# Patient Record
Sex: Female | Born: 1980
Health system: Southern US, Community
[De-identification: ages and names within clinical notes are randomized; demographics above are authoritative.]

## PROBLEM LIST (undated history)

## (undated) DIAGNOSIS — E039 Hypothyroidism, unspecified: Secondary | ICD-10-CM

## (undated) DIAGNOSIS — E119 Type 2 diabetes mellitus without complications: Secondary | ICD-10-CM

## (undated) DIAGNOSIS — F99 Mental disorder, not otherwise specified: Secondary | ICD-10-CM

## (undated) DIAGNOSIS — F329 Major depressive disorder, single episode, unspecified: Secondary | ICD-10-CM

## (undated) DIAGNOSIS — Z7901 Long term (current) use of anticoagulants: Secondary | ICD-10-CM

## (undated) DIAGNOSIS — Z862 Personal history of diseases of the blood and blood-forming organs and certain disorders involving the immune mechanism: Secondary | ICD-10-CM

## (undated) DIAGNOSIS — I10 Essential (primary) hypertension: Secondary | ICD-10-CM

## (undated) DIAGNOSIS — M1A9XX0 Chronic gout, unspecified, without tophus (tophi): Secondary | ICD-10-CM

## (undated) DIAGNOSIS — Z973 Presence of spectacles and contact lenses: Secondary | ICD-10-CM

## (undated) DIAGNOSIS — F4310 Post-traumatic stress disorder, unspecified: Secondary | ICD-10-CM

## (undated) DIAGNOSIS — F32A Depression, unspecified: Secondary | ICD-10-CM

## (undated) DIAGNOSIS — E785 Hyperlipidemia, unspecified: Secondary | ICD-10-CM

## (undated) HISTORY — DX: Essential (primary) hypertension: I10

## (undated) HISTORY — DX: Hypothyroidism, unspecified: E03.9

## (undated) HISTORY — DX: Type 2 diabetes mellitus without complications: E11.9

## (undated) HISTORY — DX: Mental disorder, not otherwise specified: F99

## (undated) HISTORY — DX: Hyperlipidemia, unspecified: E78.5

## (undated) HISTORY — DX: Depression, unspecified: F32.A

---
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
## 2006-06-07 ENCOUNTER — Emergency Department (HOSPITAL_COMMUNITY): Admission: EM | Admit: 2006-06-07 | Discharge: 2006-06-07 | Payer: Self-pay | Admitting: Emergency Medicine

## 2006-12-22 ENCOUNTER — Ambulatory Visit (HOSPITAL_COMMUNITY): Admission: RE | Admit: 2006-12-22 | Discharge: 2006-12-22 | Payer: Self-pay | Admitting: Obstetrics and Gynecology

## 2007-04-20 ENCOUNTER — Inpatient Hospital Stay (HOSPITAL_COMMUNITY): Admission: AD | Admit: 2007-04-20 | Discharge: 2007-04-23 | Payer: Self-pay | Admitting: Obstetrics & Gynecology

## 2007-04-20 ENCOUNTER — Ambulatory Visit: Payer: Self-pay | Admitting: Advanced Practice Midwife

## 2010-06-11 ENCOUNTER — Other Ambulatory Visit: Admission: RE | Admit: 2010-06-11 | Discharge: 2010-06-11 | Payer: Self-pay | Admitting: Obstetrics and Gynecology

## 2010-12-10 NOTE — Discharge Summary (Signed)
NAMEALLANNAH, KEMPEN             ACCOUNT NO.:  [id number]   MEDICAL RECORD NO.:  [id number]          PATIENT TYPE:  INP   LOCATION:  9167                          FACILITY:  [facility]   PHYSICIAN:  Allie Bossier, MD        DATE OF BIRTH:  1981-03-27   DATE OF ADMISSION:  04/20/2007  DATE OF DISCHARGE:                               DISCHARGE SUMMARY   CHIEF COMPLAINT:  Induction of labor secondary to gestational  hypertension.   HISTORY OF PRESENT ILLNESS:  April Carney is a 30 year old gravida 1, para 0  with an EDC of April 27, 2007, based on second trimester  ultrasounds, placing her at [redacted] weeks gestation.  She began prenatal care  at 17 weeks and has regular visits since then.   Prenatal labs include blood type A+, antibody negative, Rubella immune,  HBSAG, HIV, RPR, gonorrhea, Chlamydia, and HSV are all negative.  She is  Group B strep positive.  A one-hour GGT was normal at 126.  MFAFP was  also normal.   Blood pressures prenatally have been 110s-120s/70s-80s.  Total weight  gain has been from 251 to 290 with an elevated fundal height of 42 cm  today.   PAST MEDICAL HISTORY:  Noncontributory.   ALLERGIES:  None.   No medications except for iron daily.   SOCIAL HISTORY:  Single.  Works at [employer].  Denies smoking, drug use,  or alcohol use.  Father of the baby is present and involved.   FAMILY HISTORY:  Positive for hypertension, diabetes, coronary artery  disease, and cancer.   PHYSICAL EXAMINATION:  HEENT:  Within normal limits.  Denies visual  changes or disturbances.  HEART:  Regular rate and rhythm.  LUNGS:  Clear.  ABDOMEN:  Soft and nontender.  Fundal height is 42 cm.  NST was  reactive.  PELVIC:  Cervical, 2 cm soft, -1 station.  LEGS:  Edema 2+.  DTRs are 2+.  Blood pressures 170/108, 172/104.   Urinalysis reveals trace protein.   IMPRESSION:  Intrauterine pregnancy at 39 weeks, gestational  hypertension.   PLAN:  Will draw preeclampsia labs and induce  labor.  A Foley bulb was  placed in the cervix and inflated with 60 cc of sterile water.  A  reactive non-stress test was obtained.  The patient was instructed to go  to labor and delivery in four hours, where we will start GBS prophylaxis  and Pitocin.  If her blood pressures are still elevated, we will also  begin magnesium sulfate prophylaxis.      Jacklyn Shell, C.N.M.      Allie Bossier, MD  Electronically Signed    FC/MEDQ  D:  04/20/2007  T:  04/20/2007  Job:  [id number]   cc:   Francoise Schaumann. Milford Cage DO, FAAP  Fax: [phone]

## 2011-05-08 LAB — CBC
HCT: 32.6 — ABNORMAL LOW
Hemoglobin: 11.2 — ABNORMAL LOW
Hemoglobin: 9.4 — ABNORMAL LOW
MCHC: 34.2
MCV: 82
Platelets: 344
RBC: 3.98
RDW: 20.4 — ABNORMAL HIGH
WBC: 12.1 — ABNORMAL HIGH

## 2011-05-08 LAB — RPR: RPR Ser Ql: NONREACTIVE

## 2011-05-08 LAB — COMPREHENSIVE METABOLIC PANEL
Albumin: 2.8 — ABNORMAL LOW
Alkaline Phosphatase: 139 — ABNORMAL HIGH
BUN: 4 — ABNORMAL LOW
CO2: 21
Chloride: 109
GFR calc non Af Amer: >60
Glucose, Bld: 92
Potassium: 4.6
Total Bilirubin: 0.9

## 2011-08-08 ENCOUNTER — Other Ambulatory Visit (HOSPITAL_COMMUNITY)
Admission: RE | Admit: 2011-08-08 | Discharge: 2011-08-08 | Disposition: A | Discharge status: Home / Self Care | Payer: Medicaid Other | Source: Ambulatory Visit | Attending: Obstetrics and Gynecology | Admitting: Obstetrics and Gynecology

## 2011-08-08 DIAGNOSIS — Z113 Encounter for screening for infections with a predominantly sexual mode of transmission: Secondary | ICD-10-CM | POA: Insufficient documentation

## 2011-08-08 DIAGNOSIS — Z01419 Encounter for gynecological examination (general) (routine) without abnormal findings: Secondary | ICD-10-CM | POA: Insufficient documentation

## 2012-05-06 ENCOUNTER — Inpatient Hospital Stay (HOSPITAL_COMMUNITY)
Admission: AD | Admit: 2012-05-06 | Payer: Medicaid Other | Source: Home / Self Care | Admitting: Obstetrics & Gynecology

## 2013-12-23 ENCOUNTER — Emergency Department (HOSPITAL_COMMUNITY): Payer: Medicaid Other

## 2013-12-23 ENCOUNTER — Encounter (HOSPITAL_COMMUNITY): Payer: Self-pay | Admitting: Emergency Medicine

## 2013-12-23 ENCOUNTER — Emergency Department (HOSPITAL_COMMUNITY)
Admission: EM | Admit: 2013-12-23 | Discharge: 2013-12-23 | Disposition: A | Discharge status: Home / Self Care | Payer: Medicaid Other | Attending: Emergency Medicine | Admitting: Emergency Medicine

## 2013-12-23 DIAGNOSIS — Z3202 Encounter for pregnancy test, result negative: Secondary | ICD-10-CM | POA: Insufficient documentation

## 2013-12-23 DIAGNOSIS — Z862 Personal history of diseases of the blood and blood-forming organs and certain disorders involving the immune mechanism: Secondary | ICD-10-CM | POA: Insufficient documentation

## 2013-12-23 DIAGNOSIS — E119 Type 2 diabetes mellitus without complications: Secondary | ICD-10-CM | POA: Insufficient documentation

## 2013-12-23 DIAGNOSIS — Z8639 Personal history of other endocrine, nutritional and metabolic disease: Secondary | ICD-10-CM | POA: Insufficient documentation

## 2013-12-23 DIAGNOSIS — I252 Old myocardial infarction: Secondary | ICD-10-CM | POA: Insufficient documentation

## 2013-12-23 DIAGNOSIS — R002 Palpitations: Secondary | ICD-10-CM

## 2013-12-23 DIAGNOSIS — R42 Dizziness and giddiness: Secondary | ICD-10-CM | POA: Insufficient documentation

## 2013-12-23 HISTORY — DX: Hypothyroidism, unspecified: E03.9

## 2013-12-23 LAB — URINALYSIS, ROUTINE W REFLEX MICROSCOPIC
Bilirubin Urine: NEGATIVE
GLUCOSE, UA: NEGATIVE mg/dL
Hgb urine dipstick: NEGATIVE
KETONES UR: NEGATIVE mg/dL
LEUKOCYTES UA: NEGATIVE
NITRITE: NEGATIVE
PH: 7 (ref 5.0–8.0)
Protein, ur: NEGATIVE mg/dL
SPECIFIC GRAVITY, URINE: 1.015 (ref 1.005–1.030)
Urobilinogen, UA: 0.2 mg/dL (ref 0.0–1.0)

## 2013-12-23 LAB — BASIC METABOLIC PANEL
BUN: 12 mg/dL (ref 6–23)
CHLORIDE: 103 meq/L (ref 96–112)
CO2: 26 meq/L (ref 19–32)
Calcium: 9.7 mg/dL (ref 8.4–10.5)
Creatinine, Ser: 0.68 mg/dL (ref 0.50–1.10)
GFR calc Af Amer: >90 mL/min (ref >90)
GFR calc non Af Amer: >90 mL/min (ref >90)
GLUCOSE: 121 mg/dL — AB (ref 70–99)
POTASSIUM: 4.7 meq/L (ref 3.7–5.3)
SODIUM: 141 meq/L (ref 137–147)

## 2013-12-23 LAB — PREGNANCY, URINE: Preg Test, Ur: NEGATIVE

## 2013-12-23 LAB — CBC WITH DIFFERENTIAL/PLATELET
Basophils Absolute: 0 10*3/uL (ref 0.0–0.1)
Basophils Relative: 0 % (ref 0–1)
EOS ABS: 0.1 10*3/uL (ref 0.0–0.7)
Eosinophils Relative: 1 % (ref 0–5)
HCT: 35.9 % — ABNORMAL LOW (ref 36.0–46.0)
HEMOGLOBIN: 10.9 g/dL — AB (ref 12.0–15.0)
LYMPHS ABS: 1.9 10*3/uL (ref 0.7–4.0)
LYMPHS PCT: 29 % (ref 12–46)
MCH: 24.8 pg — AB (ref 26.0–34.0)
MCHC: 30.4 g/dL (ref 30.0–36.0)
MCV: 81.8 fL (ref 78.0–100.0)
MONOS PCT: 7 % (ref 3–12)
Monocytes Absolute: 0.4 10*3/uL (ref 0.1–1.0)
NEUTROS ABS: 3.9 10*3/uL (ref 1.7–7.7)
NEUTROS PCT: 63 % (ref 43–77)
PLATELETS: 407 10*3/uL — AB (ref 150–400)
RBC: 4.39 MIL/uL (ref 3.87–5.11)
RDW: 13.9 % (ref 11.5–15.5)
WBC: 6.3 10*3/uL (ref 4.0–10.5)

## 2013-12-23 LAB — TROPONIN I: Troponin I: <0.3 ng/mL (ref <0.30)

## 2013-12-23 NOTE — ED Provider Notes (Signed)
CSN: [id number]     Arrival date & time 12/23/13  1016 History  This chart was scribed for Benny Lennert, MD by Shari Heritage, ED Scribe. The patient was seen in room APA14/APA14. Patient's care was started at 2:07 PM.    Chief Complaint  Patient presents with  . Tachycardia    Patient is a 33 y.o. female presenting with palpitations. The history is provided by the patient. No language interpreter was used.  Palpitations Palpitations quality:  Fast Onset quality:  Unable to specify Duration:  30 days Timing:  Intermittent Chronicity:  New Associated symptoms: no back pain, no chest pain and no cough   Risk factors: no heart disease    HPI Comments: Kristeen L Ozga is a 33 y.o. female who presents to the Emergency Department complaining of intermittent palpitations over the past 30 days. Pt has not been successful in checking her pulse. Pt states she does not have a PCP and has not seen anyone for this problem. She has also been experiencing intermittent lightheadedness and diziness. She denies any chest pain, SOB or any other symptoms at this time. Her father died of MI at the age of 13. He had multiple prior MI's and a history of diabetes and also smoked cigarettes for several decades.   Past Medical History  Diagnosis Date  . Hypoactive thyroid    Past Surgical History  Procedure Laterality Date  . Cesarean section     No family history on file. History  Substance Use Topics  . Smoking status: Never Smoker   . Smokeless tobacco: Not on file  . Alcohol Use: Yes     Comment: occasionally   OB History   Grav Para Term Preterm Abortions TAB SAB Ect Mult Living            1     Review of Systems  Constitutional: Negative for appetite change and fatigue.  HENT: Negative for congestion, ear discharge and sinus pressure.   Eyes: Negative for discharge.  Respiratory: Negative for cough.   Cardiovascular: Positive for palpitations. Negative for chest pain.  Gastrointestinal:  Negative for abdominal pain and diarrhea.  Genitourinary: Negative for frequency and hematuria.  Musculoskeletal: Negative for back pain.  Skin: Negative for rash.  Neurological: Negative for seizures and headaches.  Psychiatric/Behavioral: Negative for hallucinations.    Allergies  Review of patient's allergies indicates no known allergies.  Home Medications   Prior to Admission medications   Medication Sig Start Date End Date Taking? Authorizing Provider  ibuprofen (ADVIL,MOTRIN) 200 MG tablet Take 600 mg by mouth every 8 (eight) hours as needed for moderate pain.   Yes Historical Provider, MD   Triage Vitals: BP 136/95  Pulse 78  Temp(Src) 99 F (37.2 C)  Resp 20  Ht 5\' 5"  (1.651 m)  Wt 289 lb 5 oz (131.231 kg)  BMI 48.14 kg/m2  SpO2 100% Physical Exam  Constitutional: She is oriented to person, place, and time. She appears well-developed.  HENT:  Head: Normocephalic.  Eyes: Conjunctivae and EOM are normal. No scleral icterus.  Neck: Neck supple. No thyromegaly present.  Cardiovascular: Normal rate and regular rhythm.  Exam reveals no gallop and no friction rub.   No murmur heard. Pulmonary/Chest: No stridor. She has no wheezes. She has no rales. She exhibits no tenderness.  Abdominal: She exhibits no distension. There is no tenderness. There is no rebound.  Musculoskeletal: Normal range of motion. She exhibits no edema.  Lymphadenopathy:    She  has no cervical adenopathy.  Neurological: She is oriented to person, place, and time. She exhibits normal muscle tone. Coordination normal.  Skin: No rash noted. No erythema.  Psychiatric: She has a normal mood and affect. Her behavior is normal.    ED Course  Procedures (including critical care time) DIAGNOSTIC STUDIES: Oxygen Saturation is 100% on room air, normal by my interpretation.    COORDINATION OF CARE: 2:15 PM- Will order CBC with diff, CMP, UA, and troponin. Patient informed of current plan for treatment and  evaluation and agrees with plan at this time.      Labs Review Labs Reviewed  CBC WITH DIFFERENTIAL - Abnormal; Notable for the following:    Hemoglobin 10.9 (*)    HCT 35.9 (*)    MCH 24.8 (*)    Platelets 407 (*)    All other components within normal limits  BASIC METABOLIC PANEL - Abnormal; Notable for the following:    Glucose, Bld 121 (*)    All other components within normal limits  URINALYSIS, ROUTINE W REFLEX MICROSCOPIC  PREGNANCY, URINE    Imaging Review No results found.   EKG Interpretation   Date/Time:  Friday Dec 23 2013 10:46:30 EDT Ventricular Rate:  68 PR Interval:  168 QRS Duration: 98 QT Interval:  410 QTC Calculation: 435 R Axis:   6 Text Interpretation:  Normal sinus rhythm with sinus arrhythmia Possible  Anterolateral infarct , age undetermined Otherwise within normal limits No  old tracing to compare Confirmed by KNAPP  MD-I, IVA ([id number]) on 12/23/2013  1:22:19 PM      MDM   Final diagnoses:  None   Palpitaions.  The chart was scribed for me under my direct supervision.  I personally performed the history, physical, and medical decision making and all procedures in the evaluation of this patient.Benny Lennert.   Taseen Marasigan L Tanna Loeffler, MD 12/23/13 [phone]

## 2013-12-23 NOTE — Care Management Note (Signed)
ED/CM noted patient did not have health insurance and/or PCP listed in the computer.  Patient was given the [place] resource handout with information on the clinics, food pantries, and the handout for new health insurance sign-up.  Patient expressed appreciation for information received. 

## 2013-12-23 NOTE — Discharge Instructions (Signed)
Follow up for recheck with a family md in 1-2 weeks.  Return as needed

## 2013-12-23 NOTE — ED Notes (Signed)
Intermittent rapid heart beat, dizziness for last 30 days.

## 2015-04-10 ENCOUNTER — Encounter: Payer: Self-pay | Admitting: Women's Health

## 2015-04-10 ENCOUNTER — Ambulatory Visit (INDEPENDENT_AMBULATORY_CARE_PROVIDER_SITE_OTHER): Payer: Medicaid Other | Admitting: Women's Health

## 2015-04-10 VITALS — BP 120/92 | HR 64 | Wt 289.0 lb

## 2015-04-10 DIAGNOSIS — Z3049 Encounter for surveillance of other contraceptives: Secondary | ICD-10-CM | POA: Diagnosis not present

## 2015-04-10 DIAGNOSIS — Z3046 Encounter for surveillance of implantable subdermal contraceptive: Secondary | ICD-10-CM

## 2015-04-10 NOTE — Progress Notes (Signed)
[name], female   DOB: 10/03/1980, 34 y.o.   MRN: [id number] April Carney is a 34 y.o. year old Caucasian female here for Nexplanon removal.  Patient given informed consent for removal of her Nexplanon. Was placed 2013 after birth of child. Doesn't want any contraception at this time, 'if pregnancy happens, it happens'. Needs pap.   BP 120/92 mmHg  Pulse 64  Wt 289 lb (131.09 kg)  Appropriate time out taken. Nexplanon site identified.  Area prepped in usual sterile fashon. One cc of 2% lidocaine was used to anesthetize the area at the distal end of the implant. A small stab incision was made right beside the implant on the distal portion.  The Nexplanon rod was grasped using hemostats and removed without difficulty.  There was less than 3 cc blood loss. There were no complications.  Steri-strips were applied over the small incision and a pressure bandage was applied.  The patient tolerated the procedure well.  She was instructed to keep the area clean and dry, remove pressure bandage in 24 hours, and keep insertion site covered with the steri-strip for 3-5 days.    Follow-up 2wks for pap & physical Begin taking pnv daily  Marge Duncans CNM, [facility] 04/10/2015 12:27 PM

## 2015-04-10 NOTE — Patient Instructions (Signed)
Keep the area clean and dry.  You can remove the big bandage in 24 hours, and the small steri-strip bandage in 3-5 days.   Begin taking prenatal vitamins daily

## 2015-04-24 ENCOUNTER — Other Ambulatory Visit (HOSPITAL_COMMUNITY)
Admission: RE | Admit: 2015-04-24 | Discharge: 2015-04-24 | Disposition: A | Discharge status: Home / Self Care | Payer: Medicaid Other | Source: Ambulatory Visit | Attending: Obstetrics & Gynecology | Admitting: Obstetrics & Gynecology

## 2015-04-24 ENCOUNTER — Ambulatory Visit (INDEPENDENT_AMBULATORY_CARE_PROVIDER_SITE_OTHER): Payer: Medicaid Other | Admitting: Women's Health

## 2015-04-24 ENCOUNTER — Encounter: Payer: Self-pay | Admitting: Women's Health

## 2015-04-24 VITALS — BP 122/74 | HR 64 | Ht 64.5 in | Wt 280.0 lb

## 2015-04-24 DIAGNOSIS — E039 Hypothyroidism, unspecified: Secondary | ICD-10-CM | POA: Insufficient documentation

## 2015-04-24 DIAGNOSIS — Z113 Encounter for screening for infections with a predominantly sexual mode of transmission: Secondary | ICD-10-CM | POA: Insufficient documentation

## 2015-04-24 DIAGNOSIS — Z98891 History of uterine scar from previous surgery: Secondary | ICD-10-CM

## 2015-04-24 DIAGNOSIS — Q442 Atresia of bile ducts: Secondary | ICD-10-CM

## 2015-04-24 DIAGNOSIS — Z1151 Encounter for screening for human papillomavirus (HPV): Secondary | ICD-10-CM | POA: Insufficient documentation

## 2015-04-24 DIAGNOSIS — E669 Obesity, unspecified: Secondary | ICD-10-CM

## 2015-04-24 DIAGNOSIS — Z0000 Encounter for general adult medical examination without abnormal findings: Secondary | ICD-10-CM | POA: Diagnosis not present

## 2015-04-24 DIAGNOSIS — Z8639 Personal history of other endocrine, nutritional and metabolic disease: Secondary | ICD-10-CM

## 2015-04-24 DIAGNOSIS — Z01419 Encounter for gynecological examination (general) (routine) without abnormal findings: Secondary | ICD-10-CM | POA: Insufficient documentation

## 2015-04-24 DIAGNOSIS — Z8751 Personal history of pre-term labor: Secondary | ICD-10-CM

## 2015-04-24 MED ORDER — NYSTATIN 100000 UNIT/GM EX POWD: Freq: Two times a day (BID) | CUTANEOUS | Status: DC | PRN

## 2015-04-24 NOTE — Progress Notes (Addendum)
[name], female   DOB: Jun 02, 1981, 34 y.o.   MRN: [id number] Subjective:   April Carney is a 34 y.o. G35P1102 Caucasian female here for a routine well-woman exam.  No LMP recorded. Patient has had an implant.   Nexplanon recently removed 04/10/15, hasn't started period yet. Has begun taking pnv in case pregnancy occurs.  Current complaints: none. Taking pcn for upcoming root canal in oct PCP: none       H/O hypothyroidism years ago, quit taking meds on her own- can't remember why Does desire labs, including STD  Social History: Sexual: heterosexual Marital Status: single, dating Living situation: with her children Occupation: unemployed Tobacco/alcohol: no tobacco, etoh: occ Illicit drugs: no history of illicit drug use  The following portions of the patient's history were reviewed and updated as appropriate: allergies, current medications, past family history, past medical history, past social history, past surgical history and problem list.  Past Medical History Past Medical History  Diagnosis Date  . Hypoactive thyroid     Past Surgical History Past Surgical History  Procedure Laterality Date  . Cesarean section      Gynecologic History G0P0  No LMP recorded. Patient has had an implant. Contraception: none, nexplanon recently removed, 'if pregnancy happens, it happens' Last Pap: 2013. Results were: normal Last mammogram: never. Results were: n/a Last TCS: never  Obstetric History OB History  Gravida Para Term Preterm AB SAB TAB Ectopic Multiple Living           1        Current Medications Current Outpatient Prescriptions on File Prior to Visit  Medication Sig Dispense Refill  . ibuprofen (ADVIL,MOTRIN) 200 MG tablet Take 600 mg by mouth every 8 (eight) hours as needed for moderate pain.     No current facility-administered medications on file prior to visit.    Review of Systems Patient denies any headaches, blurred vision, shortness  of breath, chest pain, abdominal pain, problems with bowel movements, urination, or intercourse.  Objective:  BP 122/74 mmHg  Pulse 64  Ht 5' 4.5" (1.638 m)  Wt 280 lb (127.007 kg)  BMI 47.34 kg/m2 Physical Exam  General:  Well developed, well nourished, no acute distress. She is alert and oriented x3. Skin:  Warm and dry Neck:  Midline trachea, no thyromegaly or nodules Cardiovascular: Regular rate and rhythm, no murmur heard Lungs:  Effort normal, all lung fields clear to auscultation bilaterally Breasts:  No dominant palpable mass, retraction, or nipple discharge, large erythematous yeast patch under Rt breast- painted w/ gentian violet Abdomen:  Soft, non tender, no hepatosplenomegaly or masses, large discoloration under entire length of panus from chronic yeast- no active yeast at present Pelvic:  External genitalia is normal in appearance.  The vagina is normal in appearance. The cervix is bulbous w/ nabothian cysts, no CMT.  Thin prep pap is done w/ HR HPV cotesting. Difficult to assess uterus/adnexa d/t body habitus- no tenderness w/ palpation Extremities:  No swelling or varicosities noted Psych:  She has a normal mood and affect  Assessment:   Healthy well-woman exam Obesity Skin candida- currently Rt breast, but chronic under both breasts, panus H/O hypothyroidism  Plan:  GC/CT from pap, HIV, RPR, HepB, CBC, CMP, TSH, A1C today Rx nystatin powder bid prn for skin candida Recommended losing weight prior to pregnancy: decrease carbs/sodas, increase exercise F/U 34yr for physical, or sooner if needed or becomes pregnant Mammogram  or sooner if problems Colonoscopy  or sooner  if problems  Marge Duncans CNM, [facility] 04/24/2015 9:09 AM

## 2015-04-25 ENCOUNTER — Telehealth: Payer: Self-pay | Admitting: Women's Health

## 2015-04-25 LAB — COMPREHENSIVE METABOLIC PANEL
A/G RATIO: 1.8 (ref 1.1–2.5)
ALBUMIN: 4.9 g/dL (ref 3.5–5.5)
ALT: 26 IU/L (ref 0–32)
AST: 21 IU/L (ref 0–40)
Alkaline Phosphatase: 64 IU/L (ref 39–117)
BUN/Creatinine Ratio: 9 (ref 8–20)
BUN: 7 mg/dL (ref 6–20)
Bilirubin Total: 0.7 mg/dL (ref 0.0–1.2)
CALCIUM: 10.2 mg/dL (ref 8.7–10.2)
CO2: 21 mmol/L (ref 18–29)
CREATININE: 0.77 mg/dL (ref 0.57–1.00)
Chloride: 97 mmol/L (ref 97–108)
GFR, EST AFRICAN AMERICAN: 119 mL/min/{1.73_m2} (ref >59)
GFR, EST NON AFRICAN AMERICAN: 103 mL/min/{1.73_m2} (ref >59)
GLOBULIN, TOTAL: 2.8 g/dL (ref 1.5–4.5)
Glucose: 107 mg/dL — ABNORMAL HIGH (ref 65–99)
POTASSIUM: 5.1 mmol/L (ref 3.5–5.2)
SODIUM: 138 mmol/L (ref 134–144)
TOTAL PROTEIN: 7.7 g/dL (ref 6.0–8.5)

## 2015-04-25 LAB — CBC
HEMATOCRIT: 40.4 % (ref 34.0–46.6)
HEMOGLOBIN: 13.6 g/dL (ref 11.1–15.9)
MCH: 28.9 pg (ref 26.6–33.0)
MCHC: 33.7 g/dL (ref 31.5–35.7)
MCV: 86 fL (ref 79–97)
Platelets: 396 10*3/uL — ABNORMAL HIGH (ref 150–379)
RBC: 4.71 x10E6/uL (ref 3.77–5.28)
RDW: 13.6 % (ref 12.3–15.4)
WBC: 10.1 10*3/uL (ref 3.4–10.8)

## 2015-04-25 LAB — RPR: RPR Ser Ql: NONREACTIVE

## 2015-04-25 LAB — TSH: TSH: 11.2 u[IU]/mL — ABNORMAL HIGH (ref 0.450–4.500)

## 2015-04-25 LAB — HIV ANTIBODY (ROUTINE TESTING W REFLEX): HIV SCREEN 4TH GENERATION: NONREACTIVE

## 2015-04-25 LAB — HEPATITIS B SURFACE ANTIGEN: Hepatitis B Surface Ag: NEGATIVE

## 2015-04-25 LAB — CYTOLOGY - PAP

## 2015-04-25 NOTE — Telephone Encounter (Signed)
error 

## 2015-04-30 LAB — SPECIMEN STATUS REPORT

## 2015-05-01 ENCOUNTER — Other Ambulatory Visit: Payer: Self-pay | Admitting: Women's Health

## 2015-05-01 ENCOUNTER — Telehealth: Payer: Self-pay | Admitting: Women's Health

## 2015-05-01 MED ORDER — LEVOTHYROXINE SODIUM 50 MCG PO TABS: 50.0000 ug | ORAL_TABLET | Freq: Every day | ORAL | Status: DC

## 2015-05-01 NOTE — Telephone Encounter (Signed)
LM for pt to return call, need to notify of labs, rx.  [name], CNM, [facility] 05/01/2015 4:39 PM

## 2015-05-02 ENCOUNTER — Telehealth: Payer: Self-pay | Admitting: Women's Health

## 2015-05-02 DIAGNOSIS — E039 Hypothyroidism, unspecified: Secondary | ICD-10-CM

## 2015-05-02 NOTE — Telephone Encounter (Signed)
Pt states that she is returning Kim's call from yesterday.

## 2015-05-02 NOTE — Telephone Encounter (Signed)
Pt returned call, notified her of all labs, rx synthroid daily, will recheck TSH in 6wks. A1C 5.9, recommended weight loss/exercise.  [name], CNM, [facility] 05/02/2015 10:38 AM

## 2015-05-03 LAB — HGB A1C W/O EAG: HEMOGLOBIN A1C: 5.9 % — AB (ref 4.8–5.6)

## 2015-05-03 LAB — SPECIMEN STATUS REPORT

## 2015-06-14 LAB — TSH: TSH: 8.76 u[IU]/mL — ABNORMAL HIGH (ref 0.450–4.500)

## 2015-06-18 ENCOUNTER — Telehealth: Payer: Self-pay | Admitting: Women's Health

## 2015-06-18 DIAGNOSIS — E039 Hypothyroidism, unspecified: Secondary | ICD-10-CM

## 2015-06-18 MED ORDER — LEVOTHYROXINE SODIUM 75 MCG PO TABS: 75.0000 ug | ORAL_TABLET | Freq: Every day | ORAL | Status: DC

## 2015-06-18 NOTE — Telephone Encounter (Signed)
Notified pt TSH still elevated, increase synthroid to 6575mcg/daily, new rx sent, recheck TSH in 6wks.  Cheral MarkerKimberly R. Rosealynn Mateus, CNM, [facility] 06/18/2015 1:27 PM

## 2015-12-11 ENCOUNTER — Encounter: Payer: Self-pay | Admitting: Women's Health

## 2015-12-11 ENCOUNTER — Ambulatory Visit (INDEPENDENT_AMBULATORY_CARE_PROVIDER_SITE_OTHER): Payer: Medicaid Other | Admitting: Women's Health

## 2015-12-11 VITALS — BP 138/88 | HR 68 | Wt 295.0 lb

## 2015-12-11 DIAGNOSIS — Z3202 Encounter for pregnancy test, result negative: Secondary | ICD-10-CM | POA: Diagnosis not present

## 2015-12-11 DIAGNOSIS — F4321 Adjustment disorder with depressed mood: Secondary | ICD-10-CM

## 2015-12-11 DIAGNOSIS — Z6841 Body Mass Index (BMI) 40.0 and over, adult: Secondary | ICD-10-CM

## 2015-12-11 DIAGNOSIS — E039 Hypothyroidism, unspecified: Secondary | ICD-10-CM

## 2015-12-11 DIAGNOSIS — E669 Obesity, unspecified: Secondary | ICD-10-CM

## 2015-12-11 DIAGNOSIS — Z30011 Encounter for initial prescription of contraceptive pills: Secondary | ICD-10-CM

## 2015-12-11 MED ORDER — NORETHIN-ETH ESTRAD-FE BIPHAS 1 MG-10 MCG / 10 MCG PO TABS: 1.0000 | ORAL_TABLET | Freq: Every day | ORAL | Status: DC

## 2015-12-11 NOTE — Patient Instructions (Signed)
Oral Contraception Use Oral contraceptive pills (OCPs) are medicines taken to prevent pregnancy. OCPs work by preventing the ovaries from releasing eggs. The hormones in OCPs also cause the cervical mucus to thicken, preventing the sperm from entering the uterus. The hormones also cause the uterine lining to become thin, not allowing a fertilized egg to attach to the inside of the uterus. OCPs are highly effective when taken exactly as prescribed. However, OCPs do not prevent sexually transmitted diseases (STDs). Safe sex practices, such as using condoms along with an OCP, can help prevent STDs. Before taking OCPs, you may have a physical exam and Pap test. Your health care provider may also order blood tests if necessary. Your health care provider will make sure you are a good candidate for oral contraception. Discuss with your health care provider the possible side effects of the OCP you may be prescribed. When starting an OCP, it can take 2 to 3 months for the body to adjust to the changes in hormone levels in your body.  HOW TO TAKE ORAL CONTRACEPTIVE PILLS Your health care provider may advise you on how to start taking the first cycle of OCPs. Otherwise, you can:   Start on day 1 of your menstrual period. You will not need any backup contraceptive protection with this start time.   Start on the first Sunday after your menstrual period or the day you get your prescription. In these cases, you will need to use backup contraceptive protection for the first week.   Start the pill at any time of your cycle. If you take the pill within 5 days of the start of your period, you are protected against pregnancy right away. In this case, you will not need a backup form of birth control. If you start at any other time of your menstrual cycle, you will need to use another form of birth control for 7 days. If your OCP is the type called a minipill, it will protect you from pregnancy after taking it for 2 days (48  hours). After you have started taking OCPs:   If you forget to take 1 pill, take it as soon as you remember. Take the next pill at the regular time.   If you miss 2 or more pills, call your health care provider because different pills have different instructions for missed doses. Use backup birth control until your next menstrual period starts.   If you use a 28-day pack that contains inactive pills and you miss 1 of the last 7 pills (pills with no hormones), it will not matter. Throw away the rest of the non-hormone pills and start a new pill pack.  No matter which day you start the OCP, you will always start a new pack on that same day of the week. Have an extra pack of OCPs and a backup contraceptive method available in case you miss some pills or lose your OCP pack.  HOME CARE INSTRUCTIONS   Do not smoke.   Always use a condom to protect against STDs. OCPs do not protect against STDs.   Use a calendar to mark your menstrual period days.   Read the information and directions that came with your OCP. Talk to your health care provider if you have questions.  SEEK MEDICAL CARE IF:   You develop nausea and vomiting.   You have abnormal vaginal discharge or bleeding.   You develop a rash.   You miss your menstrual period.   You are losing   your hair.   You need treatment for mood swings or depression.   You get dizzy when taking the OCP.   You develop acne from taking the OCP.   You become pregnant.  SEEK IMMEDIATE MEDICAL CARE IF:   You develop chest pain.   You develop shortness of breath.   You have an uncontrolled or severe headache.   You develop numbness or slurred speech.   You develop visual problems.   You develop pain, redness, and swelling in the legs.    This information is not intended to replace advice given to you by your health care provider. Make sure you discuss any questions you have with your health care provider.   Document  Released: 07/03/2011 Document Revised: 08/04/2014 Document Reviewed: 01/02/2013 Elsevier Interactive Patient Education 2016 Elsevier Inc.  Ethinyl Estradiol; Norethindrone Acetate tablets (contraception) What is this medicine? ETHINYL ESTRADIOL; NORETHINDRONE ACETATE (ETH in il es tra DYE ole; nor eth IN drone AS e tate) is an oral contraceptive. The products combine two types of female hormones, an estrogen and a progestin. They are used to prevent ovulation and pregnancy. This medicine may be used for other purposes; ask your health care provider or pharmacist if you have questions. What should I tell my health care provider before I take this medicine? They need to know if you have or ever had any of these conditions: -abnormal vaginal bleeding -blood vessel disease or blood clots -breast, cervical, endometrial, ovarian, liver, or uterine cancer -diabetes -gallbladder disease -heart disease or recent heart attack -high blood pressure -high cholesterol -kidney disease -liver disease -migraine headaches -stroke -systemic lupus erythematosus (SLE) -tobacco smoker -an unusual or allergic reaction to estrogens, progestins, other medicines, foods, dyes, or preservatives -pregnant or trying to get pregnant -breast-feeding How should I use this medicine? Take this medicine by mouth. To reduce nausea, this medicine may be taken with food. Follow the directions on the prescription label. Take this medicine at the same time each day and in the order directed on the package. Do not take your medicine more often than directed. Contact your pediatrician regarding the use of this medicine in children. Special care may be needed. This medicine has been used in female children who have started having menstrual periods. A patient package insert for the product will be given with each prescription and refill. Read this sheet carefully each time. The sheet may change frequently. Overdosage: If you  think you have taken too much of this medicine contact a poison control center or emergency room at once. NOTE: This medicine is only for you. Do not share this medicine with others. What if I miss a dose? If you miss a dose, refer to the patient information sheet you received with your medicine for direction. If you miss more than one pill, this medicine may not be as effective and you may need to use another form of birth control. What may interact with this medicine? -acetaminophen -antibiotics or medicines for infections, especially rifampin, rifabutin, rifapentine, and griseofulvin, and possibly penicillins or tetracyclines -aprepitant -ascorbic acid (vitamin C) -atorvastatin -barbiturate medicines, such as phenobarbital -bosentan -carbamazepine -caffeine -clofibrate -cyclosporine -dantrolene -doxercalciferol -felbamate -grapefruit juice -hydrocortisone -medicines for anxiety or sleeping problems, such as diazepam or temazepam -medicines for diabetes, including pioglitazone -mineral oil -modafinil -mycophenolate -nefazodone -oxcarbazepine -phenytoin -prednisolone -ritonavir or other medicines for HIV infection or AIDS -rosuvastatin -selegiline -soy isoflavones supplements -St. John's wort -tamoxifen or raloxifene -theophylline -thyroid hormones -topiramate -warfarin This list may not describe all   possible interactions. Give your health care provider a list of all the medicines, herbs, non-prescription drugs, or dietary supplements you use. Also tell them if you smoke, drink alcohol, or use illegal drugs. Some items may interact with your medicine. What should I watch for while using this medicine? Visit your doctor or health care professional for regular checks on your progress. You will need a regular breast and pelvic exam and Pap smear while on this medicine. Use an additional method of contraception during the first cycle that you take these tablets. If you have  any reason to think you are pregnant, stop taking this medicine right away and contact your doctor or health care professional. If you are taking this medicine for hormone related problems, it may take several cycles of use to see improvement in your condition. Smoking increases the risk of getting a blood clot or having a stroke while you are taking birth control pills, especially if you are more than 33 years old. You are strongly advised not to smoke. This medicine can make your body retain fluid, making your fingers, hands, or ankles swell. Your blood pressure can go up. Contact your doctor or health care professional if you feel you are retaining fluid. This medicine can make you more sensitive to the sun. Keep out of the sun. If you cannot avoid being in the sun, wear protective clothing and use sunscreen. Do not use sun lamps or tanning beds/booths. If you wear contact lenses and notice visual changes, or if the lenses begin to feel uncomfortable, consult your eye care specialist. In some women, tenderness, swelling, or minor bleeding of the gums may occur. Notify your dentist if this happens. Brushing and flossing your teeth regularly may help limit this. See your dentist regularly and inform your dentist of the medicines you are taking. If you are going to have elective surgery, you may need to stop taking this medicine before the surgery. Consult your health care professional for advice. This medicine does not protect you against HIV infection (AIDS) or any other sexually transmitted diseases. What side effects may I notice from receiving this medicine? Side effects that you should report to your doctor or health care professional as soon as possible: -breast tissue changes or discharge -changes in vaginal bleeding during your period or between your periods -chest pain -coughing up blood -dizziness or fainting spells -headaches or migraines -leg, arm or groin pain -severe or sudden  headaches -stomach pain (severe) -sudden shortness of breath -sudden loss of coordination, especially on one side of the body -speech problems -symptoms of vaginal infection like itching, irritation or unusual discharge -tenderness in the upper abdomen -vomiting -weakness or numbness in the arms or legs, especially on one side of the body -yellowing of the eyes or skin Side effects that usually do not require medical attention (report to your doctor or health care professional if they continue or are bothersome): -breakthrough bleeding and spotting that continues beyond the 3 initial cycles of pills -breast tenderness -mood changes, anxiety, depression, frustration, anger, or emotional outbursts -increased sensitivity to sun or ultraviolet light -nausea -skin rash, acne, or brown spots on the skin -weight gain (slight) This list may not describe all possible side effects. Call your doctor for medical advice about side effects. You may report side effects to FDA at 1-800-FDA-1088. Where should I keep my medicine? Keep out of the reach of children. Store at room temperature between 15 and 30 degrees C (59 and 86 degrees F).   Throw away any unused medicine after the expiration date. NOTE: This sheet is a summary. It may not cover all possible information. If you have questions about this medicine, talk to your doctor, pharmacist, or health care provider.    2016, Elsevier/Gold Standard. (2012-11-19 15:35:20)  

## 2015-12-11 NOTE — Progress Notes (Signed)
[name] Westenberger, female   DOB: 1980-09-29, 35 y.o.   MRN: [id number]   [facility]  Patient name: April Carney Brim MRN [id number]  Date of birth: 1980-09-29  CC & HPI:  April Carney Gebert is a 35 y.o. [id number] Caucasian female presenting today wanting to get on birth control pills. Does not smoke, no h/o HTN, DVT/PE, CVA, MI, or migraines w/ aura. Had nexplanon removed 04/10/15. This last period that just started 5/13 has been heavy- changing pad q 2hrs for 1st 2 days so she wants something to control bleeding. She is no longer w/ her partner. She is having behavior problems w/ her sons and she feels she is depressed and angry all the time. Denies SI/HI. She does request referral to counseling. H/O hypothyroidism, put her back on synthroid 75mcg in Nov, she was supposed to return 6wk later for recheck of TSH, but didn't. She stopped synthroid again and is unsure of why, but has been back on it for 4wks and wants TSH and all other screening labs rechecked today.  Patient's last menstrual period was 12/08/2015. Last pap 04/24/15 neg w/ -HRHPV  Pertinent History Reviewed:  Medical & Surgical Hx:   Past medical, surgical, family, and social history reviewed in electronic medical record Medications: Reviewed & Updated - see associated section Allergies: Reviewed in electronic medical record  Objective Findings:  Vitals: BP 138/88 mmHg  Pulse 68  Wt 295 lb (133.811 kg)  LMP 12/08/2015 Body mass index is 49.87 kg/(m^2).  Physical Examination: General appearance - alert, cooperative, slightly teary when talking about stress/moods  No results found for this or any previous visit (from the past 24 hour(s)).   Assessment & Plan:  A:   Contraception management/period management  Hypothyroidism  Depression/stress/anger  Obesity  P:  Rx LoLoestrin 3pk w/ 3RF  Continue synthroid 75mcg daily  Check CBC, CMP, TSH, A1C, Lipids today  [facility]  [facility] faxed, call if haven't heard from them w/in 1wk  Return in about 3 months (around 03/12/2016) for F/U. or sooner if needed  Marge DuncansBooker, Ciel Chervenak Randall CNM, [facility] 12/11/2015 9:03 AM

## 2015-12-12 ENCOUNTER — Telehealth: Payer: Self-pay | Admitting: Women's Health

## 2015-12-12 DIAGNOSIS — R7303 Prediabetes: Secondary | ICD-10-CM

## 2015-12-12 DIAGNOSIS — R740 Nonspecific elevation of levels of transaminase and lactic acid dehydrogenase [LDH]: Secondary | ICD-10-CM

## 2015-12-12 DIAGNOSIS — R7401 Elevation of levels of liver transaminase levels: Secondary | ICD-10-CM

## 2015-12-12 DIAGNOSIS — E785 Hyperlipidemia, unspecified: Secondary | ICD-10-CM

## 2015-12-12 DIAGNOSIS — E119 Type 2 diabetes mellitus without complications: Secondary | ICD-10-CM | POA: Insufficient documentation

## 2015-12-12 DIAGNOSIS — E039 Hypothyroidism, unspecified: Secondary | ICD-10-CM

## 2015-12-12 LAB — COMPREHENSIVE METABOLIC PANEL
ALBUMIN: 4.3 g/dL (ref 3.5–5.5)
ALK PHOS: 60 IU/L (ref 39–117)
ALT: 35 IU/L — ABNORMAL HIGH (ref 0–32)
AST: 21 IU/L (ref 0–40)
Albumin/Globulin Ratio: 1.9 (ref 1.2–2.2)
BUN / CREAT RATIO: 10 (ref 9–23)
BUN: 7 mg/dL (ref 6–20)
Bilirubin Total: 0.5 mg/dL (ref 0.0–1.2)
CALCIUM: 9.3 mg/dL (ref 8.7–10.2)
CO2: 21 mmol/L (ref 18–29)
CREATININE: 0.67 mg/dL (ref 0.57–1.00)
Chloride: 100 mmol/L (ref 96–106)
GFR calc Af Amer: 135 mL/min/{1.73_m2} (ref >59)
GFR, EST NON AFRICAN AMERICAN: 117 mL/min/{1.73_m2} (ref >59)
GLOBULIN, TOTAL: 2.3 g/dL (ref 1.5–4.5)
GLUCOSE: 112 mg/dL — AB (ref 65–99)
Potassium: 4.4 mmol/L (ref 3.5–5.2)
Sodium: 139 mmol/L (ref 134–144)
TOTAL PROTEIN: 6.6 g/dL (ref 6.0–8.5)

## 2015-12-12 LAB — CBC
HEMATOCRIT: 37.2 % (ref 34.0–46.6)
HEMOGLOBIN: 12.2 g/dL (ref 11.1–15.9)
MCH: 28.5 pg (ref 26.6–33.0)
MCHC: 32.8 g/dL (ref 31.5–35.7)
MCV: 87 fL (ref 79–97)
Platelets: 417 10*3/uL — ABNORMAL HIGH (ref 150–379)
RBC: 4.28 x10E6/uL (ref 3.77–5.28)
RDW: 13.4 % (ref 12.3–15.4)
WBC: 6.8 10*3/uL (ref 3.4–10.8)

## 2015-12-12 LAB — LIPID PANEL
Chol/HDL Ratio: 6.2 ratio units — ABNORMAL HIGH (ref 0.0–4.4)
Cholesterol, Total: 197 mg/dL (ref 100–199)
HDL: 32 mg/dL — AB (ref >39)
LDL CALC: 129 mg/dL — AB (ref 0–99)
Triglycerides: 180 mg/dL — ABNORMAL HIGH (ref 0–149)
VLDL CHOLESTEROL CAL: 36 mg/dL (ref 5–40)

## 2015-12-12 LAB — HEMOGLOBIN A1C
Est. average glucose Bld gHb Est-mCnc: 128 mg/dL
Hgb A1c MFr Bld: 6.1 % — ABNORMAL HIGH (ref 4.8–5.6)

## 2015-12-12 LAB — TSH: TSH: 6.81 u[IU]/mL — AB (ref 0.450–4.500)

## 2015-12-12 MED ORDER — LEVOTHYROXINE SODIUM 100 MCG PO TABS: 100.0000 ug | ORAL_TABLET | Freq: Every day | ORAL | Status: DC

## 2015-12-12 NOTE — Telephone Encounter (Signed)
Called pt, notified of labs. TSH still elevated, increase synthroid to 100mcg daily from 75mcg daily- new rx sent today, recheck in 4wks. ALT slightly elevated, will recheck w/ TSH in 4wks, states drinks and uses apap occ. Lipid panel abnormal and A1C 6.1- discussed on path to be dx w/ DM. Need to lose weight by increasing exercise/activity, decreasing calories/carbs/no-little red meat- will recheck both in 3mths. Verbalized understanding of all. Switched to front to make lab appts.  Cheral MarkerKimberly R. Booker, CNM, [facility]NP[facility] 12/12/2015 12:38 PM

## 2015-12-19 ENCOUNTER — Telehealth: Payer: Self-pay | Admitting: *Deleted

## 2015-12-25 ENCOUNTER — Telehealth: Payer: Self-pay | Admitting: *Deleted

## 2015-12-25 NOTE — Telephone Encounter (Signed)
Pt informed can [facility] at [phone] and schedule appt, referral completed and faxed at last visit.  Pt verbalized understanding.

## 2016-01-09 ENCOUNTER — Other Ambulatory Visit: Payer: Medicaid Other

## 2016-01-09 DIAGNOSIS — E039 Hypothyroidism, unspecified: Secondary | ICD-10-CM

## 2016-01-09 DIAGNOSIS — R748 Abnormal levels of other serum enzymes: Secondary | ICD-10-CM

## 2016-01-10 LAB — COMPREHENSIVE METABOLIC PANEL
A/G RATIO: 1.7 (ref 1.2–2.2)
ALT: 17 IU/L (ref 0–32)
AST: 12 IU/L (ref 0–40)
Albumin: 4.2 g/dL (ref 3.5–5.5)
Alkaline Phosphatase: 50 IU/L (ref 39–117)
BUN / CREAT RATIO: 13 (ref 9–23)
BUN: 8 mg/dL (ref 6–20)
Bilirubin Total: 0.4 mg/dL (ref 0.0–1.2)
CALCIUM: 9.2 mg/dL (ref 8.7–10.2)
CO2: 19 mmol/L (ref 18–29)
CREATININE: 0.62 mg/dL (ref 0.57–1.00)
Chloride: 102 mmol/L (ref 96–106)
GFR calc Af Amer: 138 mL/min/{1.73_m2} (ref >59)
GFR, EST NON AFRICAN AMERICAN: 120 mL/min/{1.73_m2} (ref >59)
GLOBULIN, TOTAL: 2.5 g/dL (ref 1.5–4.5)
Glucose: 108 mg/dL — ABNORMAL HIGH (ref 65–99)
Potassium: 4.7 mmol/L (ref 3.5–5.2)
SODIUM: 139 mmol/L (ref 134–144)
TOTAL PROTEIN: 6.7 g/dL (ref 6.0–8.5)

## 2016-01-10 LAB — TSH: TSH: 4.29 u[IU]/mL (ref 0.450–4.500)

## 2016-01-14 ENCOUNTER — Telehealth: Payer: Self-pay | Admitting: Women's Health

## 2016-01-14 NOTE — Telephone Encounter (Signed)
Notified pt of normal labs. Will repeat at scheduled visit in Aug.  Cheral MarkerKimberly R. Booker, CNM, [facility] 01/14/2016 4:31 PM

## 2016-03-12 ENCOUNTER — Encounter: Payer: Self-pay | Admitting: Women's Health

## 2016-03-12 ENCOUNTER — Ambulatory Visit (INDEPENDENT_AMBULATORY_CARE_PROVIDER_SITE_OTHER): Payer: Medicaid Other | Admitting: Women's Health

## 2016-03-12 VITALS — BP 128/72 | HR 68 | Ht 65.0 in | Wt 291.0 lb

## 2016-03-12 DIAGNOSIS — E669 Obesity, unspecified: Secondary | ICD-10-CM | POA: Diagnosis not present

## 2016-03-12 DIAGNOSIS — Z6841 Body Mass Index (BMI) 40.0 and over, adult: Secondary | ICD-10-CM | POA: Diagnosis not present

## 2016-03-12 DIAGNOSIS — H02849 Edema of unspecified eye, unspecified eyelid: Secondary | ICD-10-CM

## 2016-03-12 DIAGNOSIS — R7309 Other abnormal glucose: Secondary | ICD-10-CM | POA: Diagnosis not present

## 2016-03-12 DIAGNOSIS — E7800 Pure hypercholesterolemia, unspecified: Secondary | ICD-10-CM

## 2016-03-12 DIAGNOSIS — E0590 Thyrotoxicosis, unspecified without thyrotoxic crisis or storm: Secondary | ICD-10-CM

## 2016-03-12 DIAGNOSIS — Z1389 Encounter for screening for other disorder: Secondary | ICD-10-CM

## 2016-03-12 DIAGNOSIS — Z3041 Encounter for surveillance of contraceptive pills: Secondary | ICD-10-CM | POA: Diagnosis not present

## 2016-03-12 LAB — POCT URINALYSIS DIPSTICK
GLUCOSE UA: NEGATIVE
Ketones, UA: NEGATIVE
Leukocytes, UA: NEGATIVE
NITRITE UA: NEGATIVE
Protein, UA: NEGATIVE

## 2016-03-12 NOTE — Progress Notes (Signed)
   [facility]  Patient name: April Carney Marzo MRN [id number]  Date of birth: [birth year]  CC & HPI:  April Carney Coggin is a 35 y.o. [id number] Caucasian female presenting today for f/u on COCs. Rx'd LoLoestrin 12/11/15, doing well, periods good, occ misses pills. Taking synthroid 100mcg daily for hypothyroidism as directed, rechecking TSH today. Had abnormal lipid panel and elevated A1C of 6.1 in May, was advised to work on [employer]weight loss/increase activity/decrease carbs/calories/red meats- has lost 4lb since last visit, will recheck lipid panel and A1C today.  Was feeling depressed/angry all the time at her last visit, we discussed options and she opted for [facility]/ [facility], she has been seeing them q 2wks for counseling and feels like things are heading in the right direction- she sees the medication doctor on 8/31 to discuss if meds are needed. Denies SI/HI/II.  She also reports her eyelids have been swelling, it happened the other day after she cleaned her house, then again when she was standing outside. States they get really puffy then they start to peel.  Patient's last menstrual period was 03/08/2016. The current method of family planning is OCP (estrogen/progesterone). Last pap Sept 2016, neg w/ -HRHPV  Pertinent History Reviewed:  Medical & Surgical Hx:   Past medical, surgical, family, and social history reviewed in electronic medical record Medications: Reviewed & Updated - see associated section Allergies: Reviewed in electronic medical record  Objective Findings:  Vitals: BP 128/72 (BP Location: Right Arm, Patient Position: Sitting, Cuff Size: Large)   Pulse 68   Ht 5\' 5"  (1.651 m)   Wt 291 lb (132 kg)   LMP 03/08/2016   BMI 48.42 kg/m  Body mass index is 48.42 kg/m.  Physical Examination: General appearance - alert, well appearing, and in no distress Eyelids: no edema apparent today, Lt eyelid does appear to be peeling some  Results for orders placed  or performed in visit on 03/12/16 (from the past 24 hour(s))  POCT urinalysis dipstick   Collection Time: 03/12/16  9:30 AM  Result Value Ref Range   Color, UA     Clarity, UA     Glucose, UA neg    Bilirubin, UA     Ketones, UA neg    Spec Grav, UA     Blood, UA 3+    pH, UA     Protein, UA neg    Urobilinogen, UA     Nitrite, UA neg    Leukocytes, UA Negative Negative   Hematuria noted, pt is on period  Assessment & Plan:  A:   Contraception management  Hypothyroidism  Pre-diabetes  Abnormal lipid panel  Obese  Depression/anger  Edematous/peeling eyelids  P:  Continue LoLoestrin daily, set alarm to help remember to take  Continue synthroid 100mcg daily  Recheck TSH, Lipid panel, A1C today  Praised on 4lb weight loss, continue to work on diet/exercise/wt loss  [facility], keep appt w/ med md on 8/31 as scheduled  No protein in urine to suggest nephrotic syndrome w/ edematous eyelids, likely allergies from cleaning/being outside, can try zyrtec or claritin  Return in about 3 months (around 06/12/2016) for physical.  Marge DuncansBooker, Myrtice Lowdermilk Randall CNM, WHNP-BC 03/12/2016 9:31 AM

## 2016-03-13 LAB — LIPID PANEL
CHOLESTEROL TOTAL: 210 mg/dL — AB (ref 100–199)
Chol/HDL Ratio: 6.2 ratio units — ABNORMAL HIGH (ref 0.0–4.4)
HDL: 34 mg/dL — ABNORMAL LOW (ref >39)
LDL CALC: 142 mg/dL — AB (ref 0–99)
TRIGLYCERIDES: 170 mg/dL — AB (ref 0–149)
VLDL Cholesterol Cal: 34 mg/dL (ref 5–40)

## 2016-03-13 LAB — HEMOGLOBIN A1C
ESTIMATED AVERAGE GLUCOSE: 117 mg/dL
Hgb A1c MFr Bld: 5.7 % — ABNORMAL HIGH (ref 4.8–5.6)

## 2016-03-13 LAB — TSH: TSH: 6.23 u[IU]/mL — AB (ref 0.450–4.500)

## 2016-03-19 ENCOUNTER — Telehealth: Payer: Self-pay | Admitting: Women's Health

## 2016-03-19 DIAGNOSIS — R7303 Prediabetes: Secondary | ICD-10-CM

## 2016-03-19 DIAGNOSIS — E785 Hyperlipidemia, unspecified: Secondary | ICD-10-CM

## 2016-03-19 DIAGNOSIS — E039 Hypothyroidism, unspecified: Secondary | ICD-10-CM

## 2016-03-19 DIAGNOSIS — E7889 Other lipoprotein metabolism disorders: Secondary | ICD-10-CM

## 2016-03-19 MED ORDER — LEVOTHYROXINE SODIUM 125 MCG PO TABS: 125.0000 ug | ORAL_TABLET | Freq: Every day | ORAL | 6 refills | Status: DC

## 2016-03-19 NOTE — Telephone Encounter (Signed)
LM for pt to return call, tsh elevated again-sent rx to increase synthroid 125mcg daily. Cholesterol levels worse, add Red Krill Oil 1,500mg  daily, decrease red meats/carbs/increase exercise/wt loss. Need to recheck TSH in 6wks and fasting lipid panel in 3mths.  Cheral MarkerKimberly R. Mercy Leppla, CNM, WHNP-BC 03/19/2016 2:26 PM

## 2016-03-19 NOTE — Telephone Encounter (Signed)
Returned pt's call, notified her of elevated tsh- new rx sent for synthroid 125mcg daily (increased from 100mcg daily)-will recheck tsh in 6wks. Elevated cholesterol levels- really needs to work hard on decreasing red meats/carbs/increasing exercise wt loss and add Red Krill Oil 1,500mg  daily- will recheck fasting lipid panel in 3mths.  Cheral MarkerKimberly R. Lorianna Carney, CNM, [facility]NP[facility]BC 03/19/2016 3:09 PM

## 2016-06-16 ENCOUNTER — Other Ambulatory Visit: Payer: Medicaid Other | Admitting: Women's Health

## 2016-06-17 ENCOUNTER — Other Ambulatory Visit: Payer: Medicaid Other | Admitting: Adult Health

## 2016-06-23 ENCOUNTER — Other Ambulatory Visit: Payer: Self-pay | Admitting: Women's Health

## 2016-06-23 ENCOUNTER — Telehealth: Payer: Self-pay | Admitting: *Deleted

## 2016-06-23 DIAGNOSIS — E785 Hyperlipidemia, unspecified: Secondary | ICD-10-CM

## 2016-06-23 NOTE — Telephone Encounter (Signed)
[facility] needs order for lipid panel

## 2016-06-24 LAB — LIPID PANEL
CHOL/HDL RATIO: 5.9 ratio — AB (ref 0.0–4.4)
Cholesterol, Total: 224 mg/dL — ABNORMAL HIGH (ref 100–199)
HDL: 38 mg/dL — AB (ref >39)
LDL CALC: 148 mg/dL — AB (ref 0–99)
Triglycerides: 190 mg/dL — ABNORMAL HIGH (ref 0–149)
VLDL Cholesterol Cal: 38 mg/dL (ref 5–40)

## 2016-06-24 LAB — TSH: TSH: 13.26 u[IU]/mL — ABNORMAL HIGH (ref 0.450–4.500)

## 2016-06-24 LAB — SPECIMEN STATUS REPORT

## 2016-06-26 ENCOUNTER — Telehealth: Payer: Self-pay | Admitting: Women's Health

## 2016-06-26 NOTE — Telephone Encounter (Signed)
Left message for pt to return call. Need to discuss labs.  Cheral MarkerKimberly R. Barry Faircloth, CNM, [facility]NP-BC 06/26/2016 3:47 PM

## 2016-07-01 ENCOUNTER — Encounter: Payer: Self-pay | Admitting: Adult Health

## 2016-07-01 ENCOUNTER — Ambulatory Visit (INDEPENDENT_AMBULATORY_CARE_PROVIDER_SITE_OTHER): Payer: Medicaid Other | Admitting: Adult Health

## 2016-07-01 VITALS — BP 150/100 | HR 81 | Ht 64.75 in | Wt 290.6 lb

## 2016-07-01 DIAGNOSIS — Z01419 Encounter for gynecological examination (general) (routine) without abnormal findings: Secondary | ICD-10-CM | POA: Insufficient documentation

## 2016-07-01 DIAGNOSIS — I10 Essential (primary) hypertension: Secondary | ICD-10-CM

## 2016-07-01 DIAGNOSIS — B369 Superficial mycosis, unspecified: Secondary | ICD-10-CM | POA: Insufficient documentation

## 2016-07-01 DIAGNOSIS — Z0000 Encounter for general adult medical examination without abnormal findings: Secondary | ICD-10-CM | POA: Diagnosis not present

## 2016-07-01 DIAGNOSIS — Z3041 Encounter for surveillance of contraceptive pills: Secondary | ICD-10-CM

## 2016-07-01 DIAGNOSIS — F329 Major depressive disorder, single episode, unspecified: Secondary | ICD-10-CM

## 2016-07-01 DIAGNOSIS — F32A Depression, unspecified: Secondary | ICD-10-CM

## 2016-07-01 MED ORDER — HYDROCHLOROTHIAZIDE 12.5 MG PO CAPS: 12.5000 mg | ORAL_CAPSULE | Freq: Every day | ORAL | 12 refills | Status: DC

## 2016-07-01 MED ORDER — NORETHIN-ETH ESTRAD-FE BIPHAS 1 MG-10 MCG / 10 MCG PO TABS
1.0000 | ORAL_TABLET | Freq: Every day | ORAL | 3 refills | Status: DC
Start: 2016-07-01 — End: 2017-03-26

## 2016-07-01 MED ORDER — NYSTATIN 100000 UNIT/GM EX POWD: Freq: Two times a day (BID) | CUTANEOUS | 3 refills | Status: DC | PRN

## 2016-07-01 NOTE — Progress Notes (Signed)
[name], female   DOB: 03/01/1981, 35 y.o.   MRN: [id number] History of Present Illness: April Carney is a 35 year old white female in for well woman gyn exam, she had normal pap with negative HPV 04/24/15.She has been off thyroid meds and had labs, and is going to restart meds per Joellyn HaffKim Booker, CNM.   Current Medications, Allergies, Past Medical History, Past Surgical History, Family History and Social History were [facility].     Review of Systems: Patient denies any headaches, hearing loss, fatigue, blurred vision, shortness of breath, chest pain, abdominal pain, problems with bowel movements, urination, or intercourse. No joint pain or mood swings.    Physical Exam:BP (!) 150/100 (BP Location: Left Arm, Patient Position: Sitting, Cuff Size: Large)   Pulse 81   Ht 5' 4.75" (1.645 m)   Wt 290 lb 9.6 oz (131.8 kg)   LMP 06/28/2016   BMI 48.73 kg/m General:  Well developed, well nourished, no acute distress Skin:  Warm and dry Neck:  Midline trachea, normal thyroid, good ROM, no lymphadenopathy Lungs; Clear to auscultation bilaterally Breast:  No dominant palpable mass, retraction, or nipple discharge, has skin fungus under both breasts. Cardiovascular: Regular rate and rhythm Abdomen:  Soft, non tender, no hepatosplenomegaly Pelvic:  External genitalia is normal in appearance, no lesions.  The vagina is normal in appearance,period like blood. Urethra has no lesions or masses. The cervix is bulbous.  Uterus is felt to be normal size, shape, and contour.  No adnexal masses or tenderness noted.Bladder is non tender, no masses felt. Extremities/musculoskeletal:  No swelling or varicosities noted, no clubbing or cyanosis Psych:  No mood changes, alert and cooperative,seems happy PHQ 9 score 8, is on Prozac and [facility], discussed will give BP meds  And decrease salt and sugars, and recheck BP  in 4 weeks.   Impression: 1. Well woman exam with routine gynecological exam   2. Superficial fungus infection of skin   3. Encounter for surveillance of contraceptive pills   4. Essential hypertension   5. Depression, unspecified depression type       Plan:  Refilled nystatin powder use bid prn, 15 gm with 3 refills Refilled lo loestrin disp 3 packs take 1 daily with 3 refills Rx microzide 12.5 mg #30 take 1 daily with 12 refills Review handout on hypertension, decrease salt and sugar  Get back on synthroid Follow up in 4 weeks for BP check Physical in 1 year, pap in 2019

## 2016-07-01 NOTE — Patient Instructions (Addendum)
Hypertension Hypertension, commonly called high blood pressure, is when the force of blood pumping through your arteries is too strong. Your arteries are the blood vessels that carry blood from your heart throughout your body. A blood pressure reading consists of a higher number over a lower number, such as 110/72. The higher number (systolic) is the pressure inside your arteries when your heart pumps. The lower number (diastolic) is the pressure inside your arteries when your heart relaxes. Ideally you want your blood pressure below 120/80. Hypertension forces your heart to work harder to pump blood. Your arteries may become narrow or stiff. Having untreated or uncontrolled hypertension can cause heart attack, stroke, kidney disease, and other problems. What increases the risk? Some risk factors for high blood pressure are controllable. Others are not. Risk factors you cannot control include:  Race. You may be at higher risk if you are African American.  Age. Risk increases with age.  Gender. Men are at higher risk than women before age 45 years. After age 65, women are at higher risk than men. Risk factors you can control include:  Not getting enough exercise or physical activity.  Being overweight.  Getting too much fat, sugar, calories, or salt in your diet.  Drinking too much alcohol. What are the signs or symptoms? Hypertension does not usually cause signs or symptoms. Extremely high blood pressure (hypertensive crisis) may cause headache, anxiety, shortness of breath, and nosebleed. How is this diagnosed? To check if you have hypertension, your health care provider will measure your blood pressure while you are seated, with your arm held at the level of your heart. It should be measured at least twice using the same arm. Certain conditions can cause a difference in blood pressure between your right and left arms. A blood pressure reading that is higher than normal on one occasion does  not mean that you need treatment. If it is not clear whether you have high blood pressure, you may be asked to return on a different day to have your blood pressure checked again. Or, you may be asked to monitor your blood pressure at home for 1 or more weeks. How is this treated? Treating high blood pressure includes making lifestyle changes and possibly taking medicine. Living a healthy lifestyle can help lower high blood pressure. You may need to change some of your habits. Lifestyle changes may include:  Following the DASH diet. This diet is high in fruits, vegetables, and whole grains. It is low in salt, red meat, and added sugars.  Keep your sodium intake below 2,300 mg per day.  Getting at least 30-45 minutes of aerobic exercise at least 4 times per week.  Losing weight if necessary.  Not smoking.  Limiting alcoholic beverages.  Learning ways to reduce stress. Your health care provider may prescribe medicine if lifestyle changes are not enough to get your blood pressure under control, and if one of the following is true:  You are 18-59 years of age and your systolic blood pressure is above 140.  You are 60 years of age or older, and your systolic blood pressure is above 150.  Your diastolic blood pressure is above 90.  You have diabetes, and your systolic blood pressure is over 140 or your diastolic blood pressure is over 90.  You have kidney disease and your blood pressure is above 140/90.  You have heart disease and your blood pressure is above 140/90. Your personal target blood pressure may vary depending on your medical   conditions, your age, and other factors. Follow these instructions at home:  Have your blood pressure rechecked as directed by your health care provider.  Take medicines only as directed by your health care provider. Follow the directions carefully. Blood pressure medicines must be taken as prescribed. The medicine does not work as well when you skip  doses. Skipping doses also puts you at risk for problems.  Do not smoke.  Monitor your blood pressure at home as directed by your health care provider. Contact a health care provider if:  You think you are having a reaction to medicines taken.  You have recurrent headaches or feel dizzy.  You have swelling in your ankles.  You have trouble with your vision. Get help right away if:  You develop a severe headache or confusion.  You have unusual weakness, numbness, or feel faint.  You have severe chest or abdominal pain.  You vomit repeatedly.  You have trouble breathing. This information is not intended to replace advice given to you by your health care provider. Make sure you discuss any questions you have with your health care provider. Document Released: 07/14/2005 Document Revised: 12/20/2015 Document Reviewed: 05/06/2013 Elsevier Interactive Patient Education  2017 Elsevier Inc. Decrease salt and sugars  Follow up in 4 weeks for BP check Physical in 1 year

## 2016-07-23 ENCOUNTER — Telehealth: Payer: Self-pay | Admitting: Women's Health

## 2016-07-23 NOTE — Telephone Encounter (Signed)
Finally able to contact pt after multiple attempts. Discussed labs drawn 06/23/16. TSH up to 13.260 from 6.230 4mths earlier. She is supposed to be on synthroid 125mcg daily- states she had stopped taking few days prior to labs d/t running out. Is taking daily as directed now for past 3wks. Lipid panel still abnormal, levels slightly worse from check 4mths prior. Was supposed to be taking Red [place] Oil 1,500mg  daily- states she never started b/c she had a lot going on w/ her children. Advised to start asap, decrease red meats/carbs, lose weight-plan to recheck in 3mths as long as being compliant w/ meds. Has appt for bp check w/ Victorino DikeJennifer on 07/31/15, discussed all w/ her as well.  Cheral MarkerKimberly R. Isaiah Torok, CNM, [facility] 07/23/2016 3:21 PM

## 2016-07-30 ENCOUNTER — Ambulatory Visit (INDEPENDENT_AMBULATORY_CARE_PROVIDER_SITE_OTHER): Payer: Medicaid Other | Admitting: Adult Health

## 2016-07-30 ENCOUNTER — Encounter: Payer: Self-pay | Admitting: Adult Health

## 2016-07-30 VITALS — BP 141/70 | HR 67 | Ht 64.75 in | Wt 294.0 lb

## 2016-07-30 DIAGNOSIS — I10 Essential (primary) hypertension: Secondary | ICD-10-CM

## 2016-07-30 NOTE — Patient Instructions (Signed)
Take meds  Follow up in 3 months for labs

## 2016-07-30 NOTE — Progress Notes (Signed)
Subjective:     [name], female   DOB: [birth year], 36 y.o.   MRN: [id number]  HPI April Carney is a 36 year old white female in for BP check after starting Microzide in December.She has no complaints today.   Review of Systems Patient denies any headaches, hearing loss, fatigue, blurred vision, shortness of breath, chest pain, abdominal pain, problems with bowel movements, urination, or intercourse. No joint pain or mood swings. Reviewed past medical,surgical, social and family history. Reviewed medications and allergies.     Objective:   Physical Exam BP (!) 141/70 (BP Location: Left Arm, Patient Position: Sitting, Cuff Size: Large)   Pulse 67   Ht 5' 4.75" (1.645 m)   Wt 294 lb (133.4 kg)   LMP 06/28/2016   BMI 49.30 kg/m   PHQ 2 score 0.Skin warm and dry. Lungs: clear to ausculation bilaterally. Cardiovascular: regular rate and rhythm.   Instructed to take meds every day and cut carbs to try to lose some weight and she agrees.  Assessment:     1. Essential hypertension       Plan:     Continue Microzide Take red krell oil and synthroid daily and cut carbs and return in 3 months for labs(TSH,lipids and CMP)

## 2016-10-28 ENCOUNTER — Other Ambulatory Visit: Payer: Medicaid Other

## 2016-10-29 LAB — TSH: TSH: 8.79 u[IU]/mL — AB (ref 0.450–4.500)

## 2016-10-29 LAB — LIPID PANEL
CHOLESTEROL TOTAL: 227 mg/dL — AB (ref 100–199)
Chol/HDL Ratio: 6.3 ratio — ABNORMAL HIGH (ref 0.0–4.4)
HDL: 36 mg/dL — ABNORMAL LOW (ref >39)
LDL Calculated: 149 mg/dL — ABNORMAL HIGH (ref 0–99)
Triglycerides: 212 mg/dL — ABNORMAL HIGH (ref 0–149)
VLDL Cholesterol Cal: 42 mg/dL — ABNORMAL HIGH (ref 5–40)

## 2016-10-30 ENCOUNTER — Telehealth: Payer: Self-pay | Admitting: Women's Health

## 2016-10-30 DIAGNOSIS — E039 Hypothyroidism, unspecified: Secondary | ICD-10-CM

## 2016-11-03 ENCOUNTER — Encounter: Payer: Self-pay | Admitting: Women's Health

## 2016-11-06 MED ORDER — ATORVASTATIN CALCIUM 10 MG PO TABS: 10.0000 mg | ORAL_TABLET | Freq: Every day | ORAL | 3 refills | Status: DC

## 2016-11-06 MED ORDER — LEVOTHYROXINE SODIUM 137 MCG PO TABS: 137.0000 ug | ORAL_TABLET | Freq: Every day | ORAL | 3 refills | Status: DC

## 2016-11-06 NOTE — Telephone Encounter (Signed)
Called pt, notified of labs. Lipid panel worsening despite recommendations of red krill oil/diet changes/wt loss/exercise- rx atorvastatin  daily RF x 3. TSH better but still high, reports she has been taking synthroid daily as rx'd- will increase to daily RF x 3 and plan to recheck in 8wks (if not established w/ PCP yet). Advised she needs to get PCP asap (list given for local practices accepting new pts) to manage all of her growing medical conditions (recent dx htn requiring meds, recent dx hyperlipidemia requiring meds, hypothyroidism, pre-diabetes) as I no longer feel comfortable managing. Pt verbalized understanding.  [name], CNM, [facility] 11/06/2016 10:03 AM

## 2016-11-11 DIAGNOSIS — E7800 Pure hypercholesterolemia, unspecified: Secondary | ICD-10-CM | POA: Diagnosis not present

## 2016-11-11 DIAGNOSIS — F419 Anxiety disorder, unspecified: Secondary | ICD-10-CM | POA: Diagnosis not present

## 2016-11-11 DIAGNOSIS — I10 Essential (primary) hypertension: Secondary | ICD-10-CM | POA: Diagnosis not present

## 2016-11-11 DIAGNOSIS — F4310 Post-traumatic stress disorder, unspecified: Secondary | ICD-10-CM | POA: Diagnosis not present

## 2016-11-11 DIAGNOSIS — E039 Hypothyroidism, unspecified: Secondary | ICD-10-CM | POA: Diagnosis not present

## 2017-03-26 ENCOUNTER — Encounter: Payer: Self-pay | Admitting: Family Medicine

## 2017-03-26 ENCOUNTER — Ambulatory Visit (INDEPENDENT_AMBULATORY_CARE_PROVIDER_SITE_OTHER): Payer: Medicaid Other | Admitting: Family Medicine

## 2017-03-26 VITALS — BP 118/68 | HR 83 | Temp 97.4°F | Resp 18 | Ht 66.0 in | Wt 300.8 lb

## 2017-03-26 DIAGNOSIS — I10 Essential (primary) hypertension: Secondary | ICD-10-CM

## 2017-03-26 DIAGNOSIS — E039 Hypothyroidism, unspecified: Secondary | ICD-10-CM | POA: Diagnosis not present

## 2017-03-26 DIAGNOSIS — Z23 Encounter for immunization: Secondary | ICD-10-CM

## 2017-03-26 DIAGNOSIS — E782 Mixed hyperlipidemia: Secondary | ICD-10-CM

## 2017-03-26 LAB — BASIC METABOLIC PANEL
BUN: 9 mg/dL (ref 7–25)
CHLORIDE: 103 mmol/L (ref 98–110)
CO2: 26 mmol/L (ref 20–32)
CREATININE: 0.59 mg/dL (ref 0.50–1.10)
Calcium: 9.1 mg/dL (ref 8.6–10.2)
Glucose, Bld: 95 mg/dL (ref 65–99)
POTASSIUM: 4.2 mmol/L (ref 3.5–5.3)
Sodium: 136 mmol/L (ref 135–146)

## 2017-03-26 LAB — HEPATIC FUNCTION PANEL
ALT: 38 U/L — AB (ref 6–29)
AST: 22 U/L (ref 10–30)
Albumin: 4.3 g/dL (ref 3.6–5.1)
Alkaline Phosphatase: 48 U/L (ref 33–115)
BILIRUBIN DIRECT: 0.1 mg/dL (ref <=0.2)
BILIRUBIN INDIRECT: 0.5 mg/dL (ref 0.2–1.2)
TOTAL PROTEIN: 6.7 g/dL (ref 6.1–8.1)
Total Bilirubin: 0.6 mg/dL (ref 0.2–1.2)

## 2017-03-26 MED ORDER — HYDROCHLOROTHIAZIDE 12.5 MG PO CAPS: 12.5000 mg | ORAL_CAPSULE | Freq: Every day | ORAL | 12 refills | Status: DC

## 2017-03-26 MED ORDER — LEVOTHYROXINE SODIUM 137 MCG PO TABS: 137.0000 ug | ORAL_TABLET | Freq: Every day | ORAL | 3 refills | Status: DC

## 2017-03-26 NOTE — Patient Instructions (Signed)
Levothyroxine tablets What is this medicine? LEVOTHYROXINE (lee voe thye ROX een) is a thyroid hormone. This medicine can improve symptoms of thyroid deficiency such as slow speech, lack of energy, weight gain, hair loss, dry skin, and feeling cold. It also helps to treat goiter (an enlarged thyroid gland). It is also used to treat some kinds of thyroid cancer along with surgery and other medicines. This medicine may be used for other purposes; ask your health care provider or pharmacist if you have questions. COMMON BRAND NAME(S): Estre, Levo-T, Levothroid, Levoxyl, Synthroid, Thyro-Tabs, Unithroid What should I tell my health care provider before I take this medicine? They need to know if you have any of these conditions: -angina -blood clotting problems -diabetes -dieting or on a weight loss program -fertility problems -heart disease -high levels of thyroid hormone -pituitary gland problem -previous heart attack -an unusual or allergic reaction to levothyroxine, thyroid hormones, other medicines, foods, dyes, or preservatives -pregnant or trying to get pregnant -breast-feeding How should I use this medicine? Take this medicine by mouth with plenty of water. It is best to take on an empty stomach, at least 30 minutes before or 2 hours after food. Follow the directions on the prescription label. Take at the same time each day. Do not take your medicine more often than directed. Contact your pediatrician regarding the use of this medicine in children. While this drug may be prescribed for children and infants as young as a few days of age for selected conditions, precautions do apply. For infants, you may crush the tablet and place in a small amount of (5-10 ml or 1 to 2 teaspoonfuls) of water, breast milk, or non-soy based infant formula. Do not mix with soy-based infant formula. Give as directed. Overdosage: If you think you have taken too much of this medicine contact a poison control center  or emergency room at once. NOTE: This medicine is only for you. Do not share this medicine with others. What if I miss a dose? If you miss a dose, take it as soon as you can. If it is almost time for your next dose, take only that dose. Do not take double or extra doses. What may interact with this medicine? -amiodarone -antacids -anti-thyroid medicines -calcium supplements -carbamazepine -cholestyramine -colestipol -digoxin -female hormones, including contraceptive or birth control pills -iron supplements -ketamine -liquid nutrition products like Ensure -medicines for colds and breathing difficulties -medicines for diabetes -medicines for mental depression -medicines or herbals used to decrease weight or appetite -phenobarbital or other barbiturate medications -phenytoin -prednisone or other corticosteroids -rifabutin -rifampin -soy isoflavones -sucralfate -theophylline -warfarin This list may not describe all possible interactions. Give your health care provider a list of all the medicines, herbs, non-prescription drugs, or dietary supplements you use. Also tell them if you smoke, drink alcohol, or use illegal drugs. Some items may interact with your medicine. What should I watch for while using this medicine? Be sure to take this medicine with plenty of fluids. Some tablets may cause choking, gagging, or difficulty swallowing from the tablet getting stuck in your throat. Most of these problems disappear if the medicine is taken with the right amount of water or other fluids. Do not switch brands of this medicine unless your health care professional agrees with the change. Ask questions if you are uncertain. You will need regular exams and occasional blood tests to check the response to treatment. If you are receiving this medicine for an underactive thyroid, it may be several weeks   before you notice an improvement. Check with your doctor or health care professional if your  symptoms do not improve. It may be necessary for you to take this medicine for the rest of your life. Do not stop using this medicine unless your doctor or health care professional advises you to. This medicine can affect blood sugar levels. If you have diabetes, check your blood sugar as directed. You may lose some of your hair when you first start treatment. With time, this usually corrects itself. If you are going to have surgery, tell your doctor or health care professional that you are taking this medicine. What side effects may I notice from receiving this medicine? Side effects that you should report to your doctor or health care professional as soon as possible: -allergic reactions like skin rash, itching or hives, swelling of the face, lips, or tongue -chest pain -excessive sweating or intolerance to heat -fast or irregular heartbeat -nervousness -skin rash or hives -swelling of ankles, feet, or legs -tremors Side effects that usually do not require medical attention (report to your doctor or health care professional if they continue or are bothersome): -changes in appetite -changes in menstrual periods -diarrhea -hair loss -headache -trouble sleeping -weight loss This list may not describe all possible side effects. Call your doctor for medical advice about side effects. You may report side effects to FDA at 1-800-FDA-1088. Where should I keep my medicine? Keep out of the reach of children. Store at room temperature between 15 and 30 degrees C (59 and 86 degrees F). Protect from light and moisture. Keep container tightly closed. Throw away any unused medicine after the expiration date. NOTE: This sheet is a summary. It may not cover all possible information. If you have questions about this medicine, talk to your doctor, pharmacist, or health care provider.  2018 Elsevier/Gold Standard (2008-10-20 14:28:07)  

## 2017-03-26 NOTE — Progress Notes (Signed)
Subjective:    [name], female    DOB: [birth year], 36 y.o.   MRN: [id number]  Here to establish care. Here to follow up. Takes of son who has biliary atresia. Reports that does not always take her medications. Reports that was told cholesterol is elevated. Has been working on diet and trying to feel better. Reports that has gotten son's health better, so now is ready to focus on her health. Has cut down on soda. Has not diagnosed with diabetes. Sees psychiatry for mood. Reports that mood is good. Is worried about diabetes risk b/c of parents history.    Hyperlipidemia  This is a chronic problem. The current episode started more than 1 year ago. The problem is uncontrolled. Recent lipid tests were reviewed and are variable. Exacerbating diseases include hypothyroidism and obesity. She has no history of diabetes. Factors aggravating her hyperlipidemia include fatty foods. Pertinent negatives include no chest pain, focal sensory loss, focal weakness, leg pain, myalgias or shortness of breath. She is currently on no antihyperlipidemic treatment (has been on lipitor but is not currently on any medicaitons. ). Improvement on treatment: unsure. Compliance problems include adherence to diet, adherence to exercise and psychosocial issues.  Risk factors for coronary artery disease include dyslipidemia, family history, obesity, hypertension, a sedentary lifestyle and stress.  Thyroid Problem  Presents for initial visit. Patient reports no anxiety, constipation, depressed mood, diaphoresis, diarrhea, dry skin, fatigue, hoarse voice, leg swelling, menstrual problem, nail problem, palpitations, tremors, visual change, weight gain or weight loss. The symptoms have been stable (However, does not always take medication). Past treatments include levothyroxine. The treatment provided mild relief. The following procedures have not been performed: radioiodine uptake scan, thyroid FNA, thyroid  ultrasound and thyroidectomy. Her past medical history is significant for hyperlipidemia and obesity. There is no history of diabetes. Risk factors include family history of hypothyroidism.  Hypertension  This is a chronic problem. The current episode started more than 1 year ago. The problem has been resolved since onset. The problem is controlled. Pertinent negatives include no anxiety, blurred vision, chest pain, headaches, palpitations, peripheral edema or shortness of breath. There are no associated agents to hypertension. Risk factors for coronary artery disease include dyslipidemia, family history, obesity, sedentary lifestyle and stress. Past treatments include lifestyle changes and diuretics. The current treatment provides mild improvement. There are no compliance problems.  There is no history of angina, kidney disease, CAD/MI, CVA or left ventricular hypertrophy. Identifiable causes of hypertension include a thyroid problem.    Past Medical History:  Diagnosis Date  . Hyperlipidemia   . Hypertension   . Hypoactive thyroid   . Mental disorder    depression and PTSD   Past Surgical History:  Procedure Laterality Date  . CESAREAN SECTION     Immunization History  Administered Date(s) Administered  . Influenza,inj,Quad PF,6+ Mos 03/26/2017   Social History  Substance Use Topics  . Smoking status: Never Smoker  . Smokeless tobacco: Never Used  . Alcohol use Yes     Comment: occasionally     Review of Systems  Constitutional: Negative for diaphoresis, fatigue, weight gain and weight loss.  HENT: Negative for hoarse voice and trouble swallowing.   Eyes: Negative for blurred vision.  Respiratory: Negative for cough and shortness of breath.   Cardiovascular: Negative for chest pain and palpitations.  Gastrointestinal: Negative for constipation and diarrhea.  Genitourinary: Negative for menstrual problem.  Musculoskeletal: Negative for myalgias.  Skin: Negative for rash.  Neurological: Negative for dizziness, tremors, focal weakness and headaches.  Psychiatric/Behavioral: Negative for agitation, behavioral problems, dysphoric mood, sleep disturbance and suicidal ideas. The patient is not nervous/anxious.        Objective:   Physical Exam  Vitals:   03/26/17 0855  BP: 118/68  Pulse: 83  Resp: 18  Temp: (!) 97.4 F (36.3 C)  SpO2: 97%    Constitutional: Patient is oriented to person, place, and time. Appears well-developed and well-nourished. No distress.  HENT:  Head: Normocephalic and atraumatic.  Eyes: Pupils are equal, round, and reactive to light. Conjunctivae are normal.  Neck: Normal range of motion. Neck supple. No thyromegaly present.  Cardiovascular: Normal rate, regular rhythm and normal heart sounds.   No murmur heard. Pulmonary/Chest: Effort normal and breath sounds normal. No respiratory distress. No wheezes.  Neurological: Alert and oriented to person, place, and time. No gross cranial nerve deficit.  Skin: Skin is warm and dry.  Psychiatric: Normal mood and affect. Behavior is normal.      Assessment & Plan:  1. Need for immunization against influenza  - Flu Vaccine QUAD 36+ mos IM  2. Hypothyroidism, unspecified type Patient has not been compliant with her medications. Reports takes only about 4 days a week. Patient agreed to be compliant and will recheck in 2 months and adjust levothyroxine as directed.  - levothyroxine (SYNTHROID, LEVOTHROID) 137 MCG tablet; Take 1 tablet (137 mcg total) by mouth daily before breakfast.  Dispense: 30 tablet; Refill: 3  3. Essential hypertension  - hydrochlorothiazide (MICROZIDE) 12.5 MG capsule; Take 1 capsule (12.5 mg total) by mouth daily.  Dispense: 30 capsule; Refill: 12 - Basic Metabolic Panel (BMET) - Hemoglobin A1C-check due to high risk due to HTN, HLD, obesity.   4. Mixed hyperlipidemia Since patient is not taking her lipitor at this time, we will check lipids and I will let  her know if she needs to continue the medication. If so, we will call in the medication to the pharmacy.  Total lifestyle chagnes for diet and exercise recommended. Regarding lipitor: Patient counseled in detail regarding the risks of medication. Told to call or return to clinic if develop any worrisome signs or symptoms. Patient voiced understanding.   - Hepatic function panel - Hemoglobin A1C  5.  Depression: follow up with psychiatry as directed. Voiced understanding.  Patient will follow up for CPE. Keep appointment for pap/pelvic/CBE with GYN.

## 2017-03-27 ENCOUNTER — Encounter: Payer: Self-pay | Admitting: Family Medicine

## 2017-03-27 LAB — HEMOGLOBIN A1C
Hgb A1c MFr Bld: 5.2 % (ref <5.7)
MEAN PLASMA GLUCOSE: 103 mg/dL

## 2017-04-27 ENCOUNTER — Encounter: Payer: Self-pay | Admitting: Family Medicine

## 2017-04-28 ENCOUNTER — Telehealth: Payer: Self-pay

## 2017-04-28 NOTE — Telephone Encounter (Signed)
msg forwarded to pcp

## 2017-04-28 NOTE — Telephone Encounter (Signed)
Please call patient and find out what the cholesterol medication, dose, and information that she is on. She did not bring it to the OV when she was seen. I do not see it in the computer. Janine Limbo. Tracie Harrier, MD

## 2017-05-01 MED ORDER — ATORVASTATIN CALCIUM 20 MG PO TABS: 20.0000 mg | ORAL_TABLET | Freq: Every day | ORAL | 3 refills | Status: DC

## 2017-05-04 ENCOUNTER — Telehealth: Payer: Self-pay | Admitting: Family Medicine

## 2017-05-04 NOTE — Telephone Encounter (Signed)
To you require an office visit or a Tetanus/Tdap injection, or can I just schedule a nurse visit?

## 2017-05-04 NOTE — Telephone Encounter (Signed)
Dusty sent meassage seeing if could schedule patient for nurse visit for tetanus shot. We just need to confirm when her last one was administered. Please call and find out when last shot was.  I am fine with her having a nurse visit for shot if she is due for shot. Janine Limbo. Tracie Harrier, MD

## 2017-05-05 NOTE — Telephone Encounter (Signed)
Called patient regarding message below. No answer, left generic message for patient to return call.  Patient needs to let us know when he last tetanus shot was.

## 2017-05-26 ENCOUNTER — Ambulatory Visit: Payer: Self-pay | Admitting: Family Medicine

## 2017-06-02 ENCOUNTER — Encounter: Payer: Self-pay | Admitting: Family Medicine

## 2017-06-02 ENCOUNTER — Ambulatory Visit (INDEPENDENT_AMBULATORY_CARE_PROVIDER_SITE_OTHER): Payer: Medicaid Other | Admitting: Family Medicine

## 2017-06-02 VITALS — BP 130/96 | HR 84 | Temp 98.4°F | Resp 16 | Ht 66.0 in | Wt 295.8 lb

## 2017-06-02 DIAGNOSIS — E782 Mixed hyperlipidemia: Secondary | ICD-10-CM

## 2017-06-02 DIAGNOSIS — I10 Essential (primary) hypertension: Secondary | ICD-10-CM

## 2017-06-02 DIAGNOSIS — R945 Abnormal results of liver function studies: Secondary | ICD-10-CM

## 2017-06-02 DIAGNOSIS — E039 Hypothyroidism, unspecified: Secondary | ICD-10-CM

## 2017-06-02 DIAGNOSIS — Z6841 Body Mass Index (BMI) 40.0 and over, adult: Secondary | ICD-10-CM | POA: Diagnosis not present

## 2017-06-02 DIAGNOSIS — Z23 Encounter for immunization: Secondary | ICD-10-CM | POA: Diagnosis not present

## 2017-06-02 DIAGNOSIS — R7989 Other specified abnormal findings of blood chemistry: Secondary | ICD-10-CM

## 2017-06-02 DIAGNOSIS — D649 Anemia, unspecified: Secondary | ICD-10-CM

## 2017-06-02 NOTE — Patient Instructions (Signed)
Exercising to Lose Weight Exercising can help you to lose weight. In order to lose weight through exercise, you need to do vigorous-intensity exercise. You can tell that you are exercising with vigorous intensity if you are breathing very hard and fast and cannot hold a conversation while exercising. Moderate-intensity exercise helps to maintain your current weight. You can tell that you are exercising at a moderate level if you have a higher heart rate and faster breathing, but you are still able to hold a conversation. How often should I exercise? Choose an activity that you enjoy and set realistic goals. Your health care provider can help you to make an activity plan that works for you. Exercise regularly as directed by your health care provider. This may include:  Doing resistance training twice each week, such as: ? Push-ups. ? Sit-ups. ? Lifting weights. ? Using resistance bands.  Doing a given intensity of exercise for a given amount of time. Choose from these options: ? 150 minutes of moderate-intensity exercise every week. ? 75 minutes of vigorous-intensity exercise every week. ? A mix of moderate-intensity and vigorous-intensity exercise every week.  Children, pregnant women, people who are out of shape, people who are overweight, and older adults may need to consult a health care provider for individual recommendations. If you have any sort of medical condition, be sure to consult your health care provider before starting a new exercise program. What are some activities that can help me to lose weight?  Walking at a rate of at least 4.5 miles an hour.  Jogging or running at a rate of 5 miles per hour.  Biking at a rate of at least 10 miles per hour.  Lap swimming.  Roller-skating or in-line skating.  Cross-country skiing.  Vigorous competitive sports, such as football, basketball, and soccer.  Jumping rope.  Aerobic dancing. How can I be more active in my day-to-day  activities?  Use the stairs instead of the elevator.  Take a walk during your lunch break.  If you drive, park your car farther away from work or school.  If you take public transportation, get off one stop early and walk the rest of the way.  Make all of your phone calls while standing up and walking around.  Get up, stretch, and walk around every 30 minutes throughout the day. What guidelines should I follow while exercising?  Do not exercise so much that you hurt yourself, feel dizzy, or get very short of breath.  Consult your health care provider prior to starting a new exercise program.  Wear comfortable clothes and shoes with good support.  Drink plenty of water while you exercise to prevent dehydration or heat stroke. Body water is lost during exercise and must be replaced.  Work out until you breathe faster and your heart beats faster. This information is not intended to replace advice given to you by your health care provider. Make sure you discuss any questions you have with your health care provider. Document Released: 08/16/2010 Document Revised: 12/20/2015 Document Reviewed: 12/15/2013 Elsevier Interactive Patient Education  2018 Elsevier Inc.  

## 2017-06-02 NOTE — Progress Notes (Signed)
[name], female    DOB: [birth year], 36 y.o.   MRN: [id number]  Chief Complaint  Patient presents with  . Follow-up  . Thyroid Problem    Allergies Patient has no known allergies.  Subjective:   April L Clovis RileyMitchell is a 36 y.o. female who [facility].  HPI Patient is here for follow-up.  She reports she has been taking all of her medications as directed.  She is fasting today so she can get her blood work done.  Did not take her blood pressure medication today because she has not eaten.  Reports she has been feeling better.  Has been trying to make a few changes in her diet and is trying to be more active.  Reports that her mood is stable.  Still being seen by mental health for her mood.  Reports she is a bit more motivated to make some good changes for her health.  Denies any chest pain, shortness of breath, or swelling in her extremities.  Was very happy to see on her blood work that she was not diabetic.  Sleeping okay at night.  Reports that her son has recently been in the hospital for a kidney stone but he is doing better.  Received her flu shot at the last visit.  Has not had her tetanus shot in over 10 years.  Reports she has never had high liver tests in the past.  Reports she has never been told she has hepatitis.  He is not sure if she received her hepatitis vaccinations.  Ports she has never had a time in her life where her eyes and her skin turned yellow color.  Denies alcohol or drug use.   Thyroid Problem  Presents for follow-up visit. Patient reports no anxiety, cold intolerance, constipation, diaphoresis, diarrhea, dry skin, fatigue, hair loss, heat intolerance, hoarse voice, leg swelling, menstrual problem, palpitations, tremors, visual change or weight gain. The symptoms have been improving. Her past medical history is significant for hyperlipidemia. There is no history of diabetes or heart failure.  Hyperlipidemia  This  is a chronic problem. Condition status: Has been taking her medication now for over 2 months. Recent lipid tests were reviewed and are high (Have been elevated in the past.). Exacerbating diseases include hypothyroidism and obesity. She has no history of chronic renal disease or diabetes. Factors aggravating her hyperlipidemia include fatty foods. Pertinent negatives include no chest pain, focal weakness, leg pain, myalgias or shortness of breath. Current antihyperlipidemic treatment includes statins and diet change. Compliance problems include adherence to diet and adherence to exercise.  Risk factors for coronary artery disease include family history, dyslipidemia, obesity, hypertension, a sedentary lifestyle and stress.  Hypertension  This is a chronic problem. The current episode started more than 1 year ago. The problem has been gradually improving since onset. The problem is controlled. Pertinent negatives include no blurred vision, chest pain, headaches, neck pain, palpitations, peripheral edema, PND or shortness of breath. There are no associated agents to hypertension. Past treatments include diuretics. The current treatment provides significant improvement. Compliance problems include diet and exercise.  There is no history of angina, kidney disease, CAD/MI, CVA, heart failure, left ventricular hypertrophy, PVD or retinopathy. There is no history of chronic renal disease.    Past Medical History:  Diagnosis Date  . Hyperlipidemia   . Hypertension   . Hypoactive thyroid   . Mental disorder    depression and PTSD  Past Surgical History:  Procedure Laterality Date  . CESAREAN SECTION      Family History  Problem Relation Age of Onset  . Diabetes Mother   . Hypertension Mother   . Hyperlipidemia Mother   . Kidney disease Mother   . Stroke Mother   . Diabetes Father   . Heart disease Father   . Liver disease Son   . Asthma Son   . Asthma Son   . Cancer Paternal Aunt       Social History   Socioeconomic History  . Marital status: Single    Spouse name: None  . Number of children: None  . Years of education: None  . Highest education level: None  Social Needs  . Financial resource strain: None  . Food insecurity - worry: None  . Food insecurity - inability: None  . Transportation needs - medical: None  . Transportation needs - non-medical: None  Occupational History  . None  Tobacco Use  . Smoking status: Never Smoker  . Smokeless tobacco: Never Used  Substance and Sexual Activity  . Alcohol use: Yes    Comment: occasionally  . Drug use: No  . Sexual activity: Yes    Partners: Male    Birth control/protection: None  Other Topics Concern  . None  Social History Narrative   Lives in [place], [place]. Dating. Enjoys reading and crafting. Has two children. Does not work outside the home. Children are in school.     Review of Systems  Constitutional: Negative for diaphoresis, fatigue and weight gain.  HENT: Negative for hoarse voice.   Eyes: Negative for blurred vision.  Respiratory: Negative for shortness of breath.   Cardiovascular: Negative for chest pain, palpitations and PND.  Gastrointestinal: Negative for constipation and diarrhea.  Endocrine: Negative for cold intolerance and heat intolerance.  Genitourinary: Negative for menstrual problem.  Musculoskeletal: Negative for myalgias and neck pain.  Neurological: Negative for tremors, focal weakness and headaches.  Psychiatric/Behavioral: The patient is not nervous/anxious.      Objective:   BP (!) 130/96 (BP Location: Right Arm, Patient Position: Sitting, Cuff Size: Normal)   Pulse 84   Temp 98.4 F (36.9 C) (Other (Comment))   Resp 16   Ht 5\' 6"  (1.676 m)   Wt 295 lb 12 oz (134.2 kg)   LMP 05/26/2017   SpO2 98%   BMI 47.74 kg/m   Physical Exam  Constitutional: She is oriented to person, place, and time. She appears well-developed and well-nourished. No distress.  HENT:   Head: Normocephalic and atraumatic.  Eyes: Conjunctivae and EOM are normal. Pupils are equal, round, and reactive to light.  Neck: Normal range of motion. Neck supple. No thyromegaly present.  Cardiovascular: Normal rate, regular rhythm and normal heart sounds.  Pulmonary/Chest: Effort normal and breath sounds normal. No respiratory distress. She has no wheezes.  Neurological: She is alert and oriented to person, place, and time. No cranial nerve deficit.  Skin: Skin is warm and dry.  Psychiatric: She has a normal mood and affect. Her behavior is normal.  Nursing note and vitals reviewed.    Assessment and Plan   1. Essential hypertension Controlled.  Patient did not take her medications today because she is fasting for labs.  She reports cannot take her blood pressure medication if she is fasting or makes her feel bad.  She is asked to continue to make dietary changes and decrease her salt intake.   2. Hypothyroidism, unspecified type Check thyroid  today and see if dose needs to be adjusted.  She has been on her thyroid medication for over 2 months now.  She reports compliance with therapy. - TSH  3. Mixed hyperlipidemia Patient has been taking her Lipitor every day as directed.  Dietary modifications discussed. - Lipid Profile  4. Elevated liver function tests Slightly elevated liver function test.  This was discussed with patient today.  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  Will check testing and if elevated will plan on obtaining an ultrasound of her liver.  Dietary changes and weight loss were recommended. - Hepatic function panel - Hepatitis panel, acute - Ferritin  5. Anemia, unspecified type - CBC with Differential   6. Class 3 severe obesity due to excess calories with serious comorbidity and body mass index (BMI) of 45.0 to 49.9 in adult [facility](HCC) The patient is asked to make an attempt to improve diet and exercise patterns to aid in medical  management of this problem.  Return in about 3 months (around 09/02/2017) for follow up.Aliene Beams. Ambermarie Honeyman, MD 06/02/2017

## 2017-06-03 LAB — LIPID PANEL
CHOL/HDL RATIO: 5.9 (calc) — AB (ref <5.0)
CHOLESTEROL: 208 mg/dL — AB (ref <200)
HDL: 35 mg/dL — ABNORMAL LOW (ref >50)
LDL CHOLESTEROL (CALC): 139 mg/dL — AB
NON-HDL CHOLESTEROL (CALC): 173 mg/dL — AB (ref <130)
Triglycerides: 203 mg/dL — ABNORMAL HIGH (ref <150)

## 2017-06-03 LAB — HEPATIC FUNCTION PANEL
AG Ratio: 1.7 (calc) (ref 1.0–2.5)
ALT: 39 U/L — ABNORMAL HIGH (ref 6–29)
AST: 23 U/L (ref 10–30)
Albumin: 4.6 g/dL (ref 3.6–5.1)
Alkaline phosphatase (APISO): 55 U/L (ref 33–115)
BILIRUBIN DIRECT: 0.2 mg/dL (ref 0.0–0.2)
BILIRUBIN INDIRECT: 0.6 mg/dL (ref 0.2–1.2)
BILIRUBIN TOTAL: 0.8 mg/dL (ref 0.2–1.2)
Globulin: 2.7 g/dL (calc) (ref 1.9–3.7)
Total Protein: 7.3 g/dL (ref 6.1–8.1)

## 2017-06-03 LAB — CBC WITH DIFFERENTIAL/PLATELET
BASOS PCT: 0.7 %
Basophils Absolute: 48 cells/uL (ref 0–200)
Eosinophils Absolute: 442 cells/uL (ref 15–500)
Eosinophils Relative: 6.4 %
HEMATOCRIT: 38.2 % (ref 35.0–45.0)
Hemoglobin: 13.1 g/dL (ref 11.7–15.5)
LYMPHS ABS: 2450 {cells}/uL (ref 850–3900)
MCH: 27.8 pg (ref 27.0–33.0)
MCHC: 34.3 g/dL (ref 32.0–36.0)
MCV: 80.9 fL (ref 80.0–100.0)
MPV: 9.7 fL (ref 7.5–12.5)
Monocytes Relative: 6.7 %
NEUTROS ABS: 3498 {cells}/uL (ref 1500–7800)
Neutrophils Relative %: 50.7 %
Platelets: 403 10*3/uL — ABNORMAL HIGH (ref 140–400)
RBC: 4.72 10*6/uL (ref 3.80–5.10)
RDW: 12.5 % (ref 11.0–15.0)
Total Lymphocyte: 35.5 %
WBC: 6.9 10*3/uL (ref 3.8–10.8)
WBCMIX: 462 {cells}/uL (ref 200–950)

## 2017-06-03 LAB — HEPATITIS PANEL, ACUTE
Hep A IgM: NONREACTIVE
Hep B C IgM: NONREACTIVE
Hepatitis B Surface Ag: NONREACTIVE
Hepatitis C Ab: NONREACTIVE
SIGNAL TO CUT-OFF: 0.01 (ref <1.00)

## 2017-06-03 LAB — TSH: TSH: 4.7 m[IU]/L — AB

## 2017-06-03 LAB — FERRITIN: Ferritin: 26 ng/mL (ref 10–154)

## 2017-06-11 ENCOUNTER — Encounter: Payer: Self-pay | Admitting: Family Medicine

## 2017-06-11 ENCOUNTER — Telehealth: Payer: Self-pay | Admitting: Family Medicine

## 2017-06-11 MED ORDER — LEVOTHYROXINE SODIUM 150 MCG PO TABS: 150.0000 ug | ORAL_TABLET | Freq: Every day | ORAL | 0 refills | Status: DC

## 2017-06-11 NOTE — Telephone Encounter (Signed)
Patient informed of message below, verbalized understanding.  

## 2017-06-11 NOTE — Telephone Encounter (Signed)
Please advise patient that her thyroid is still under active and we need to increase her thyroid medication at this time.  Please advise her to discontinue the levothyroxine 137 mcg, 1 p.o. daily.  Please advise her to start levothyroxine 150 mcg, 1 p.o. daily.  Advised her that I have sent this medication and to [facility].  Please advise her that she will need a follow-up appointment in 2-3 months after making this dose change.  Her cholesterol is still not to goal.  However until her thyroid is improved I do not want to increase her medication at this time.  We will discuss her labs at her follow-up.  I have sent her a letter in the mail with her lab testing.

## 2017-09-02 ENCOUNTER — Ambulatory Visit: Payer: Medicaid Other | Admitting: Family Medicine

## 2017-09-25 ENCOUNTER — Other Ambulatory Visit: Payer: Self-pay

## 2017-09-25 ENCOUNTER — Encounter: Payer: Self-pay | Admitting: Family Medicine

## 2017-09-25 ENCOUNTER — Ambulatory Visit (INDEPENDENT_AMBULATORY_CARE_PROVIDER_SITE_OTHER): Payer: Medicaid Other | Admitting: Family Medicine

## 2017-09-25 VITALS — BP 130/78 | HR 77 | Temp 98.0°F | Resp 16 | Ht 65.0 in | Wt 302.0 lb

## 2017-09-25 DIAGNOSIS — I10 Essential (primary) hypertension: Secondary | ICD-10-CM

## 2017-09-25 DIAGNOSIS — E782 Mixed hyperlipidemia: Secondary | ICD-10-CM

## 2017-09-25 DIAGNOSIS — R945 Abnormal results of liver function studies: Secondary | ICD-10-CM

## 2017-09-25 DIAGNOSIS — Z6841 Body Mass Index (BMI) 40.0 and over, adult: Secondary | ICD-10-CM

## 2017-09-25 DIAGNOSIS — R7989 Other specified abnormal findings of blood chemistry: Secondary | ICD-10-CM

## 2017-09-25 DIAGNOSIS — E039 Hypothyroidism, unspecified: Secondary | ICD-10-CM

## 2017-09-25 LAB — LIPID PANEL
CHOL/HDL RATIO: 4.7 (calc) (ref <5.0)
Cholesterol: 150 mg/dL (ref <200)
HDL: 32 mg/dL — ABNORMAL LOW (ref >50)
LDL CHOLESTEROL (CALC): 91 mg/dL
NON-HDL CHOLESTEROL (CALC): 118 mg/dL (ref <130)
TRIGLYCERIDES: 173 mg/dL — AB (ref <150)

## 2017-09-25 LAB — BASIC METABOLIC PANEL
BUN: 11 mg/dL (ref 7–25)
CALCIUM: 9.4 mg/dL (ref 8.6–10.2)
CHLORIDE: 103 mmol/L (ref 98–110)
CO2: 27 mmol/L (ref 20–32)
Creat: 0.56 mg/dL (ref 0.50–1.10)
GLUCOSE: 109 mg/dL — AB (ref 65–99)
Potassium: 4.3 mmol/L (ref 3.5–5.3)
Sodium: 138 mmol/L (ref 135–146)

## 2017-09-25 LAB — HEPATIC FUNCTION PANEL
AG Ratio: 2 (calc) (ref 1.0–2.5)
ALKALINE PHOSPHATASE (APISO): 63 U/L (ref 33–115)
ALT: 24 U/L (ref 6–29)
AST: 12 U/L (ref 10–30)
Albumin: 4.3 g/dL (ref 3.6–5.1)
BILIRUBIN DIRECT: 0.1 mg/dL (ref 0.0–0.2)
BILIRUBIN INDIRECT: 0.4 mg/dL (ref 0.2–1.2)
GLOBULIN: 2.2 g/dL (ref 1.9–3.7)
Total Bilirubin: 0.5 mg/dL (ref 0.2–1.2)
Total Protein: 6.5 g/dL (ref 6.1–8.1)

## 2017-09-25 LAB — TSH: TSH: 0.77 m[IU]/L

## 2017-09-25 NOTE — Patient Instructions (Signed)
Obesity, Adult Obesity is having too much body fat. If you have a BMI of 30 or more, you are obese. BMI is a number that explains how much body fat you have. Obesity is often caused by taking in (consuming) more calories than your body uses. Obesity can cause serious health problems. Changing your lifestyle can help to treat obesity. Follow these instructions at home: Eating and drinking   Follow advice from your doctor about what to eat and drink. Your doctor may tell you to: ? Cut down on (limit) fast foods, sweets, and processed snack foods. ? Choose low-fat options. For example, choose low-fat milk instead of whole milk. ? Eat 5 or more servings of fruits or vegetables every day. ? Eat at home more often. This gives you more control over what you eat. ? Choose healthy foods when you eat out. ? Learn what a healthy portion size is. A portion size is the amount of a certain food that is healthy for you to eat at one time. This is different for each person. ? Keep low-fat snacks available. ? Avoid sugary drinks. These include soda, fruit juice, iced tea that is sweetened with sugar, and flavored milk. ? Eat a healthy breakfast.  Drink enough water to keep your pee (urine) clear or pale yellow.  Do not go without eating for long periods of time (do not fast).  Do not go on popular or trendy diets (fad diets). Physical Activity  Exercise often, as told by your doctor. Ask your doctor: ? What types of exercise are safe for you. ? How often you should exercise.  Warm up and stretch before being active.  Do slow stretching after being active (cool down).  Rest between times of being active. Lifestyle  Limit how much time you spend in front of your TV, computer, or video game system (be less sedentary).  Find ways to reward yourself that do not involve food.  Limit alcohol intake to no more than 1 drink a day for nonpregnant women and 2 drinks a day for men. One drink equals 12 oz  of beer, 5 oz of wine, or 1 oz of hard liquor. General instructions  Keep a weight loss journal. This can help you keep track of: ? The food that you eat. ? The exercise that you do.  Take over-the-counter and prescription medicines only as told by your doctor.  Take vitamins and supplements only as told by your doctor.  Think about joining a support group. Your doctor may be able to help with this.  Keep all follow-up visits as told by your doctor. This is important. Contact a doctor if:  You cannot meet your weight loss goal after you have changed your diet and lifestyle for 6 weeks. This information is not intended to replace advice given to you by your health care provider. Make sure you discuss any questions you have with your health care provider. Document Released: 10/06/2011 Document Revised: 12/20/2015 Document Reviewed: 05/02/2015 Elsevier Interactive Patient Education  2018 Elsevier Inc.  

## 2017-09-25 NOTE — Progress Notes (Signed)
[name], female    DOB: 19-Sep-1980, 37 y.o.   MRN: [id number]  Chief Complaint  Patient presents with  . Follow-up    Allergies Patient has no known allergies.  Subjective:   April Carney is a 37 y.o. female who presents to [facility] today.  HPI Here to follow up.  She reports that she is doing well.  Her mood is good.  [facility].  Taking her blood pressure medication each day.  Denies any chest pain, shortness of breath, headaches, or swelling in her extremities.  Reports that she has been trying to work on her diet but she has not been doing any exercise.  She reports she is trying to cut down on the number of sodas that she drinks.  She still does eat out at fast food and restaurants a good bit.  She reports that she knows she needs to lose weight.  She thought she would have lost some weight today but she is not.  She is taking her cholesterol medication each night.  Denies any myalgias.  Reports that her energy level is still low.  Feels tired on and off throughout the day.  Reports that she does not always sleep well at night.  Reports that she has been sleeping some during the day when her kids are at school and believe she is got her sleep schedule out of sync.  She denies any depression.  No side effects with her medication.  Does not feel down, depressed, or hopeless.   Hypertension  This is a chronic problem. The current episode started more than 1 year ago. The problem has been waxing and waning since onset. The problem is controlled. Pertinent negatives include no anxiety, blurred vision, chest pain, headaches, malaise/fatigue, neck pain, orthopnea, palpitations, peripheral edema or shortness of breath. There are no associated agents to hypertension. Risk factors for coronary artery disease include dyslipidemia, obesity and stress. Past treatments include diuretics. The current treatment provides moderate improvement.  There are no compliance problems.  There is no history of angina, kidney disease, CAD/MI, CVA, heart failure or left ventricular hypertrophy.    Past Medical History:  Diagnosis Date  . Hyperlipidemia   . Hypertension   . Hypoactive thyroid   . Mental disorder    depression and PTSD    Past Surgical History:  Procedure Laterality Date  . CESAREAN SECTION      Family History  Problem Relation Age of Onset  . Diabetes Mother   . Hypertension Mother   . Hyperlipidemia Mother   . Kidney disease Mother   . Stroke Mother   . Diabetes Father   . Heart disease Father   . Liver disease Son   . Asthma Son   . Asthma Son   . Cancer Paternal Aunt      Social History   Socioeconomic History  . Marital status: Single    Spouse name: None  . Number of children: None  . Years of education: None  . Highest education level: None  Social Needs  . Financial resource strain: None  . Food insecurity - worry: None  . Food insecurity - inability: None  . Transportation needs - medical: None  . Transportation needs - non-medical: None  Occupational History  . None  Tobacco Use  . Smoking status: Never Smoker  . Smokeless tobacco: Never Used  Substance and Sexual Activity  . Alcohol use: Yes  Comment: occasionally  . Drug use: No  . Sexual activity: Yes    Partners: Male    Birth control/protection: None  Other Topics Concern  . None  Social History Narrative   Lives in [place], [place]. Dating. Enjoys reading and crafting. Has two children. Does not work outside the home. Children are in school.    Current Outpatient Medications on File Prior to Visit  Medication Sig Dispense Refill  . acetaminophen (TYLENOL) 325 MG tablet Take 650 mg by mouth every 6 (six) hours as needed.    Marland Kitchen atorvastatin (LIPITOR) 20 MG tablet Take 1 tablet (20 mg total) by mouth daily. 90 tablet 3  . diphenhydrAMINE (BENADRYL) 25 MG tablet Take 25 mg by mouth every 6 (six) hours as needed.    Marland Kitchen  FLUoxetine (PROZAC) 20 MG tablet Take 20 mg by mouth daily.    . hydrochlorothiazide (MICROZIDE) 12.5 MG capsule Take 1 capsule (12.5 mg total) by mouth daily. 30 capsule 12  . ibuprofen (ADVIL,MOTRIN) 200 MG tablet Take 600 mg by mouth every 8 (eight) hours as needed for moderate pain. Reported on 12/11/2015    . levothyroxine (SYNTHROID, LEVOTHROID) 150 MCG tablet Take 1 tablet (150 mcg total) daily by mouth. 90 tablet 0  . traZODone (DESYREL) 50 MG tablet Take 50 mg by mouth at bedtime.     No current facility-administered medications on file prior to visit.     Review of Systems  Constitutional: Positive for fatigue. Negative for activity change, chills, diaphoresis, malaise/fatigue and unexpected weight change.  HENT: Negative for trouble swallowing and voice change.   Eyes: Negative for blurred vision and visual disturbance.  Respiratory: Negative for cough, choking, chest tightness and shortness of breath.   Cardiovascular: Negative for chest pain, palpitations, orthopnea and leg swelling.  Musculoskeletal: Negative for myalgias and neck pain.  Skin: Negative for rash.  Neurological: Negative for syncope, weakness, numbness and headaches.  Hematological: Negative for adenopathy. Does not bruise/bleed easily.  Psychiatric/Behavioral: Negative for dysphoric mood. The patient is not nervous/anxious.      Objective:   BP 130/78 (BP Location: Left Arm, Patient Position: Sitting, Cuff Size: Normal)   Pulse 77   Temp 98 F (36.7 C) (Temporal)   Resp 16   Ht 5\' 5"  (1.651 m)   Wt (!) 302 lb (137 kg)   LMP 09/21/2017   SpO2 96%   BMI 50.26 kg/m  Gained 7 pounds  Physical Exam  Constitutional: She is oriented to person, place, and time. She appears well-developed and well-nourished.  HENT:  Head: Normocephalic and atraumatic.  Eyes: EOM are normal. Pupils are equal, round, and reactive to light.  Neck: Normal range of motion. Neck supple. No JVD present. No tracheal deviation  present. No thyromegaly present.  Cardiovascular: Normal rate, regular rhythm and normal heart sounds.  Pulmonary/Chest: Effort normal and breath sounds normal. She has no wheezes.  Lymphadenopathy:    She has no cervical adenopathy.  Neurological: She is alert and oriented to person, place, and time.  Skin: Skin is warm and dry.  Psychiatric: She has a normal mood and affect. Her behavior is normal. Judgment and thought content normal.  Is dressed but she has not well-groomed.  She does have significant body odor.  She appears that she has not showered in several days.  Mood euthymic.  Affect congruent with mood.  Vitals reviewed.    Assessment and Plan  1. Hypothyroidism, unspecified type Still elevated 3 months ago.  Reports compliance with  her levothyroxine daily.  Recheck TSH today. - TSH  2. Mixed hyperlipidemia Continue Lipitor nightly. Hyperlipidemia and the associated risk of ASCVD were discussed today.   Risks of stains discussed including myopathy, rhabdomyoloysis, liver problems, increased risk of diabetes discussed. We discussed heart healthy diet, lifestyle modifications, risk factor modifications, and adherence to the recommended treatment plan. We discussed the need to periodically monitor lipid panel and liver function tests while on statin therapy.   - Lipid panel  3. Elevated liver function tests With patient that I suspect her slightly elevated liver functions are secondary to hepatic steatosis secondary to her morbid obesity.  She was had again encouraged to make dietary and lifestyle modifications that would help her to lose weight and decrease her cardiovascular risk. - Hepatic function panel  4. Essential hypertension Controlled.  Continue medication.  Check BMP today. Lifestyle modifications discussed with patient including a diet emphasizing vegetables, fruits, and whole grains. Limiting intake of sodium to less than 2,400 mg per day.  Recommendations  discussed include consuming low-fat dairy products, poultry, fish, legumes, non-tropical vegetable oils, and nuts; and limiting intake of sweets, sugar-sweetened beverages, and red meat. Discussed following a plan such as the Dietary Approaches to Stop Hypertension (DASH) diet. Patient to read up on this diet.   - Basic metabolic panel  5. Class 3 severe obesity due to excess calories with serious comorbidity and body mass index (BMI) of 50.0 to 59.9 in adult [facility]) She reports that she has been making changes in her diet but she does no exercise.  She has gained 7 pounds since her last visit.  She was encouraged today to participate in at least 10-15 minutes of exercise a day as a start.  She was encouraged to make dietary changes and decrease her soda intake and fast food intake.  She defers nutritionist referral at this time.  OV greater than 25 minutes, greater than 50% time spend counseling regarding above.  Return in about 3 months (around 12/26/2017) for follow up. Aliene Beams, MD 09/25/2017

## 2017-09-27 ENCOUNTER — Other Ambulatory Visit: Payer: Self-pay | Admitting: Family Medicine

## 2017-09-30 ENCOUNTER — Encounter: Payer: Self-pay | Admitting: Family Medicine

## 2017-11-09 ENCOUNTER — Other Ambulatory Visit: Payer: Self-pay | Admitting: Family Medicine

## 2017-11-26 ENCOUNTER — Encounter: Payer: Self-pay | Admitting: Family Medicine

## 2017-11-27 ENCOUNTER — Encounter (HOSPITAL_COMMUNITY): Payer: Self-pay | Admitting: Emergency Medicine

## 2017-11-27 ENCOUNTER — Other Ambulatory Visit: Payer: Self-pay

## 2017-11-27 ENCOUNTER — Emergency Department (HOSPITAL_COMMUNITY)
Admission: EM | Admit: 2017-11-27 | Discharge: 2017-11-27 | Disposition: A | Discharge status: Home / Self Care | Payer: Medicaid Other | Attending: Emergency Medicine | Admitting: Emergency Medicine

## 2017-11-27 DIAGNOSIS — R0981 Nasal congestion: Secondary | ICD-10-CM | POA: Diagnosis present

## 2017-11-27 DIAGNOSIS — B9789 Other viral agents as the cause of diseases classified elsewhere: Secondary | ICD-10-CM

## 2017-11-27 DIAGNOSIS — J069 Acute upper respiratory infection, unspecified: Secondary | ICD-10-CM | POA: Insufficient documentation

## 2017-11-27 DIAGNOSIS — R05 Cough: Secondary | ICD-10-CM | POA: Insufficient documentation

## 2017-11-27 DIAGNOSIS — I10 Essential (primary) hypertension: Secondary | ICD-10-CM | POA: Diagnosis not present

## 2017-11-27 DIAGNOSIS — E785 Hyperlipidemia, unspecified: Secondary | ICD-10-CM | POA: Insufficient documentation

## 2017-11-27 DIAGNOSIS — Z79899 Other long term (current) drug therapy: Secondary | ICD-10-CM | POA: Insufficient documentation

## 2017-11-27 LAB — GROUP A STREP BY PCR: Group A Strep by PCR: NOT DETECTED

## 2017-11-27 MED ORDER — BENZONATATE 200 MG PO CAPS: 200.0000 mg | ORAL_CAPSULE | Freq: Three times a day (TID) | ORAL | 0 refills | Status: DC

## 2017-11-27 MED ORDER — MAGIC MOUTHWASH W/LIDOCAINE: 5.0000 mL | Freq: Three times a day (TID) | ORAL | 0 refills | Status: DC | PRN

## 2017-11-27 MED ORDER — IBUPROFEN 600 MG PO TABS: 600.0000 mg | ORAL_TABLET | Freq: Four times a day (QID) | ORAL | 0 refills | Status: DC | PRN

## 2017-11-27 NOTE — Discharge Instructions (Addendum)
Drink plenty of fluids.  Follow-up with your primary doctor for recheck if needed return here for any worsening symptoms

## 2017-11-27 NOTE — ED Triage Notes (Signed)
Patient c/o cough and sore throat x2 days. Denies any fevers. Per patient cough occasionally productive. Patient states that sore throat was severe last night. Per patient taking zyrtec x2 days with no relief.

## 2017-11-27 NOTE — ED Provider Notes (Signed)
[facility] EMERGENCY DEPARTMENT Provider Note   CSN: [id number] Arrival date & time: 11/27/17  1026     History   Chief Complaint Chief Complaint  Patient presents with  . Cough    HPI April Carney is a 37 y.o. female.  HPI  April Carney is a 37 y.o. female who presents to the Emergency Department complaining of nasal congestion, sore throat, and cough.  Symptoms present for 2 days.  States sore throat became severe last evening.  Cough is mostly non-productive. She has been taking zyrtec without relief.  She denies fevers, chills, chest pain or shortness of breath. No sick contacts.     Past Medical History:  Diagnosis Date  . Hyperlipidemia   . Hypertension   . Hypoactive thyroid   . Mental disorder    depression and PTSD    Patient Active Problem List   Diagnosis Date Noted  . Elevated liver function tests 06/02/2017  . Well woman exam with routine gynecological exam 07/01/2016  . Superficial fungus infection of skin 07/01/2016  . Essential hypertension 07/01/2016  . Pre-diabetes 12/12/2015  . Hyperlipidemia 12/12/2015  . Situational depression 12/11/2015  . Family h/o Biliary atresia 04/24/2015  . History of preterm delivery 04/24/2015  . History of cesarean section 04/24/2015  . Obesity 04/24/2015  . Hypothyroidism 04/24/2015    Past Surgical History:  Procedure Laterality Date  . CESAREAN SECTION       OB History    Gravida  2   Para  2   Term  1   Preterm  1   AB      Living  2     SAB      TAB      Ectopic      Multiple      Live Births  2            Home Medications    Prior to Admission medications   Medication Sig Start Date End Date Taking? Authorizing Provider  acetaminophen (TYLENOL) 325 MG tablet Take 650 mg by mouth every 6 (six) hours as needed.    [provider]  atorvastatin (LIPITOR) 20 MG tablet Take 1 tablet (20 mg total) by mouth daily. 05/01/17   Aliene Beams, MD  benzonatate  (TESSALON) 200 MG capsule Take 1 capsule (200 mg total) by mouth every 8 (eight) hours. 11/27/17   Miche Loughridge, PA-C  diphenhydrAMINE (BENADRYL) 25 MG tablet Take 25 mg by mouth every 6 (six) hours as needed.    [provider]  FLUoxetine (PROZAC) 20 MG tablet Take 20 mg by mouth daily.    [provider]  hydrochlorothiazide (MICROZIDE) 12.5 MG capsule Take 1 capsule (12.5 mg total) by mouth daily. 03/26/17   Aliene Beams, MD  ibuprofen (ADVIL,MOTRIN) 600 MG tablet Take 1 tablet (600 mg total) by mouth every 6 (six) hours as needed. 11/27/17   Sarrah Fiorenza, PA-C  levothyroxine (SYNTHROID, LEVOTHROID) 150 MCG tablet TAKE 1 TABLET BY MOUTH ONCE DAILY 11/09/17   Aliene Beams, MD  magic mouthwash w/lidocaine SOLN Take 5 mLs by mouth 3 (three) times daily as needed for mouth pain. 11/27/17   Daeron Carreno, PA-C  traZODone (DESYREL) 50 MG tablet Take 50 mg by mouth at bedtime.    [provider]    Family History Family History  Problem Relation Age of Onset  . Diabetes Mother   . Hypertension Mother   . Hyperlipidemia Mother   . Kidney disease Mother   .  Stroke Mother   . Diabetes Father   . Heart disease Father   . Liver disease Son   . Asthma Son   . Asthma Son   . Cancer Paternal Aunt     Social History Social History   Tobacco Use  . Smoking status: Never Smoker  . Smokeless tobacco: Never Used  Substance Use Topics  . Alcohol use: Yes    Comment: occasionally  . Drug use: No     Allergies   Patient has no known allergies.   Review of Systems Review of Systems  Constitutional: Negative for activity change, appetite change, chills and fever.  HENT: Positive for congestion and sore throat. Negative for facial swelling, rhinorrhea and trouble swallowing.   Eyes: Negative for visual disturbance.  Respiratory: Positive for cough. Negative for shortness of breath, wheezing and stridor.   Cardiovascular: Negative for chest pain.    Gastrointestinal: Negative for nausea and vomiting.  Musculoskeletal: Negative for arthralgias, neck pain and neck stiffness.  Skin: Negative for rash.  Neurological: Negative for dizziness, weakness, numbness and headaches.  Hematological: Negative for adenopathy.  Psychiatric/Behavioral: Negative for confusion.  All other systems reviewed and are negative.    Physical Exam Updated Vital Signs BP 124/80 (BP Location: Right Arm)   Pulse 87   Temp 98.9 F (37.2 C) (Oral)   Resp 16   Ht 5' (1.524 m)   Wt 131.5 kg (290 lb)   LMP 11/27/2017   SpO2 98%   BMI 56.64 kg/m   Physical Exam  Constitutional: She is oriented to person, place, and time. She appears well-developed and well-nourished. No distress.  HENT:  Head: Normocephalic and atraumatic.  Right Ear: Tympanic membrane and ear canal normal.  Left Ear: Tympanic membrane and ear canal normal.  Nose: Mucosal edema and rhinorrhea present.  Mouth/Throat: Uvula is midline and mucous membranes are normal. No trismus in the jaw. No uvula swelling. Posterior oropharyngeal erythema present. No oropharyngeal exudate, posterior oropharyngeal edema or tonsillar abscesses.  Eyes: Conjunctivae are normal.  Neck: Normal range of motion and phonation normal. Neck supple. No Brudzinski's sign and no Kernig's sign noted.  Cardiovascular: Normal rate, regular rhythm and intact distal pulses.  No murmur heard. Pulmonary/Chest: Effort normal and breath sounds normal. No respiratory distress. She has no wheezes. She has no rales.  Abdominal: Soft. She exhibits no distension. There is no tenderness. There is no rebound and no guarding.  Musculoskeletal: She exhibits no edema.  Lymphadenopathy:    She has no cervical adenopathy.  Neurological: She is alert and oriented to person, place, and time. She exhibits normal muscle tone. Coordination normal.  Skin: Skin is warm and dry.  Psychiatric: She has a normal mood and affect.  Nursing note and  vitals reviewed.    ED Treatments / Results  Labs (all labs ordered are listed, but only abnormal results are displayed) Labs Reviewed  GROUP A STREP BY PCR    EKG None  Radiology No results found.  Procedures Procedures (including critical care time)  Medications Ordered in ED Medications - No data to display   Initial Impression / Assessment and Plan / ED Course  I have reviewed the triage vital signs and the nursing notes.  Pertinent labs & imaging results that were available during my care of the patient were reviewed by me and considered in my medical decision making (see chart for details).     Pt well apearing.  PERC neg.  Strep neg.  Lungs clear.  Sx's likely viral.  She agrees to tx plan, PCP f/u and return precautions discussed.   Final Clinical Impressions(s) / ED Diagnoses   Final diagnoses:  Viral URI with cough    ED Discharge Orders        Ordered    magic mouthwash w/lidocaine SOLN  3 times daily PRN     11/27/17 1207    ibuprofen (ADVIL,MOTRIN) 600 MG tablet  Every 6 hours PRN     11/27/17 1207    benzonatate (TESSALON) 200 MG capsule  Every 8 hours     11/27/17 1207       Pauline Aus, PA-C 11/27/17 2321    Donnetta Hutching, MD 11/28/17 [phone]

## 2017-12-29 ENCOUNTER — Encounter: Payer: Self-pay | Admitting: Family Medicine

## 2017-12-29 ENCOUNTER — Ambulatory Visit: Payer: Medicaid Other | Admitting: Family Medicine

## 2017-12-29 ENCOUNTER — Other Ambulatory Visit: Payer: Self-pay

## 2017-12-29 ENCOUNTER — Telehealth: Payer: Self-pay

## 2017-12-29 VITALS — BP 118/80 | HR 82 | Temp 98.7°F | Resp 14 | Ht 65.0 in | Wt 305.0 lb

## 2017-12-29 DIAGNOSIS — F339 Major depressive disorder, recurrent, unspecified: Secondary | ICD-10-CM | POA: Diagnosis not present

## 2017-12-29 DIAGNOSIS — E66813 Obesity, class 3: Secondary | ICD-10-CM

## 2017-12-29 DIAGNOSIS — Z6841 Body Mass Index (BMI) 40.0 and over, adult: Secondary | ICD-10-CM | POA: Diagnosis not present

## 2017-12-29 DIAGNOSIS — E7800 Pure hypercholesterolemia, unspecified: Secondary | ICD-10-CM

## 2017-12-29 DIAGNOSIS — R7303 Prediabetes: Secondary | ICD-10-CM

## 2017-12-29 DIAGNOSIS — I10 Essential (primary) hypertension: Secondary | ICD-10-CM

## 2017-12-29 DIAGNOSIS — E039 Hypothyroidism, unspecified: Secondary | ICD-10-CM | POA: Diagnosis not present

## 2017-12-29 NOTE — Telephone Encounter (Signed)
[facility] - left message.  

## 2017-12-29 NOTE — Progress Notes (Signed)
[name], female    DOB: 14-Dec-1980, 37 y.o.   MRN: [id number]  Chief Complaint  Patient presents with  . Hypothyroidism    medication + lab follow up    Allergies Patient has no known allergies.  Subjective:   April Carney is a 37 y.o. female who presents to [facility] today.  HPI Here for follow up.  Reports that she has been taking her medication as directed.  She reports that she would like to get a referral to a therapist.  Reports that she can no longer be seen at  [facility] b/c missed appointment with therapist. Because she missed her appointment with therapist they discharged her. She gets her prozac from them and sees the therapist.  Mood is been well controlled on the Prozac.  She reports she takes it every day.  She denies any suicidal or homicidal ideations.  She has a history of anxiety and depression and PTSD.  She reports that she uses trazodone at bedtime for sleep.  She denies any side effects with the medication.  She is sleeping better than she was at her last visit.  She reports that she would like to see the therapist due to her PTSD.  She reports that she does have flashbacks related to her son's illness and the illness of her husband.  Reports compliance with her thyroid medication.  Is getting TSH checked today due to the fact that her level dropped dramatically at her last check 3 months ago.  Has lost 3 pounds since her last visit.  Reports she has cut down on her sodas.  Does not do any form of exercise.  Denies any swelling in her extremities, constipation, palpitations, chest pain, shortness of breath.  Reports that her blood pressure has been running well.  Takes HCTZ daily.  Denies any muscle cramps. Has also been taking her cholesterol medication each day.  No myalgias.  Does report she still tends to eat out a lot but is only drinking sodas when she goes out to eat.   Past Medical History:  Diagnosis Date  .  Hyperlipidemia   . Hypertension   . Hypoactive thyroid   . Mental disorder    depression and PTSD    Past Surgical History:  Procedure Laterality Date  . CESAREAN SECTION      Family History  Problem Relation Age of Onset  . Diabetes Mother   . Hypertension Mother   . Hyperlipidemia Mother   . Kidney disease Mother   . Stroke Mother   . Diabetes Father   . Heart disease Father   . Liver disease Son   . Asthma Son   . Asthma Son   . Cancer Paternal Aunt      Social History   Socioeconomic History  . Marital status: Single    Spouse name: Not on file  . Number of children: Not on file  . Years of education: Not on file  . Highest education level: Not on file  Occupational History  . Not on file  Social Needs  . Financial resource strain: Not on file  . Food insecurity:    Worry: Not on file    Inability: Not on file  . Transportation needs:    Medical: Not on file    Non-medical: Not on file  Tobacco Use  . Smoking status: Never Smoker  . Smokeless tobacco: Never Used  Substance and Sexual Activity  .  Alcohol use: Yes    Comment: occasionally  . Drug use: No  . Sexual activity: Yes    Partners: Male    Birth control/protection: None  Lifestyle  . Physical activity:    Days per week: Not on file    Minutes per session: Not on file  . Stress: Not on file  Relationships  . Social connections:    Talks on phone: Not on file    Gets together: Not on file    Attends religious service: Not on file    Active member of club or organization: Not on file    Attends meetings of clubs or organizations: Not on file    Relationship status: Not on file  Other Topics Concern  . Not on file  Social History Narrative   Lives in [place], [place]. Dating. Enjoys reading and crafting. Has two children. Does not work outside the home. Children are in school.    Current Outpatient Medications on File Prior to Visit  Medication Sig Dispense Refill  . acetaminophen  (TYLENOL) 325 MG tablet Take 650 mg by mouth every 6 (six) hours as needed.    Marland Kitchen atorvastatin (LIPITOR) 20 MG tablet Take 1 tablet (20 mg total) by mouth daily. 90 tablet 3  . cetirizine (ZYRTEC) 10 MG tablet Take 10 mg by mouth daily.    . diphenhydrAMINE (BENADRYL) 25 MG tablet Take 25 mg by mouth every 6 (six) hours as needed.    Marland Kitchen FLUoxetine (PROZAC) 40 MG capsule Take 40 mg by mouth daily.  3  . hydrochlorothiazide (MICROZIDE) 12.5 MG capsule Take 1 capsule (12.5 mg total) by mouth daily. 30 capsule 12  . ibuprofen (ADVIL,MOTRIN) 600 MG tablet Take 1 tablet (600 mg total) by mouth every 6 (six) hours as needed. 30 tablet 0  . levothyroxine (SYNTHROID, LEVOTHROID) 150 MCG tablet TAKE 1 TABLET BY MOUTH ONCE DAILY 90 tablet 0  . traZODone (DESYREL) 50 MG tablet Take 50 mg by mouth at bedtime.     No current facility-administered medications on file prior to visit.     Review of Systems  Constitutional: Negative for activity change, appetite change, fatigue, fever and unexpected weight change.  HENT: Negative for tinnitus and voice change.   Eyes: Negative for visual disturbance.  Respiratory: Negative for cough, chest tightness and shortness of breath.   Cardiovascular: Negative for chest pain, palpitations and leg swelling.  Gastrointestinal: Negative for abdominal pain, nausea and vomiting.  Genitourinary: Negative for dysuria, frequency and urgency.  Musculoskeletal: Negative for myalgias.  Skin: Negative for rash.  Neurological: Negative for dizziness, syncope and light-headedness.  Hematological: Negative for adenopathy.  Psychiatric/Behavioral: Negative for behavioral problems, confusion, decreased concentration, dysphoric mood, self-injury, sleep disturbance and suicidal ideas. The patient is not nervous/anxious.      Objective:   BP 118/80 (BP Location: Left Arm, Patient Position: Sitting, Cuff Size: Large)   Pulse 82   Temp 98.7 F (37.1 C) (Temporal)   Resp 14   Ht 5'  5" (1.651 m)   Wt (!) 305 lb 0.6 oz (138.4 kg)   SpO2 99%   BMI 50.76 kg/m   Physical Exam  Constitutional: She is oriented to person, place, and time. She appears well-developed and well-nourished. No distress.  HENT:  Head: Normocephalic and atraumatic.  Eyes: Pupils are equal, round, and reactive to light.  Neck: Normal range of motion. Neck supple. No thyromegaly present.  Cardiovascular: Normal rate, regular rhythm and normal heart sounds.  Pulmonary/Chest: Effort normal  and breath sounds normal. No respiratory distress.  Neurological: She is alert and oriented to person, place, and time. No cranial nerve deficit.  Skin: Skin is warm and dry.  Psychiatric: She has a normal mood and affect. Her behavior is normal. Judgment and thought content normal.  Nursing note and vitals reviewed.   Depression screen [facility] 2/9 12/29/2017 09/25/2017 03/26/2017 07/30/2016 07/01/2016  Decreased Interest 1 0 0 0 1  Down, Depressed, Hopeless 0 0 0 0 1  PHQ - 2 Score 1 0 0 0 2  Altered sleeping - - - - 1  Tired, decreased energy - - - - 2  Change in appetite - - - - 2  Feeling bad or failure about yourself  - - - - 1  Trouble concentrating - - - - 0  Moving slowly or fidgety/restless - - - - 0  Suicidal thoughts - - - - 0  PHQ-9 Score - - - - 8    Assessment and Plan  1. Essential hypertension Check BMP. Lifestyle modifications discussed with patient including a diet emphasizing vegetables, fruits, and whole grains. Limiting intake of sodium to less than 2,400 mg per day.  Recommendations discussed include consuming low-fat dairy products, poultry, fish, legumes, non-tropical vegetable oils, and nuts; and limiting intake of sweets, sugar-sweetened beverages, and red meat. Discussed following a plan such as the Dietary Approaches to Stop Hypertension (DASH) diet. Patient to read up on this diet.  Continue HCTZ qd.  - Basic metabolic panel  2. Acquired hypothyroidism - TSH Compliance with medication  recommended.  Check labs today.  If TSH is within normal range we will not need to be checked until 6 months. 3. Pure hypercholesterolemia Continue statin medication as directed.  We did discuss recommended lifestyle and dietary modifications.  4. Class 3 severe obesity due to excess calories with serious comorbidity and body mass index (BMI) of 45.0 to 49.9 in adult [facility](H[facility]) Patient has lost 3 pounds since her last visit.  She is recommended to continue with her weight loss. The patient is asked to make an attempt to improve diet and exercise patterns to aid in medical management of this problem.   5. Pre-diabetes A1c was checked over 6 months ago.  Patient is definitely at high risk of diabetes secondary to her diet, weight, and family history.  Patient would be willing to start metformin if indicated. - Hemoglobin A1c Defers nutritional therapy at this time.  6.  Depression/PTSD -Continue Prozac and trazodone as directed.  Referral to behavioral health placed for therapy. Suicide risks evaluated and documented in note if present or in the area below.  Patient has protective factors of family and community support.  Patient reports that family believes is behaving rationally. Patient displays problem solving skills.   Patient specifically denies suicide ideation. Patient has access/information to healthcare contacts if situation or mood changes where patient is a risk to self or others or mood becomes unstable.   During the encounter, the patient had good eye contact and firm handshake regarding safety contract and agreement to seek help if mood worsens and not to harm self.   Patient understands the treatment plan and is in agreement. Agrees to keep follow up and call prior or return to clinic if needed.   Return in about 3 months (around 03/31/2018) for follow up. Aliene Beamsachel Amairani Shuey, MD 12/29/2017

## 2017-12-30 ENCOUNTER — Encounter: Payer: Self-pay | Admitting: Family Medicine

## 2017-12-30 ENCOUNTER — Other Ambulatory Visit: Payer: Self-pay

## 2017-12-30 LAB — BASIC METABOLIC PANEL
BUN / CREAT RATIO: 8 (calc) (ref 6–22)
BUN: 5 mg/dL — AB (ref 7–25)
CALCIUM: 9.5 mg/dL (ref 8.6–10.2)
CO2: 28 mmol/L (ref 20–32)
Chloride: 104 mmol/L (ref 98–110)
Creat: 0.61 mg/dL (ref 0.50–1.10)
GLUCOSE: 111 mg/dL (ref 65–139)
Potassium: 4.2 mmol/L (ref 3.5–5.3)
SODIUM: 141 mmol/L (ref 135–146)

## 2017-12-30 LAB — HEMOGLOBIN A1C
HEMOGLOBIN A1C: 5.7 %{Hb} — AB (ref <5.7)
Mean Plasma Glucose: 117 (calc)
eAG (mmol/L): 6.5 (calc)

## 2017-12-30 LAB — TSH: TSH: 1.41 m[IU]/L

## 2017-12-30 MED ORDER — FLUOXETINE HCL 40 MG PO CAPS: 40.0000 mg | ORAL_CAPSULE | Freq: Every day | ORAL | 3 refills | Status: DC

## 2018-01-01 ENCOUNTER — Encounter: Payer: Self-pay | Admitting: Family Medicine

## 2018-01-19 ENCOUNTER — Telehealth: Payer: Self-pay

## 2018-01-19 NOTE — Telephone Encounter (Signed)
Writer left message on 01-12-2018 and 01-19-2018

## 2018-02-04 ENCOUNTER — Telehealth: Payer: Self-pay

## 2018-02-04 DIAGNOSIS — F339 Major depressive disorder, recurrent, unspecified: Secondary | ICD-10-CM

## 2018-02-04 NOTE — BH Specialist Note (Signed)
[facility]  MRN: [id number] NAME: April Carney Date: 02/04/18   Total time: 1 hour  Type of Contact: Type of Contact: Phone Call Initial Contact Patient consent obtained: Patient consent obtained for Virtual Visit: (NA) Reason for Visit today: Reason for Your Call/Visit Today: [facility]   Treatment History Patient recently received Inpatient Treatment: Have You Recently Been in Any Inpatient Treatment (Hospital/Detox/Crisis Center/28-Day Program)?: No  Facility/Program: Name/Location of Program/Hospital: NA  Date of discharge: When Were You Discharged?: (NA) Patient currently being seen by therapist/psychiatrist: Do You Currently Have a Therapist/Psychiatrist?: No Patient currently receiving the following services: Patient Currently Receiving the Following Services:: Medication Management(  PCP prescribes her medication )    Psychiatric History  Past Psychiatric History/Hospitalization(s): Anxiety: Yes Bipolar Disorder: No Depression: Yes Mania: No Psychosis: No Schizophrenia: No Personality Disorder: No Hospitalization for psychiatric illness: No History of Electroconvulsive Shock Therapy: No Prior Suicide Attempts: No Decreased need for sleep: No  Euphoria: No Self Injurious behaviors No Family History of mental illness: No Family History of substance abuse: No  Substance Abuse: No  DUI: No  Insomnia: No  History of violence No  Physical, sexual or emotional abuse:No  Prior outpatient mental health therapy: Yes - [facility]    Clinical Assessment:  PHQ-9 Assessments: [facility] 02/04/2018 12/29/2017 09/25/2017  Decreased Interest 2 1 0  Down, Depressed, Hopeless 2 0 0  PHQ - 2 Score 4 1 0  Altered sleeping 1 - -  Tired, decreased energy 1 - -  Change in appetite 1 - -  Feeling bad or failure about yourself  3 - -  Trouble concentrating 1 - -  Moving slowly or fidgety/restless 0 - -   Suicidal thoughts 0 - -  PHQ-9 Score 11 - -  Difficult doing work/chores Somewhat difficult - -    GAD-7 Assessments: GAD 7 : Generalized Anxiety Score 02/04/2018  Nervous, Anxious, on Edge 2  Control/stop worrying 3  Worry too much - different things 3  Trouble relaxing 1  Restless 1  Easily annoyed or irritable 1  Afraid - awful might happen 3  Total GAD 7 Score 14     Social Functioning Social maturity: Social Maturity: Responsible Social judgement: Social Judgement: Normal  Stress Current stressors: Current Stressors: ([employer] wants to cancel her son's disability check and services ) Familial stressors: Familial Stressors: None Sleep: Sleep: Decreased, Difficulty staying asleep Appetite: Appetite: Decreased, Loss of appetite Coping ability: Coping ability: Exhausted, Overwhelmed, Deficient support system  Patient taking medications as prescribed: Patient taking medications as prescribed: Yes  Current medications:  Outpatient Encounter Medications as of 02/04/2018  Medication Sig  . acetaminophen (TYLENOL) 325 MG tablet Take 650 mg by mouth every 6 (six) hours as needed.  Marland Kitchen. atorvastatin (LIPITOR) 20 MG tablet Take 1 tablet (20 mg total) by mouth daily.  . cetirizine (ZYRTEC) 10 MG tablet Take 10 mg by mouth daily.  . diphenhydrAMINE (BENADRYL) 25 MG tablet Take 25 mg by mouth every 6 (six) hours as needed.  Marland Kitchen. FLUoxetine (PROZAC) 40 MG capsule Take 1 capsule (40 mg total) by mouth daily.  . hydrochlorothiazide (MICROZIDE) 12.5 MG capsule Take 1 capsule (12.5 mg total) by mouth daily.  Marland Kitchen. ibuprofen (ADVIL,MOTRIN) 600 MG tablet Take 1 tablet (600 mg total) by mouth every 6 (six) hours as needed.  Marland Kitchen. levothyroxine (SYNTHROID, LEVOTHROID) 150 MCG tablet TAKE 1 TABLET BY MOUTH ONCE DAILY  . traZODone (DESYREL) 50  MG tablet Take 50 mg by mouth at bedtime.   No facility-administered encounter medications on file as of 02/17/2018.     Self-harm Behaviors Risk Assessment Self-harm  risk factors: Self-harm risk factors: (NA) Patient endorses recent thoughts of harming self: Have you recently had any thoughts about harming yourself?: No  [place] Suicide Severity Rating Scale:  C-SRSS 02/17/18  1. Wish to be Dead No  2. Suicidal Thoughts No  6. Suicide Behavior Question No    Danger to Others Risk Assessment Danger to others risk factors: Danger to Others Risk Factors: No risk factors noted Patient endorses recent thoughts of harming others: Notification required: No need or identified person   Substance Use Assessment Patient recently consumed alcohol: Have you recently consumed alcohol?: No  Alcohol Use Disorder Identification Test (AUDIT):  Alcohol Use Disorder Test (AUDIT) 02/17/18  1. How often do you have a drink containing alcohol? 0  2. How many drinks containing alcohol do you have on a typical day when you are drinking? 0  3. How often do you have six or more drinks on one occasion? 0  AUDIT-C Score 0  Intervention/Follow-up AUDIT Score <7 follow-up not indicated   Patient recently used drugs: Have you recently used any drugs?: No  Patient is concerned about dependence or abuse of substances: Does patient seem concerned about dependence or abuse of any substance?: No   Goals, Interventions and Follow-up Plan Goals: Increase healthy adjustment to current life circumstances Interventions: Motivational Interviewing, Solution-Focused Strategies, Mindfulness or Management consultant, Behavioral Activation, Brief CBT and Supportive Counseling Follow-up Plan: [facility] Phone Follow Up Appt with [name] on 8/5/219  Summary of Clinical Assessment Summary:    Patient is a 37 year old female.  Patient reports a referral to [facility] due to needing a therapist because, "she can no longer be seen at [facility] b/c missed appointment with therapist. Patient reports that she get her Prozac from them and sees a therapist".   Patient reports that her mood has been  controlled on the Prozac.  She reports that she uses trazodone at bedtime for sleep.  She denies any side effects with the medication.  She is sleeping better than she was at her last visit  Patient reports that she has a history of PTSD due to the trauma her son has endured with her liver transplant. She reports that she does have flashbacks related to her son's illness.   In addition to another son that is ADHD and ODD.  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  Patient reports increased anxiety because [employer] wants to cancel her son's disability check.    Patient reports that she is single and lives her two sons.  Patient reports that she likes to do crafts and read romance novels for fun.   Writer scheduled an appt with [name] on 03/01/2018 at [facility] in [place] with [employer].    Phillip Heal LaVerne, LCAS-A

## 2018-02-10 NOTE — Progress Notes (Signed)
[facility] ([facility]) Psychiatric Consultant Case Review   Summary April Carney is a 37 y.o. year old female with history of depression, PTSD by report, hypertension, hypercholesterolemia, hypothyroidism. [facility] due to frequently missing appointments. Patient states that her mood is relatively well controlled since being on current regimen.    Psychosocial factors: her son with liver transplant, financial strain  Current Medications Current Outpatient Medications on File Prior to Visit  Medication Sig Dispense Refill  . acetaminophen (TYLENOL) 325 MG tablet Take 650 mg by mouth every 6 (six) hours as needed.    Marland Kitchen. atorvastatin (LIPITOR) 20 MG tablet Take 1 tablet (20 mg total) by mouth daily. 90 tablet 3  . cetirizine (ZYRTEC) 10 MG tablet Take 10 mg by mouth daily.    . diphenhydrAMINE (BENADRYL) 25 MG tablet Take 25 mg by mouth every 6 (six) hours as needed.    Marland Kitchen. FLUoxetine (PROZAC) 40 MG capsule Take 1 capsule (40 mg total) by mouth daily. 30 capsule 3  . hydrochlorothiazide (MICROZIDE) 12.5 MG capsule Take 1 capsule (12.5 mg total) by mouth daily. 30 capsule 12  . ibuprofen (ADVIL,MOTRIN) 600 MG tablet Take 1 tablet (600 mg total) by mouth every 6 (six) hours as needed. 30 tablet 0  . levothyroxine (SYNTHROID, LEVOTHROID) 150 MCG tablet TAKE 1 TABLET BY MOUTH ONCE DAILY 90 tablet 0  . traZODone (DESYREL) 50 MG tablet Take 50 mg by mouth at bedtime.     No current facility-administered medications on file prior to visit.      Past psychiatry history [facility]: denies Previous suicide attempt: denies Past trials of medication: denies History of violence: denies  Current measures [facility] screen [facility] 2/9 02/04/2018 12/29/2017 09/25/2017 03/26/2017 07/30/2016  Decreased Interest 2 1 0 0 0  Down, Depressed, Hopeless 2 0 0 0 0  PHQ - 2 Score 4 1 0 0 0  Altered sleeping 1 - - - -  Tired, decreased  energy 1 - - - -  Change in appetite 1 - - - -  Feeling bad or failure about yourself  3 - - - -  Trouble concentrating 1 - - - -  Moving slowly or fidgety/restless 0 - - - -  Suicidal thoughts 0 - - - -  PHQ-9 Score 11 - - - -  Difficult doing work/chores Somewhat difficult - - - -   GAD 7 : Generalized Anxiety Score 02/04/2018  Nervous, Anxious, on Edge 2  Control/stop worrying 3  Worry too much - different things 3  Trouble relaxing 1  Restless 1  Easily annoyed or irritable 1  Afraid - awful might happen 3  Total GAD 7 Score 14    Goals (patient centered) Increase healthy adjustment to current life circumstances   Assessment/Provisional Diagnosis # MDD  # PTSD by history  Would recommend continue current medication.   Recommendation  - Continue fluoxetine 40 mg daily. Consider uptitration to 60 mg daily in the future if any worsening in her mood symptoms. - Continue Trazodone 50 mg qhs - The patient is referred to see a therapist - BH specialist to provide behavioral activation, supportive therapy  Thank you for your consult. We will continue to follow the patient. Please contact vBHI  for any questions or concerns.   The above treatment considerations and suggestions are based on consultation with the [facility] and/or PCP and a review of information available in the shared registry and  the patient's Electronic Health Record (EHR). I have not personally examined the patient. All recommendations should be implemented with consideration of the patient's relevant prior history and current clinical status. Please feel free to call me with any questions about the care of this patient.

## 2018-03-01 ENCOUNTER — Telehealth: Payer: Self-pay

## 2018-03-01 ENCOUNTER — Ambulatory Visit (INDEPENDENT_AMBULATORY_CARE_PROVIDER_SITE_OTHER): Payer: Medicaid Other | Admitting: Psychiatry

## 2018-03-01 ENCOUNTER — Encounter (HOSPITAL_COMMUNITY): Payer: Self-pay | Admitting: Psychiatry

## 2018-03-01 DIAGNOSIS — F331 Major depressive disorder, recurrent, moderate: Secondary | ICD-10-CM
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
## 2018-03-01 NOTE — Telephone Encounter (Signed)
[facility] - Linked with Florencia ReasonsPeggy Carney - appointment completed on 03/01/2018.  Patient will be placed on the inactive list.

## 2018-03-05 ENCOUNTER — Telehealth: Payer: Self-pay

## 2018-03-05 NOTE — Telephone Encounter (Signed)
[facility] - Inactive .  Patient completed appt with Florencia ReasonsPeggy Bynum on 02-26-2018

## 2018-03-31 ENCOUNTER — Ambulatory Visit (INDEPENDENT_AMBULATORY_CARE_PROVIDER_SITE_OTHER): Payer: Medicaid Other | Admitting: Family Medicine

## 2018-03-31 ENCOUNTER — Encounter: Payer: Self-pay | Admitting: Family Medicine

## 2018-03-31 VITALS — BP 132/84 | HR 74 | Resp 14 | Ht 65.0 in | Wt 306.0 lb

## 2018-03-31 DIAGNOSIS — E039 Hypothyroidism, unspecified: Secondary | ICD-10-CM | POA: Diagnosis not present

## 2018-03-31 DIAGNOSIS — I10 Essential (primary) hypertension: Secondary | ICD-10-CM

## 2018-03-31 DIAGNOSIS — R7303 Prediabetes: Secondary | ICD-10-CM

## 2018-03-31 DIAGNOSIS — F339 Major depressive disorder, recurrent, unspecified: Secondary | ICD-10-CM

## 2018-03-31 DIAGNOSIS — E782 Mixed hyperlipidemia: Secondary | ICD-10-CM

## 2018-03-31 MED ORDER — FLUOXETINE HCL 20 MG PO CAPS: ORAL_CAPSULE | ORAL | 0 refills | Status: DC

## 2018-03-31 NOTE — Progress Notes (Signed)
[name], female    DOB: 1981-04-30, 37 y.o.   MRN: [id number]  Chief Complaint  Patient presents with  . Hypertension    follow up visit     Allergies Patient has no known allergies.  Subjective:   April Carney is a 37 y.o. female who presents to [facility] today.  HPI Here for follow up. Has been doing well. Has felt better over the past couple months. Reports that mood has been better over the past couple months but still feels like needs improvement. Does not have interest in doing activities with family. Wants to be more active and engaged in life. No side effects with prozac.  Appetite is good. Has been napping during the day and then does not sleep well at night. Has been enjoying the time now that kids are in school. Has not been doing exercise except some occasional walking. Weight has been stable. Has been taking BP medications.  Reports that blood pressure is running well.  No swelling in lower extremities.  No shortness of breath, chest pain, or palpitations. Taking cholesterol medication each night.  No myalgias.  Consistently taking thyroid medication.  Has not had any changes in her weight.  Has not really lost any weight.  Is trying to work on her appetite control and portion size.  Reports she needs to be more active and tends to like to take a nap during the day.  Reports her energy can be low.  Denies any suicidal or homicidal ideations.  Would be interested in increasing her dose of Prozac.  Reports initially was started on this medication greater than 1 [facility] ago [facility].  Has been seeing therapist at [facility].   Past Medical History:  Diagnosis Date  . Hyperlipidemia   . Hypertension   . Hypoactive thyroid   . Mental disorder    depression and PTSD    Past Surgical History:  Procedure Laterality Date  . CESAREAN SECTION      Family History  Problem Relation Age of Onset  . Diabetes  Mother   . Hypertension Mother   . Hyperlipidemia Mother   . Kidney disease Mother   . Stroke Mother   . Diabetes Father   . Heart disease Father   . Liver disease Son   . Asthma Son   . ADD / ADHD Son   . ODD Son   . Asthma Son   . ADD / ADHD Son   . ODD Son   . Cancer Paternal Aunt      Social History   Socioeconomic History  . Marital status: Single    Spouse name: Not on file  . Number of children: Not on file  . Years of education: Not on file  . Highest education level: Not on file  Occupational History  . Not on file  Social Needs  . Financial resource strain: Not on file  . Food insecurity:    Worry: Not on file    Inability: Not on file  . Transportation needs:    Medical: Not on file    Non-medical: Not on file  Tobacco Use  . Smoking status: Never Smoker  . Smokeless tobacco: Never Used  Substance and Sexual Activity  . Alcohol use: Yes    Comment: occasionally  . Drug use: No  . Sexual activity: Yes    Partners: Male    Birth control/protection: None  Lifestyle  .  Physical activity:    Days per week: Not on file    Minutes per session: Not on file  . Stress: Not on file  Relationships  . Social connections:    Talks on phone: Not on file    Gets together: Not on file    Attends religious service: Not on file    Active member of club or organization: Not on file    Attends meetings of clubs or organizations: Not on file    Relationship status: Not on file  Other Topics Concern  . Not on file  Social History Narrative   Lives in [place], [place]. Dating. Enjoys reading and crafting. Has two children. Does not work outside the home. Children are in school.    Current Outpatient Medications on File Prior to Visit  Medication Sig Dispense Refill  . acetaminophen (TYLENOL) 325 MG tablet Take 650 mg by mouth every 6 (six) hours as needed.    Marland Kitchen atorvastatin (LIPITOR) 20 MG tablet Take 1 tablet (20 mg total) by mouth daily. 90 tablet 3  . cetirizine  (ZYRTEC) 10 MG tablet Take 10 mg by mouth daily.    . diphenhydrAMINE (BENADRYL) 25 MG tablet Take 25 mg by mouth every 6 (six) hours as needed.    . hydrochlorothiazide (MICROZIDE) 12.5 MG capsule Take 1 capsule (12.5 mg total) by mouth daily. 30 capsule 12  . levothyroxine (SYNTHROID, LEVOTHROID) 150 MCG tablet TAKE 1 TABLET BY MOUTH ONCE DAILY 90 tablet 0   No current facility-administered medications on file prior to visit.      Review of Systems   Objective:   BP 132/84   Pulse 74   Resp 14   Ht 5\' 5"  (1.651 m)   Wt (!) 306 lb (138.8 kg)   BMI 50.92 kg/m   Physical Exam  Constitutional: She is oriented to person, place, and time. She appears well-developed and well-nourished.  Eyes: Pupils are equal, round, and reactive to light. EOM are normal.  Neck: Normal range of motion. Neck supple. No JVD present. No tracheal deviation present.  Cardiovascular: Normal rate, regular rhythm and normal heart sounds.  Pulmonary/Chest: Effort normal and breath sounds normal.  Abdominal: Soft. Bowel sounds are normal.  Lymphadenopathy:    She has no cervical adenopathy.  Neurological: She is alert and oriented to person, place, and time. No cranial nerve deficit.  Skin: Skin is warm. No rash noted.  Psychiatric: Her behavior is normal. Judgment and thought content normal. Her affect is blunt.  Mood somewhat blunted.  Affect congruent with mood.  No suicidal or homicidal ideations.  Thought process is goal-directed without delusions phobias, obsessions, or compulsions.  Patient is alert and oriented.  She is somewhat disheveled and has body odor.  Vitals reviewed.  Depression screen [facility] 2/9 03/31/2018 02/04/2018 12/29/2017 09/25/2017 03/26/2017  Decreased Interest 0 2 1 0 0  Down, Depressed, Hopeless 1 2 0 0 0  PHQ - 2 Score 1 4 1  0 0  Altered sleeping 3 1 - - -  Tired, decreased energy 2 1 - - -  Change in appetite 0 1 - - -  Feeling bad or failure about yourself  0 3 - - -  Trouble  concentrating 0 1 - - -  Moving slowly or fidgety/restless 0 0 - - -  Suicidal thoughts 0 0 - - -  PHQ-9 Score 6 11 - - -  Difficult doing work/chores Somewhat difficult Somewhat difficult - - -    Assessment  and Plan  1. Depression, recurrent (HCC) Increase Prozac to 60 mg p.o. Daily. Exercise, sleep hygiene, and participation in family and group activities discussed. Keep continue visits with therapy.  Call with any questions or concerns. Patient counseled in detail regarding the risks of medication. Told to call or return to clinic if develop any worrisome signs or symptoms. Patient voiced understanding.  Suicide risks evaluated and documented in note if present or in the area below. Patient has protective factors of family and community support.  Patient reports that family believes is behaving rationally. Patient displays problem solving skills.   Patient specifically denies suicide ideation. Patient has access/information to healthcare contacts if situation or mood changes where patient is a risk to self or others or mood becomes unstable.   During the encounter, the patient had good eye contact and firm handshake regarding safety contract and agreement to seek help if mood worsens and not to harm self.   Patient understands the treatment plan and is in agreement. Agrees to keep follow up and call prior or return to clinic if needed.   - FLUoxetine (PROZAC) 20 MG capsule; Take three pills each morning as directed.  Dispense: 90 capsule; Refill: 0  2. Acquired hypothyroidism Stable.  Continue current medications.  Patient will be due for lab check in December/2019.  3. Essential hypertension Stable.  Dietary modifications and salt intake discussed. The patient is asked to make an attempt to improve diet and exercise patterns to aid in medical management of this problem.  4. Pre-diabetes Not indicated for medication at this time.  Weight loss recommended to decrease chances of  becoming diabetic. 5. Mixed hyperlipidemia Continue statin medication.  Check LFTs in December 2019.  6. Morbid obesity (HCC) Patient's BMI is greater than 50.  She understands that losing weight would help with her energy level.  Dietary and lifestyle modifications recommended. Follow-up in 2 weeks if she has not yet establish care with new PCP.  She will call with any questions we will get flu shot at her next visit.  Return in about 2 weeks (around 04/14/2018). Aliene Beams, MD 03/31/2018

## 2018-04-01 ENCOUNTER — Encounter: Payer: Self-pay | Admitting: Family Medicine

## 2018-04-14 ENCOUNTER — Encounter: Payer: Self-pay | Admitting: Family Medicine

## 2018-04-14 ENCOUNTER — Ambulatory Visit: Payer: Medicaid Other | Admitting: Family Medicine

## 2018-04-14 ENCOUNTER — Other Ambulatory Visit: Payer: Self-pay

## 2018-04-14 DIAGNOSIS — F339 Major depressive disorder, recurrent, unspecified: Secondary | ICD-10-CM | POA: Diagnosis not present

## 2018-04-14 MED ORDER — FLUOXETINE HCL 20 MG PO CAPS: ORAL_CAPSULE | ORAL | 3 refills | Status: AC

## 2018-04-14 NOTE — Progress Notes (Signed)
[name], female    DOB: 1981-06-01, 37 y.o.   MRN: [id number]  Chief Complaint  Patient presents with  . Depression    follow up    Allergies Patient has no known allergies.  Subjective:   April Carney is a 37 y.o. female who presents to [facility] today.  HPI Here for follow-up on her depression.  At the beginning of September she was increased to Prozac 60 mg a day.  Has been taking the daily medication dose change since that time.  Reports that she has seen an improvement in her mood.  Reports her energy is improved.  Motivation is improved.  She is having more interested in getting out and doing things.  She has been more interactive with her family and friends.  Is considering getting a job.  Has not had any side effects with the medication.  No suicidal or homicidal ideations.  Reports that her concentration is improved.  Will be seeing a therapist starting in October.  Appetite is good.  Is trying to get some exercise.  Is sleeping well at night.  Has not been taking naps during the day.  Overall is very pleased with the improvement in her mood.  Would like a refill of this dose.  Is going to transfer her care to [facility] in [place], [place].  Will be seen there in November.   Past Medical History:  Diagnosis Date  . Hyperlipidemia   . Hypertension   . Hypoactive thyroid   . Mental disorder    depression and PTSD    Past Surgical History:  Procedure Laterality Date  . CESAREAN SECTION      Family History  Problem Relation Age of Onset  . Diabetes Mother   . Hypertension Mother   . Hyperlipidemia Mother   . Kidney disease Mother   . Stroke Mother   . Diabetes Father   . Heart disease Father   . Liver disease Son   . Asthma Son   . ADD / ADHD Son   . ODD Son   . Asthma Son   . ADD / ADHD Son   . ODD Son   . Cancer Paternal Aunt      Social History   Socioeconomic History  . Marital status:  Single    Spouse name: Not on file  . Number of children: Not on file  . Years of education: Not on file  . Highest education level: Not on file  Occupational History  . Not on file  Social Needs  . Financial resource strain: Not on file  . Food insecurity:    Worry: Not on file    Inability: Not on file  . Transportation needs:    Medical: Not on file    Non-medical: Not on file  Tobacco Use  . Smoking status: Never Smoker  . Smokeless tobacco: Never Used  Substance and Sexual Activity  . Alcohol use: Yes    Comment: occasionally  . Drug use: No  . Sexual activity: Yes    Partners: Male    Birth control/protection: None  Lifestyle  . Physical activity:    Days per week: Not on file    Minutes per session: Not on file  . Stress: Not on file  Relationships  . Social connections:    Talks on phone: Not on file    Gets together: Not on file    Attends religious service:  Not on file    Active member of club or organization: Not on file    Attends meetings of clubs or organizations: Not on file    Relationship status: Not on file  Other Topics Concern  . Not on file  Social History Narrative   Lives in [place], [place]. Dating. Enjoys reading and crafting. Has two children. Does not work outside the home. Children are in school.    Current Outpatient Medications on File Prior to Visit  Medication Sig Dispense Refill  . acetaminophen (TYLENOL) 325 MG tablet Take 650 mg by mouth every 6 (six) hours as needed.    Marland Kitchen atorvastatin (LIPITOR) 20 MG tablet Take 1 tablet (20 mg total) by mouth daily. 90 tablet 3  . cetirizine (ZYRTEC) 10 MG tablet Take 10 mg by mouth daily.    . diphenhydrAMINE (BENADRYL) 25 MG tablet Take 25 mg by mouth every 6 (six) hours as needed.    . hydrochlorothiazide (MICROZIDE) 12.5 MG capsule Take 1 capsule (12.5 mg total) by mouth daily. 30 capsule 12  . levothyroxine (SYNTHROID, LEVOTHROID) 150 MCG tablet TAKE 1 TABLET BY MOUTH ONCE DAILY 90 tablet 0    No current facility-administered medications on file prior to visit.     Review of Systems  Constitutional: Negative for activity change, appetite change, chills, fatigue and fever.  HENT: Negative for trouble swallowing.   Eyes: Negative for visual disturbance.  Gastrointestinal: Negative for abdominal pain.  Musculoskeletal: Negative for arthralgias.  Skin: Negative for rash.  Neurological: Negative for dizziness, syncope and light-headedness.  Psychiatric/Behavioral: Negative for agitation, behavioral problems, decreased concentration, dysphoric mood, hallucinations and sleep disturbance. The patient is not nervous/anxious and is not hyperactive.      Objective:   BP 122/84 (BP Location: Left Arm, Patient Position: Sitting, Cuff Size: Large)   Pulse 79   Temp 97.9 F (36.6 C) (Temporal)   Resp 12   Ht 5\' 5"  (1.651 m)   Wt (!) 307 lb (139.3 kg)   SpO2 96% Comment: room air  BMI 51.09 kg/m   Physical Exam  Constitutional: She appears well-developed and well-nourished.  Cardiovascular: Normal rate, regular rhythm and normal heart sounds.  Pulmonary/Chest: Effort normal and breath sounds normal.  Psychiatric: She has a normal mood and affect. Her speech is normal and behavior is normal. Judgment and thought content normal. Her affect is not blunt and not labile. She is not actively hallucinating. Cognition and memory are normal. She does not express impulsivity. She expresses no homicidal and no suicidal ideation. She expresses no homicidal plans. She is attentive.    Depression screen [facility] 2/9 04/14/2018 03/31/2018 02/04/2018 12/29/2017 09/25/2017  Decreased Interest 1 0 2 1 0  Down, Depressed, Hopeless 0 1 2 0 0  PHQ - 2 Score 1 1 4 1  0  Altered sleeping 0 3 1 - -  Tired, decreased energy 1 2 1  - -  Change in appetite 1 0 1 - -  Feeling bad or failure about yourself  0 0 3 - -  Trouble concentrating 0 0 1 - -  Moving slowly or fidgety/restless 0 0 0 - -  Suicidal thoughts 0 0  0 - -  PHQ-9 Score 3 6 11  - -  Difficult doing work/chores Not difficult at all Somewhat difficult Somewhat difficult - -    Assessment and Plan  1. Depression, recurrent (HCC) Improved.  Continue Prozac 20 mg, 3 p.o. daily.  Medication refilled. Suicide risks evaluated and documented in note  if present or in the area below.  Patient has protective factors of family and community support.  Patient reports that family believes is behaving rationally. Patient displays problem solving skills.   Patient specifically denies suicide ideation. Patient has access/information to healthcare contacts if situation or mood changes where patient is a risk to self or others or mood becomes unstable.   During the encounter, the patient had good eye contact and firm handshake regarding safety contract and agreement to seek help if mood worsens and not to harm self.   Patient understands the treatment plan and is in agreement. Agrees to keep follow up and call prior or return to clinic if needed.  Patient has contact numbers if she has any abrupt changes in her mood.  She will establish care with new PCP office within the next 4 to 6 weeks.  Continue all current medications.  Diet, exercise, and weight loss modifications discussed.  No follow-ups on file. Aliene Beams, MD 04/14/2018

## 2018-04-26 ENCOUNTER — Other Ambulatory Visit: Payer: Self-pay | Admitting: Family Medicine

## 2018-04-26 ENCOUNTER — Encounter: Payer: Self-pay | Admitting: Family Medicine

## 2018-04-26 DIAGNOSIS — I10 Essential (primary) hypertension: Secondary | ICD-10-CM

## 2018-04-27 ENCOUNTER — Other Ambulatory Visit: Payer: Self-pay | Admitting: Family Medicine

## 2018-04-27 DIAGNOSIS — I10 Essential (primary) hypertension: Secondary | ICD-10-CM

## 2018-05-07 ENCOUNTER — Ambulatory Visit (INDEPENDENT_AMBULATORY_CARE_PROVIDER_SITE_OTHER): Payer: Medicaid Other | Admitting: Psychiatry

## 2018-05-07 DIAGNOSIS — F331 Major depressive disorder, recurrent, moderate: Secondary | ICD-10-CM

## 2018-05-07 NOTE — Progress Notes (Signed)
   THERAPIST PROGRESS NOTE  Session Time: Friday 05/07/2018 9:08 AM - 10:10 AM   Participation Level: Active  Behavioral Response: CasualAlertAnxious  Type of Therapy: Individual Therapy  Treatment Goals addressed: Elevate mood and show evidence of usual energy, activities, and socialization  Interventions: CBT  Summary: April Carney is a 37 y.o. female who is referred for services through [facility] due to experiencing symptoms of depression. She denies any psychiatric hospitalizatioins. She reports participating in outpatient therapy for two years. She reports discontinuing services at [facility] because her son who was also seen there at one time discontinued services there. She states no longer feeling right going there. She reports last being seen in January 2019. Patient states  " I need to see somebody to help me cope with things and discuss things that are going on in life. I yell a lot and get aggravated quickly. I don't take time to think before I yell. I see now how it affects my kids. I need to find a job. I have stress with my boyfriend because It seems as though he doesn't want to grow up and take more of a role with the children the way I would like.  He works third shift and sleeps during the day."  Patient last was seen in August. She reports improved mood since taking increased dosage of prozac beginning in September 2019 as instructed by PCP. She also reports decreased stressed as her two sons, ages 14 and 107 have resumed attending school and patient now has a break during the day. She also is pleased boyfriend is working first shift and now more involved with the children. He also is helping with discipline and patient states no longer feeling like she is the "bad guy". Patient reports improved sleep pattern with use of Trazadone  and states sleeping 8 hours per night. She has been more active and has been applying for part time jobs. She reports continued adjustment issues and  worrying trying to cope with son's medical condition as he had a liver transplant in 2015. He continues to see medical providers regularly and patient reports worry about his condition although it has been stable for some time now. She reports having memories of son having a GI bleed a couple of summers ago and worries something like this may happen again when son has health complaints.  She reports thoughts of "what if" and says this consumes her thoughts when this happens.   Suicidal/Homicidal: Nowithout intent/plan  Therapist Response: Established rapport, reviewed symptoms, administered PHQ-9 and GAD-7, praised and reinforced patient's increased behavioral activation, discussed stressors, facilitated expression of thoughts and feelings, validated feelings, began to provide psychoeducation on depression and anxiety, oriented patient to CBT, provided patient with handout on anxiety, assigned her to read for next session, discussed the anxiety/stress response, discussed way to trigger relaxation response with use of deep breathing, assigned patient to practice deep breathing 5-10 minutes 2 x per day, provided patient with handout on deep breathing, assigned her to review.  Plan: Return again in 2  weeks.  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

## 2018-05-12 ENCOUNTER — Ambulatory Visit (HOSPITAL_COMMUNITY): Payer: Self-pay | Admitting: Psychiatry

## 2018-05-13 ENCOUNTER — Ambulatory Visit: Payer: Self-pay | Admitting: Women's Health

## 2018-05-25 ENCOUNTER — Ambulatory Visit (HOSPITAL_COMMUNITY): Payer: Self-pay | Admitting: Psychiatry

## 2018-06-08 ENCOUNTER — Ambulatory Visit (HOSPITAL_COMMUNITY): Payer: Self-pay | Admitting: Psychiatry

## 2018-06-14 ENCOUNTER — Ambulatory Visit (HOSPITAL_COMMUNITY): Payer: Medicaid Other | Admitting: Psychiatry

## 2018-06-16 ENCOUNTER — Encounter (HOSPITAL_COMMUNITY): Payer: Self-pay | Admitting: Psychiatry

## 2018-06-16 NOTE — Progress Notes (Signed)
Outpatient Therapist Discharge Summary  April Carney    01-16-1981   Admission Date: 03/01/2018  Discharge Date:  06/16/2018 Reason for Discharge:  Excessive no shows/did not respond to outreach efforts Diagnosis:  Axis I:  MDD   Comments:    [name] Nurse    [name], LCSW 06/16/2018

## 2018-06-17 ENCOUNTER — Encounter (HOSPITAL_COMMUNITY): Payer: Self-pay | Admitting: Psychiatry

## 2018-06-22 ENCOUNTER — Ambulatory Visit (HOSPITAL_COMMUNITY): Payer: Self-pay | Admitting: Psychiatry

## 2018-07-13 ENCOUNTER — Emergency Department (HOSPITAL_COMMUNITY)
Admission: EM | Admit: 2018-07-13 | Discharge: 2018-07-13 | Disposition: A | Discharge status: Home / Self Care | Payer: Medicaid Other | Attending: Emergency Medicine | Admitting: Emergency Medicine

## 2018-07-13 ENCOUNTER — Encounter (HOSPITAL_COMMUNITY): Payer: Self-pay

## 2018-07-13 ENCOUNTER — Emergency Department (HOSPITAL_COMMUNITY): Payer: Medicaid Other

## 2018-07-13 ENCOUNTER — Other Ambulatory Visit: Payer: Self-pay

## 2018-07-13 DIAGNOSIS — J029 Acute pharyngitis, unspecified: Secondary | ICD-10-CM | POA: Diagnosis not present

## 2018-07-13 DIAGNOSIS — E785 Hyperlipidemia, unspecified: Secondary | ICD-10-CM | POA: Diagnosis not present

## 2018-07-13 DIAGNOSIS — I10 Essential (primary) hypertension: Secondary | ICD-10-CM | POA: Insufficient documentation

## 2018-07-13 DIAGNOSIS — J0510 Acute epiglottitis without obstruction: Secondary | ICD-10-CM | POA: Diagnosis not present

## 2018-07-13 DIAGNOSIS — E039 Hypothyroidism, unspecified: Secondary | ICD-10-CM | POA: Diagnosis not present

## 2018-07-13 DIAGNOSIS — Z79899 Other long term (current) drug therapy: Secondary | ICD-10-CM | POA: Insufficient documentation

## 2018-07-13 DIAGNOSIS — K1120 Sialoadenitis, unspecified: Secondary | ICD-10-CM

## 2018-07-13 LAB — GROUP A STREP BY PCR: Group A Strep by PCR: NOT DETECTED

## 2018-07-13 LAB — I-STAT CHEM 8, ED
CHLORIDE: 104 mmol/L (ref 98–111)
Calcium, Ion: 1.13 mmol/L — ABNORMAL LOW (ref 1.15–1.40)
Creatinine, Ser: 0.5 mg/dL (ref 0.44–1.00)
Glucose, Bld: 104 mg/dL — ABNORMAL HIGH (ref 70–99)
HEMATOCRIT: 30 % — AB (ref 36.0–46.0)
Hemoglobin: 10.2 g/dL — ABNORMAL LOW (ref 12.0–15.0)
POTASSIUM: 4.3 mmol/L (ref 3.5–5.1)
SODIUM: 138 mmol/L (ref 135–145)
TCO2: 28 mmol/L (ref 22–32)

## 2018-07-13 MED ORDER — IOHEXOL 300 MG/ML  SOLN
75.0000 mL | Freq: Once | INTRAMUSCULAR | Status: AC | PRN
  Administered 2018-07-13: 75 mL via INTRAVENOUS

## 2018-07-13 MED ORDER — SODIUM CHLORIDE 0.9 % IV SOLN
INTRAVENOUS | Status: DC | PRN
  Administered 2018-07-13: 250 mL via INTRAVENOUS

## 2018-07-13 MED ORDER — PREDNISONE 20 MG PO TABS: ORAL_TABLET | ORAL | 0 refills | Status: DC

## 2018-07-13 MED ORDER — PREDNISONE 50 MG PO TABS
60.0000 mg | ORAL_TABLET | Freq: Once | ORAL | Status: AC
  Administered 2018-07-13: 60 mg via ORAL
  Filled 2018-07-13: qty 1

## 2018-07-13 MED ORDER — SODIUM CHLORIDE 0.9 % IV BOLUS
1000.0000 mL | Freq: Once | INTRAVENOUS | Status: AC
  Administered 2018-07-13: 1000 mL via INTRAVENOUS

## 2018-07-13 MED ORDER — CLINDAMYCIN HCL 300 MG PO CAPS: 300.0000 mg | ORAL_CAPSULE | Freq: Four times a day (QID) | ORAL | 0 refills | Status: DC

## 2018-07-13 MED ORDER — CLINDAMYCIN PHOSPHATE 600 MG/50ML IV SOLN
600.0000 mg | Freq: Once | INTRAVENOUS | Status: AC
  Administered 2018-07-13: 600 mg via INTRAVENOUS
  Filled 2018-07-13: qty 50

## 2018-07-13 NOTE — ED Notes (Signed)
Patient transported to CT 

## 2018-07-13 NOTE — Discharge Instructions (Addendum)
Your CT scan shows infection/inflammation of your neck glands.  This has some swelling into your throat.  If the swelling were to get worse, you could develop trouble breathing, swallowing, or speaking.  If you notice any new or worsening symptoms such as trouble breathing, swallowing, speaking, if you get a fever, worsening pain, have vomiting, or any other new/concerning symptoms then you should return to the ER for immediate evaluation.  Otherwise follow-up with ear nose throat in the next 1-3 days.  Call today for an appointment.  Make sure to take the antibiotics as prescribed and start the steroid prescription tomorrow as you were given your first dose today in the ER.

## 2018-07-13 NOTE — ED Provider Notes (Signed)
[facility] EMERGENCY DEPARTMENT Provider Note   CSN: [id number] Arrival date & time: 07/13/18  [id number]     History   Chief Complaint Chief Complaint  Patient presents with  . Sore Throat    HPI April Carney is a 37 y.o. female.  HPI  37 year old female presents with sore throat.  She states that originally started 6 days ago but over the last several days has worsened.  She has not had a fever.  It is bilateral but also feels like her neck hurts sometimes.  However there is no stiffness in her neck.  She is tried multiple cold remedies without relief.  No vomiting, shortness of breath, cough or runny nose.  No ear pain or dental pain.  She rates the pain as a 9 currently.  There is some pain with swallowing but no other difficulty swallowing.  She does not feel like there is a voice change. Recently went on a school trip and thinks she might have gotten sick from that.  Past Medical History:  Diagnosis Date  . Hyperlipidemia   . Hypertension   . Hypoactive thyroid   . Mental disorder    depression and PTSD    Patient Active Problem List   Diagnosis Date Noted  . Elevated liver function tests 06/02/2017  . Well woman exam with routine gynecological exam 07/01/2016  . Superficial fungus infection of skin 07/01/2016  . Essential hypertension 07/01/2016  . Pre-diabetes 12/12/2015  . Hyperlipidemia 12/12/2015  . Situational depression 12/11/2015  . Family h/o Biliary atresia 04/24/2015  . History of preterm delivery 04/24/2015  . History of cesarean section 04/24/2015  . Obesity 04/24/2015  . Hypothyroidism 04/24/2015    Past Surgical History:  Procedure Laterality Date  . CESAREAN SECTION       OB History    Gravida  2   Para  2   Term  1   Preterm  1   AB      Living  2     SAB      TAB      Ectopic      Multiple      Live Births  2            Home Medications    Prior to Admission medications   Medication Sig Start Date End Date  Taking? Authorizing Provider  acetaminophen (TYLENOL) 325 MG tablet Take 650 mg by mouth every 6 (six) hours as needed.    [provider]  atorvastatin (LIPITOR) 20 MG tablet Take 1 tablet (20 mg total) by mouth daily. 05/01/17   Aliene BeamsHagler, Rachel, MD  cetirizine (ZYRTEC) 10 MG tablet Take 10 mg by mouth daily.    [provider]  clindamycin (CLEOCIN) 300 MG capsule Take 1 capsule (300 mg total) by mouth 4 (four) times daily. X 7 days 07/13/18   Pricilla LovelessGoldston, Phyliss Hulick, MD  diphenhydrAMINE (BENADRYL) 25 MG tablet Take 25 mg by mouth every 6 (six) hours as needed.    [provider]  FLUoxetine (PROZAC) 20 MG capsule Take three pills each morning as directed. 04/14/18   Aliene BeamsHagler, Rachel, MD  hydrochlorothiazide (MICROZIDE) 12.5 MG capsule TAKE 1 CAPSULE BY MOUTH ONCE DAILY 04/28/18   Aliene BeamsHagler, Rachel, MD  levothyroxine (SYNTHROID, LEVOTHROID) 150 MCG tablet TAKE 1 TABLET BY MOUTH ONCE DAILY 11/09/17   Aliene BeamsHagler, Rachel, MD  predniSONE (DELTASONE) 20 MG tablet 2 tabs po daily x 4 days 07/14/18   Pricilla LovelessGoldston, Anjuli Gemmill, MD  traZODone (DESYREL) 100  MG tablet Take 100 mg by mouth at bedtime.    [provider]    Family History Family History  Problem Relation Age of Onset  . Diabetes Mother   . Hypertension Mother   . Hyperlipidemia Mother   . Kidney disease Mother   . Stroke Mother   . Diabetes Father   . Heart disease Father   . Liver disease Son   . Asthma Son   . ADD / ADHD Son   . ODD Son   . Asthma Son   . ADD / ADHD Son   . ODD Son   . Cancer Paternal Aunt     Social History Social History   Tobacco Use  . Smoking status: Never Smoker  . Smokeless tobacco: Never Used  Substance Use Topics  . Alcohol use: Yes    Comment: occasionally  . Drug use: No     Allergies   Patient has no known allergies.   Review of Systems Review of Systems  Constitutional: Negative for fever.  HENT: Positive for sore throat. Negative for ear pain, trouble swallowing and voice  change.   Respiratory: Negative for cough and shortness of breath.   Gastrointestinal: Negative for vomiting.     Physical Exam Updated Vital Signs BP (!) 142/87 (BP Location: Left Arm)   Pulse 79   Temp 98.1 F (36.7 C) (Oral)   Resp 18   Ht 5\' 5"  (1.651 m)   Wt 136.1 kg   LMP 05/13/2018 (Approximate) Comment: irregular  SpO2 100%   BMI 49.92 kg/m   Physical Exam Vitals signs and nursing note reviewed.  Constitutional:      General: She is not in acute distress.    Appearance: She is well-developed. She is obese. She is not ill-appearing, toxic-appearing or diaphoretic.  HENT:     Head: Normocephalic and atraumatic.     Right Ear: External ear normal.     Left Ear: External ear normal.     Nose: Nose normal.     Mouth/Throat:     Pharynx: Uvula midline. Posterior oropharyngeal erythema (mild) present. No oropharyngeal exudate or uvula swelling.  Eyes:     General:        Right eye: No discharge.        Left eye: No discharge.  Neck:     Musculoskeletal: Normal range of motion and neck supple.     Comments: No significant tenderness or obvious fluctuance/induration to anterior neck Cardiovascular:     Rate and Rhythm: Normal rate and regular rhythm.     Heart sounds: Normal heart sounds.  Pulmonary:     Effort: Pulmonary effort is normal.     Breath sounds: Normal breath sounds. No wheezing or rales.  Skin:    General: Skin is warm and dry.  Neurological:     Mental Status: She is alert.  Psychiatric:        Mood and Affect: Mood is not anxious.      ED Treatments / Results  Labs (all labs ordered are listed, but only abnormal results are displayed) Labs Reviewed  I-STAT CHEM 8, ED - Abnormal; Notable for the following components:      Result Value   BUN <3 (*)    Glucose, Bld 104 (*)    Calcium, Ion 1.13 (*)    Hemoglobin 10.2 (*)    HCT 30.0 (*)    All other components within normal limits  GROUP A STREP BY PCR  EKG None  Radiology Dg Neck  Soft Tissue  Result Date: 07/13/2018 CLINICAL DATA:  Sore throat with neck pain. EXAM: NECK SOFT TISSUES - 1+ VIEW COMPARISON:  No prior. FINDINGS: Mild prominence of the epiglottis noted. Clinical correlation to exclude epiglottitis suggested. Lower portion of C7 incompletely imaged. No acute bony abnormality identified. No evidence of fracture or dislocation. Pulmonary apices are clear. IMPRESSION: Mild prominence of the epiglottis noted. Clinical correlation to exclude epiglottitis suggested. This result was phoned to the emergency room physician by me at the time of dictation. Electronically Signed   By: Maisie Fus  Register   On: 07/13/2018 08:46   Ct Soft Tissue Neck W Contrast  Result Date: 07/13/2018 CLINICAL DATA:  37 year old female with sore throat for 1 week. Throat swelling. EXAM: CT NECK WITH CONTRAST TECHNIQUE: Multidetector CT imaging of the neck was performed using the standard protocol following the bolus administration of intravenous contrast. CONTRAST:  75mL OMNIPAQUE IOHEXOL 300 MG/ML  SOLN COMPARISON:  Neck radiographs 0800 hours today. FINDINGS: Pharynx and larynx: Mild motion artifact. The level of the glottis is normal. There is mild generalized pharyngeal mucosal space thickening suspected in the hypopharynx and the epiglottis is mildly thickened up to 6 millimeters (series 2, image 46 and sagittal image 43. No discrete tonsillar hyperenhancement. The nasopharynx is normal. The superior parapharyngeal spaces are normal. The retropharyngeal space is normal. Salivary glands: There is inflammatory stranding in the left submandibular space (series 2, image 50) and possibly to a lesser extent in the right submandibular space. There is associated bilateral submental subcutaneous fat stranding and mild thickening of the platysma overlying the submandibular spaces as seen on coronal image 32, series 2, image 68. No superimposed submandibular or sublingual sialolithiasis. Sublingual space is  within normal limits. Both parotid glands are within normal limits. Thyroid: Negative. Lymph nodes: Upper limits of normal to mildly enlarged bilateral level 2 lymph nodes measuring 10-11 millimeters in short axis. No cystic or necrotic nodes. Level 1 nodes remain normal. Other bilateral cervical lymph node stations are within normal limits. Vascular: Suboptimal intravascular contrast bolus but the major vascular structures in the neck and at the skull base appear to be patent. Limited intracranial: Negative. Visualized orbits: Negative. Mastoids and visualized paranasal sinuses: Clear aside from a tiny mucous retention cyst in the posterior left maxillary sinus. Tympanic cavities and mastoids are clear. Skeleton: No acute dental finding. Mandible intact. Negative cervical spine. No acute osseous abnormality identified. Upper chest: Negative visible superior mediastinum. Mild mosaic attenuation in the upper lungs probably due to gas trapping. Other: Large body habitus. IMPRESSION: 1. There is mild thickening of the epiglottis which appears to be in association with generalized hypopharynx mucosal thickening. This suggests an Acute URI with mild epiglottitis component. 2. Furthermore there is left submandibular gland inflammation and left greater than right submandibular space and subcutaneous submental inflammation. No sialolithiasis. This suggests Acute Sialadenitis, which is probably infectious and associated with #1. Possible associated mild submental Cellulitis. 3. Reactive appearing level 2 lymph nodes. 4. No abscess or other complicating features. Electronically Signed   By: Odessa Fleming M.D.   On: 07/13/2018 10:05    Procedures Procedures (including critical care time)  Medications Ordered in ED Medications  sodium chloride 0.9 % bolus 1,000 mL (0 mLs Intravenous Stopped 07/13/18 1016)  iohexol (OMNIPAQUE) 300 MG/ML solution 75 mL (75 mLs Intravenous Contrast Given 07/13/18 0945)  clindamycin (CLEOCIN)  IVPB 600 mg ( Intravenous Stopped 07/13/18 1204)  predniSONE (DELTASONE) tablet 60 mg (  60 mg Oral Given 07/13/18 1125)     Initial Impression / Assessment and Plan / ED Course  I have reviewed the triage vital signs and the nursing notes.  Pertinent labs & imaging results that were available during my care of the patient were reviewed by me and considered in my medical decision making (see chart for details).     Patient is overall well-appearing.  Given her complaints of anterior neck pain and some pain with range of motion, a lateral neck x-ray was obtained and is nonspecific around her epiglottis.  CT obtained and has the above findings.  She continues to do well with no drooling or airway signs or symptoms.  I discussed with Dr. Jearld Fenton of ENT and he recommends that given her well appearance and subacute presentation it is unlikely this is serious or would need IV antibiotics or admission.  He recommends treatment with steroids and clindamycin and can follow-up in his office in a couple days.  However I did stress the importance of returning if any symptoms were to worsen or not improve and she verbalized understanding.  Final Clinical Impressions(s) / ED Diagnoses   Final diagnoses:  Sialadenitis    ED Discharge Orders         Ordered    predniSONE (DELTASONE) 20 MG tablet     07/13/18 1126    clindamycin (CLEOCIN) 300 MG capsule  4 times daily     07/13/18 1126           Pricilla Loveless, MD 07/13/18 1539

## 2018-07-13 NOTE — ED Triage Notes (Signed)
Pt reports sore throat x 1 week.  Unknown if has had fever.   Pt says now feels like throat is swollen.  Reports symptoms started while on a field trip with school kids last week.

## 2018-07-15 DIAGNOSIS — K1120 Sialoadenitis, unspecified: Secondary | ICD-10-CM | POA: Diagnosis not present

## 2019-05-02 ENCOUNTER — Encounter: Payer: Self-pay | Admitting: Women's Health

## 2019-05-02 ENCOUNTER — Other Ambulatory Visit: Payer: Self-pay

## 2019-05-02 ENCOUNTER — Other Ambulatory Visit (HOSPITAL_COMMUNITY)
Admission: RE | Admit: 2019-05-02 | Discharge: 2019-05-02 | Disposition: A | Discharge status: Home / Self Care | Payer: Medicaid Other | Source: Ambulatory Visit | Attending: Obstetrics & Gynecology | Admitting: Obstetrics & Gynecology

## 2019-05-02 ENCOUNTER — Ambulatory Visit: Payer: Medicaid Other | Admitting: Women's Health

## 2019-05-02 VITALS — BP 132/92 | HR 85 | Ht 64.0 in | Wt 303.4 lb

## 2019-05-02 DIAGNOSIS — R7303 Prediabetes: Secondary | ICD-10-CM

## 2019-05-02 DIAGNOSIS — E039 Hypothyroidism, unspecified: Secondary | ICD-10-CM | POA: Insufficient documentation

## 2019-05-02 DIAGNOSIS — I10 Essential (primary) hypertension: Secondary | ICD-10-CM | POA: Diagnosis not present

## 2019-05-02 DIAGNOSIS — B373 Candidiasis of vulva and vagina: Secondary | ICD-10-CM | POA: Diagnosis not present

## 2019-05-02 DIAGNOSIS — B3731 Acute candidiasis of vulva and vagina: Secondary | ICD-10-CM

## 2019-05-02 DIAGNOSIS — N921 Excessive and frequent menstruation with irregular cycle: Secondary | ICD-10-CM | POA: Insufficient documentation

## 2019-05-02 MED ORDER — HYDROCHLOROTHIAZIDE 12.5 MG PO CAPS: 12.5000 mg | ORAL_CAPSULE | Freq: Every day | ORAL | 2 refills | Status: DC

## 2019-05-02 MED ORDER — FLUCONAZOLE 100 MG PO TABS: 100.0000 mg | ORAL_TABLET | Freq: Every day | ORAL | 0 refills | Status: DC

## 2019-05-02 NOTE — Progress Notes (Signed)
   GYN VISIT Patient name: April Carney MRN [id number]  Date of birth: October 05, 1980 Chief Complaint:   Menstrual Problem  History of Present Illness:   [name] is a 38 y.o. G7P1102 Caucasian female being seen today for irregular periods and yeast infection.  No period x 1 year. Started end of July, irregular, q 2-3wks, lasts 1-2wks, heaviest days changes pad q 1hr, large clots, mild to no cramping. Not on any contraception. Not sexually active.  Bad yeast infection for about a week, hasn't used anything Hasn't had bp meds in about a year, her PCP left, has appt w/ new PCP at [facility] at end of Oct.   Patient's last menstrual period was 04/27/2019 (exact date). The current method of family planning is abstinence.  Last pap 04/24/15. Results were:  normal Review of Systems:   Pertinent items are noted in HPI Denies fever/chills, dizziness, headaches, visual disturbances, fatigue, shortness of breath, chest pain, abdominal pain, vomiting, abnormal vaginal discharge/itching/odor/irritation, problems with periods, bowel movements, urination, or intercourse unless otherwise stated above.  Pertinent History Reviewed:  Reviewed past medical,surgical, social, obstetrical and family history.  Reviewed problem list, medications and allergies. Physical Assessment:   Vitals:   05/02/19 0910  BP: (!) 132/92  Pulse: 85  Weight: (!) 303 lb 6.4 oz (137.6 kg)  Height: 5\' 4"  (1.626 m)  Body mass index is 52.08 kg/m.       Physical Examination:   General appearance: alert, well appearing, and in no distress  Mental status: alert, oriented to person, place, and time  Skin: warm & dry   Cardiovascular: normal heart rate noted  Respiratory: normal respiratory effort, no distress  Abdomen: soft, non-tender   Pelvic: VULVA: normal appearing vulva with no masses, tenderness or lesions, erythematous, excoriated, bleeding fissure in skin base of Lt labia majora. Painted all w/ gentian violet  Vagina: normal appearance, menstrual bleeding, Cx: bulbous, normal, no lesions  Extremities: no edema   No results found for this or any previous visit (from the past 24 hour(s)).  Assessment & Plan:  1) Menorrhagia w/ irregular cycle> check TSH, CBC, will get pelvic u/s  2) Severe vulvovaginal candida> painted w/ gentian violet, rx diflucan 100mg  po daily x 10d, check A1C  3) Hypothyroidism> check TSH  4) CHTN> off meds x 28yr, refilled hctz, keep appt w/ new PCP at end of month  5) Past due for pap> schedule today  Meds:  Meds ordered this encounter  Medications  . hydrochlorothiazide (MICROZIDE) 12.5 MG capsule    Sig: Take 1 capsule (12.5 mg total) by mouth daily.    Dispense:  30 capsule    Refill:  2    Order Specific Question:   Supervising Provider    Answer:   Elonda Husky, LUTHER H [[id number]]  . fluconazole (DIFLUCAN) 100 MG tablet    Sig: Take 1 tablet (100 mg total) by mouth daily. x10days    Dispense:  10 tablet    Refill:  0    Order Specific Question:   Supervising Provider    Answer:   Florian Buff [[id number]]    Orders Placed This Encounter  Procedures  . US PELVIS (TRANSABDOMINAL ONLY)  . US PELVIS TRANSVAGINAL NON-OB (TV ONLY)  . CBC  . Hemoglobin A1c  . TSH    Return in about 1 week (around 05/09/2019) for US:GYN, 1st available, Pap & physical.  [place], [facility] 05/02/2019 1497

## 2019-05-03 ENCOUNTER — Other Ambulatory Visit: Payer: Self-pay | Admitting: Women's Health

## 2019-05-03 DIAGNOSIS — E039 Hypothyroidism, unspecified: Secondary | ICD-10-CM

## 2019-05-03 DIAGNOSIS — E119 Type 2 diabetes mellitus without complications: Secondary | ICD-10-CM

## 2019-05-03 LAB — TSH: TSH: 17 u[IU]/mL — ABNORMAL HIGH (ref 0.450–4.500)

## 2019-05-03 LAB — CBC
Hematocrit: 35.5 % (ref 34.0–46.6)
Hemoglobin: 10.4 g/dL — ABNORMAL LOW (ref 11.1–15.9)
MCH: 24.2 pg — ABNORMAL LOW (ref 26.6–33.0)
MCHC: 29.3 g/dL — ABNORMAL LOW (ref 31.5–35.7)
MCV: 83 fL (ref 79–97)
Platelets: 403 10*3/uL (ref 150–450)
RBC: 4.3 x10E6/uL (ref 3.77–5.28)
RDW: 12.7 % (ref 11.7–15.4)
WBC: 6.2 10*3/uL (ref 3.4–10.8)

## 2019-05-03 LAB — HEMOGLOBIN A1C
Est. average glucose Bld gHb Est-mCnc: 349 mg/dL
Hgb A1c MFr Bld: 13.8 % — ABNORMAL HIGH (ref 4.8–5.6)

## 2019-05-03 MED ORDER — METFORMIN HCL 500 MG PO TABS: 500.0000 mg | ORAL_TABLET | Freq: Two times a day (BID) | ORAL | 1 refills | Status: DC

## 2019-05-03 MED ORDER — LEVOTHYROXINE SODIUM 150 MCG PO TABS: 150.0000 ug | ORAL_TABLET | Freq: Every day | ORAL | 1 refills | Status: DC

## 2019-05-09 ENCOUNTER — Other Ambulatory Visit: Payer: Self-pay

## 2019-05-09 ENCOUNTER — Ambulatory Visit: Payer: Medicaid Other

## 2019-05-09 DIAGNOSIS — N921 Excessive and frequent menstruation with irregular cycle: Secondary | ICD-10-CM

## 2019-05-09 LAB — CERVICOVAGINAL ANCILLARY ONLY
Bacterial Vaginitis (gardnerella): POSITIVE — AB
Candida Glabrata: NEGATIVE
Candida Vaginitis: POSITIVE — AB
Chlamydia: NEGATIVE
Neisseria Gonorrhea: NEGATIVE
Trichomonas: NEGATIVE

## 2019-05-09 NOTE — Progress Notes (Addendum)
PELVIC US TA/TV:homogeneous retroverted uterus,wnl,EEC 7.1 mm,normal right ovary,simple left ovarian cyst 2.7 x 1.3 x 1.4 cm,ovaries appear mobile,no free fluid,no pain during ultrasound  Chaperone: Estill Bamberg

## 2019-05-24 ENCOUNTER — Ambulatory Visit (INDEPENDENT_AMBULATORY_CARE_PROVIDER_SITE_OTHER): Payer: Medicaid Other | Admitting: Family Medicine

## 2019-05-24 ENCOUNTER — Encounter: Payer: Self-pay | Admitting: Family Medicine

## 2019-05-24 ENCOUNTER — Other Ambulatory Visit: Payer: Self-pay

## 2019-05-24 DIAGNOSIS — I10 Essential (primary) hypertension: Secondary | ICD-10-CM

## 2019-05-24 DIAGNOSIS — E039 Hypothyroidism, unspecified: Secondary | ICD-10-CM | POA: Diagnosis not present

## 2019-05-24 DIAGNOSIS — E785 Hyperlipidemia, unspecified: Secondary | ICD-10-CM

## 2019-05-24 DIAGNOSIS — Z23 Encounter for immunization: Secondary | ICD-10-CM

## 2019-05-24 DIAGNOSIS — E0800 Diabetes mellitus due to underlying condition with hyperosmolarity without nonketotic hyperglycemic-hyperosmolar coma (NKHHC): Secondary | ICD-10-CM | POA: Diagnosis not present

## 2019-05-24 NOTE — Progress Notes (Signed)
New Patient Office Visit  Subjective:  [name], female    DOB: 01-Feb-1981  Age: 38 y.o. MRN: [id number]  CC:  Chief Complaint  Patient presents with  . Establish Care  . Hypothyroidism  . Diabetes  . Depression    HPI April Carney presents for depression-takes Prozac HTN-HCTZ-takes daily over last 3 weeks, off for 1 year due to provider leaving Hyperlipidemia-lipitor-takes daily Hypothyroid-synthroid-was off x 1 year-restarted 3 weeks ago at GYN office-TSH 17-restarted at previous dose Insomnia-trazodone DM-metformin-restarted-pt off all meds x 1 year-A1c 13.8, previously 5.7%-pt is not checking glucose readings  Past Medical History:  Diagnosis Date  . Depression   . Diabetes mellitus without complication (HCC)   . Hyperlipidemia   . Hypertension   . Hypoactive thyroid   . Hypothyroid   . Mental disorder    depression and PTSD    Past Surgical History:  Procedure Laterality Date  . CESAREAN SECTION      Family History  Problem Relation Age of Onset  . Diabetes Mother   . Hypertension Mother   . Hyperlipidemia Mother   . Kidney disease Mother   . Stroke Mother   . Diabetes Father   . Heart disease Father   . Liver disease Son   . Asthma Son   . ADD / ADHD Son   . ODD Son   . Asthma Son   . ADD / ADHD Son   . ODD Son   . Cancer Paternal Aunt     Social History   Socioeconomic History  . Marital status: Significant Other    Spouse name: Not on file  . Number of children: Not on file  . Years of education: Not on file  . Highest education level: Not on file  Occupational History  . Not on file  Social Needs  . Financial resource strain: Not on file  . Food insecurity    Worry: Not on file    Inability: Not on file  . Transportation needs    Medical: Not on file    Non-medical: Not on file  Tobacco Use  . Smoking status: Never Smoker  . Smokeless tobacco: Never Used  Substance and Sexual Activity  . Alcohol use:  Yes    Comment: occasionally  . Drug use: No  . Sexual activity: Yes    Partners: Male    Birth control/protection: None  Lifestyle  . Physical activity    Days per week: Not on file    Minutes per session: Not on file  . Stress: Not on file  Relationships  . Social Musician on phone: Not on file    Gets together: Not on file    Attends religious service: Not on file    Active member of club or organization: Not on file    Attends meetings of clubs or organizations: Not on file    Relationship status: Not on file  . Intimate partner violence    Fear of current or ex partner: Not on file    Emotionally abused: Not on file    Physically abused: Not on file    Forced sexual activity: Not on file  Other Topics Concern  . Not on file  Social History Narrative   Lives in [place], [place]. Dating. Enjoys reading and crafting. Has two children. Does not work outside the home. Children are in school.     ROS Review of Systems  Constitutional: Positive for  fatigue.  HENT: Negative.   Eyes:       Glasses  Respiratory: Negative.   Cardiovascular: Negative.   Gastrointestinal: Negative.   Endocrine: Negative.   Musculoskeletal: Positive for myalgias.  Skin: Negative.   Allergic/Immunologic: Positive for environmental allergies.  Neurological: Negative.   Hematological: Negative.   Psychiatric/Behavioral: Positive for sleep disturbance.    Objective:   Today's Vitals: LMP 04/27/2019 (Exact Date)   Physical Exam Constitutional:      Appearance: Normal appearance. She is normal weight.  HENT:     Head: Normocephalic and atraumatic.     Right Ear: Tympanic membrane and ear canal normal.     Left Ear: Tympanic membrane and ear canal normal.     Nose: Nose normal.  Eyes:     Conjunctiva/sclera: Conjunctivae normal.  Neck:     Musculoskeletal: Normal range of motion and neck supple.  Cardiovascular:     Rate and Rhythm: Normal rate and regular rhythm.      Pulses: Normal pulses.     Heart sounds: Normal heart sounds.  Pulmonary:     Effort: Pulmonary effort is normal.     Breath sounds: Normal breath sounds.  Neurological:     Mental Status: She is alert and oriented to person, place, and time.  Psychiatric:        Mood and Affect: Mood normal.        Behavior: Behavior normal.     Assessment & Plan:  1. Essential hypertension HCTZ daily-needs renal function - COMPLETE METABOLIC PANEL WITH GFR - POCT URINALYSIS DIP (CLINITEK)  2. Hyperlipidemia, unspecified hyperlipidemia type lipitor daily-needs baseline lipid panel - Lipid panel  3. Hypothyroidism, unspecified type Recheck in 3 weeks-6 week f/u on levels - TSH  4. Diabetes mellitus due to underlying condition with hyperosmolarity without coma, unspecified whether long term insulin use ([place]) Metformin-pt education given on diet modification - POCT UA - Microalbumin  5. Need for immunization against influenza  - Flu Vaccine QUAD 36+ mos IM  Outpatient Encounter Medications as of 05/24/2019  Medication Sig  . acetaminophen (TYLENOL) 325 MG tablet Take 650 mg by mouth every 6 (six) hours as needed.  Marland Kitchen atorvastatin (LIPITOR) 20 MG tablet Take 1 tablet (20 mg total) by mouth daily. (Patient not taking: Reported on 05/02/2019)  . cetirizine (ZYRTEC) 10 MG tablet Take 10 mg by mouth daily.  . diphenhydrAMINE (BENADRYL) 25 MG tablet Take 25 mg by mouth every 6 (six) hours as needed.  . fluconazole (DIFLUCAN) 100 MG tablet Take 1 tablet (100 mg total) by mouth daily. x10days  . FLUoxetine (PROZAC) 20 MG capsule Take three pills each morning as directed.  . hydrochlorothiazide (MICROZIDE) 12.5 MG capsule Take 1 capsule (12.5 mg total) by mouth daily.  Marland Kitchen levothyroxine (SYNTHROID) 150 MCG tablet Take 1 tablet (150 mcg total) by mouth daily.  . metFORMIN (GLUCOPHAGE) 500 MG tablet Take 1 tablet (500 mg total) by mouth 2 (two) times daily with a meal.  . traZODone (DESYREL) 100 MG  tablet Take 100 mg by mouth at bedtime.   No facility-administered encounter medications on file as of 05/24/2019.

## 2019-05-24 NOTE — Patient Instructions (Addendum)
Call for monitor to check glucose  Recheck blood work in 3 weeks-[facility]

## 2019-05-25 LAB — POCT URINALYSIS DIP (CLINITEK)
Bilirubin, UA: NEGATIVE
Blood, UA: NEGATIVE
Glucose, UA: NEGATIVE mg/dL
Ketones, POC UA: NEGATIVE mg/dL
Nitrite, UA: NEGATIVE
Spec Grav, UA: >=1.03 — AB (ref 1.010–1.025)
Urobilinogen, UA: 0.2 E.U./dL
pH, UA: 5.5 (ref 5.0–8.0)

## 2019-05-25 NOTE — Addendum Note (Signed)
Addended by: Saintclair Halsted A on: 05/25/2019 10:42 AM   Modules accepted: Orders

## 2019-05-30 ENCOUNTER — Encounter: Payer: Self-pay | Admitting: Family Medicine

## 2019-05-30 ENCOUNTER — Telehealth: Payer: Self-pay

## 2019-05-30 ENCOUNTER — Telehealth: Payer: Self-pay | Admitting: Family Medicine

## 2019-05-30 DIAGNOSIS — E0800 Diabetes mellitus due to underlying condition with hyperosmolarity without nonketotic hyperglycemic-hyperosmolar coma (NKHHC): Secondary | ICD-10-CM

## 2019-05-30 MED ORDER — BLOOD GLUCOSE MONITOR KIT: PACK | 0 refills | Status: DC

## 2019-05-30 NOTE — Telephone Encounter (Signed)
Patient is calling and states she is at [facility] and they have not received blood glucose meter kit and supplies KIT that was sent today.

## 2019-05-30 NOTE — Telephone Encounter (Signed)
LeighAnn Rickeya Manus, CMA  

## 2019-05-30 NOTE — Telephone Encounter (Signed)
I faxed it in to [facility]

## 2019-05-31 ENCOUNTER — Telehealth: Payer: Self-pay

## 2019-05-31 DIAGNOSIS — E0800 Diabetes mellitus due to underlying condition with hyperosmolarity without nonketotic hyperglycemic-hyperosmolar coma (NKHHC): Secondary | ICD-10-CM

## 2019-05-31 MED ORDER — BLOOD GLUCOSE MONITOR KIT: PACK | 0 refills | Status: AC

## 2019-05-31 MED ORDER — BLOOD GLUCOSE MONITOR KIT: PACK | 0 refills | Status: DC

## 2019-05-31 NOTE — Telephone Encounter (Signed)
April Carney, CMA  

## 2019-05-31 NOTE — Telephone Encounter (Signed)
LeighAnn Starlet Gallentine, CMA  

## 2019-05-31 NOTE — Telephone Encounter (Signed)
[facility] called and said they did not receive this fax.

## 2019-05-31 NOTE — Telephone Encounter (Signed)
[facility] states they now have the Rx

## 2019-06-07 ENCOUNTER — Telehealth: Payer: Self-pay | Admitting: Nutrition

## 2019-06-07 ENCOUNTER — Ambulatory Visit: Payer: Medicaid Other | Admitting: Nutrition

## 2019-06-07 NOTE — Telephone Encounter (Signed)
Pt rescheduled her visit due to kids being at school at home. Reviewed meal times, target goals and avoiding snacks. Emailed DM handouts til visit with me. BS range 122-202 for the last week.  BS doing much better.  Rescheduled her appt for visit in December 2020.

## 2019-06-27 ENCOUNTER — Other Ambulatory Visit: Payer: Medicaid Other | Admitting: Women's Health

## 2019-06-27 ENCOUNTER — Ambulatory Visit: Payer: Medicaid Other | Admitting: Family Medicine

## 2019-06-29 ENCOUNTER — Other Ambulatory Visit: Payer: Self-pay | Admitting: Family Medicine

## 2019-06-29 NOTE — Telephone Encounter (Signed)
Requested medication (s) are due for refill today: yes  Requested medication (s) are on the active medication list:yes   Future visit scheduled: yes  Notes to clinic:  Review for refill   Requested Prescriptions  Pending Prescriptions Disp Refills   Accu-Chek FastClix Lancets [place] [Pharmacy Med Name: FASTCLIX LANCETS    MIS] 102 each 0    Sig: USE 1  TO CHECK GLUCOSE UP TO 4 TIMES DAILY     There is no refill protocol information for this order     ACCU-CHEK GUIDE test strip [Pharmacy Med Name: Accu-Chek Guide In Vitro Strip] 100 each 0    Sig: USE 1 STRIP TO CHECK GLUCOSE UP TO 4 TIMES DAILY AS DIRECTED     There is no refill protocol information for this order

## 2019-06-30 ENCOUNTER — Ambulatory Visit: Payer: Medicaid Other | Admitting: Nutrition

## 2019-06-30 ENCOUNTER — Telehealth: Payer: Self-pay | Admitting: Nutrition

## 2019-06-30 NOTE — Telephone Encounter (Signed)
vm left to call to change appt to mychart or reschedule

## 2019-07-06 DIAGNOSIS — I10 Essential (primary) hypertension: Secondary | ICD-10-CM | POA: Diagnosis not present

## 2019-07-06 DIAGNOSIS — E039 Hypothyroidism, unspecified: Secondary | ICD-10-CM | POA: Diagnosis not present

## 2019-07-07 ENCOUNTER — Telehealth (INDEPENDENT_AMBULATORY_CARE_PROVIDER_SITE_OTHER): Payer: Medicaid Other | Admitting: Family Medicine

## 2019-07-07 ENCOUNTER — Other Ambulatory Visit: Payer: Self-pay

## 2019-07-07 DIAGNOSIS — E039 Hypothyroidism, unspecified: Secondary | ICD-10-CM | POA: Diagnosis not present

## 2019-07-07 DIAGNOSIS — R7989 Other specified abnormal findings of blood chemistry: Secondary | ICD-10-CM | POA: Diagnosis not present

## 2019-07-07 DIAGNOSIS — E785 Hyperlipidemia, unspecified: Secondary | ICD-10-CM | POA: Diagnosis not present

## 2019-07-07 DIAGNOSIS — E0800 Diabetes mellitus due to underlying condition with hyperosmolarity without nonketotic hyperglycemic-hyperosmolar coma (NKHHC): Secondary | ICD-10-CM

## 2019-07-07 DIAGNOSIS — I10 Essential (primary) hypertension: Secondary | ICD-10-CM

## 2019-07-07 LAB — COMPLETE METABOLIC PANEL WITH GFR
AG Ratio: 1.7 (calc) (ref 1.0–2.5)
ALT: 130 U/L — ABNORMAL HIGH (ref 6–29)
AST: 64 U/L — ABNORMAL HIGH (ref 10–30)
Albumin: 4.5 g/dL (ref 3.6–5.1)
Alkaline phosphatase (APISO): 64 U/L (ref 31–125)
BUN/Creatinine Ratio: 5 (calc) — ABNORMAL LOW (ref 6–22)
BUN: 3 mg/dL — ABNORMAL LOW (ref 7–25)
CO2: 28 mmol/L (ref 20–32)
Calcium: 9.9 mg/dL (ref 8.6–10.2)
Chloride: 100 mmol/L (ref 98–110)
Creat: 0.62 mg/dL (ref 0.50–1.10)
GFR, Est African American: 134 mL/min/{1.73_m2} (ref >=60)
GFR, Est Non African American: 116 mL/min/{1.73_m2} (ref >=60)
Globulin: 2.7 g/dL (calc) (ref 1.9–3.7)
Glucose, Bld: 131 mg/dL (ref 65–139)
Potassium: 4.5 mmol/L (ref 3.5–5.3)
Sodium: 139 mmol/L (ref 135–146)
Total Bilirubin: 0.5 mg/dL (ref 0.2–1.2)
Total Protein: 7.2 g/dL (ref 6.1–8.1)

## 2019-07-07 LAB — TSH: TSH: 4.37 mIU/L

## 2019-07-07 MED ORDER — LEVOTHYROXINE SODIUM 150 MCG PO TABS: 150.0000 ug | ORAL_TABLET | Freq: Every day | ORAL | 11 refills | Status: DC

## 2019-07-07 MED ORDER — HYDROCHLOROTHIAZIDE 12.5 MG PO CAPS: 12.5000 mg | ORAL_CAPSULE | Freq: Every day | ORAL | 2 refills | Status: DC

## 2019-07-07 MED ORDER — METFORMIN HCL 500 MG PO TABS: ORAL_TABLET | ORAL | 2 refills | Status: DC

## 2019-07-07 NOTE — Progress Notes (Signed)
Virtual Visit via Telephone Note  I connected with April Carney on 07/07/19 at  1:40 PM EST by telephone and verified that I am speaking with the correct person using two identifiers.  Location: Patient: home Provider:clinic   I discussed the limitations, risks, security and privacy concerns of performing an evaluation and management service by telephone and the availability of in person appointments. I also discussed with the patient that there may be a patient responsible charge related to this service. The patient expressed understanding and agreed to proceed.   History of Present Illness: Elevated LFT   Observations/Objective:   Assessment and Plan: 1. Elevated liver function tests Concern for source of elevation discussed-no h/o of hepatitis exposure, alcohol or drug use. Pt has not taken large amounts of tyelnol - COMPLETE METABOLIC PANEL WITH GFR - Hepatitis, Acute - Epstein-Barr virus VCA antibody panel - CBC with Differential  2. Essential hypertension Renal function normal-continue current medications 3. Hyperlipidemia, unspecified hyperlipidemia type Need lipid panel With elevated LFT would not be able to start a statin until resolved 4. Hypothyroidism, unspecified type TSH 4.37-normal  5. Diabetes mellitus due to underlying condition with hyperosmolarity without coma, unspecified whether long term insulin use (HCC) - CBC with Differential  Follow Up Instructions: Blood work-elevated LFT Increase dose of metfomin to 500mg  in the morning and 2-500mg  in the evening   I discussed the assessment and treatment plan with the patient. The patient was provided an opportunity to ask questions and all were answered. The patient agreed with the plan and demonstrated an understanding of the instructions.   The patient was advised to call back or seek an in-person evaluation if the symptoms worsen or if the condition fails to improve as anticipated.  I provided 15  minutes of non-face-to-face time during this encounter.    Hannah Beat, MD

## 2019-07-07 NOTE — Patient Instructions (Signed)
Blood work for recheck on liver function, Mono and Hepatitis  Increase metformin to 1 -500mg  with breakfast or lunch and 2-500mg  with evening meal  We will recheck other labwork next month

## 2019-07-11 ENCOUNTER — Telehealth: Payer: Self-pay

## 2019-07-11 DIAGNOSIS — E039 Hypothyroidism, unspecified: Secondary | ICD-10-CM

## 2019-07-11 MED ORDER — LEVOTHYROXINE SODIUM 150 MCG PO TABS: 150.0000 ug | ORAL_TABLET | Freq: Every day | ORAL | 11 refills | Status: DC

## 2019-07-11 NOTE — Telephone Encounter (Signed)
April Carney, CMA  

## 2019-07-14 DIAGNOSIS — E0800 Diabetes mellitus due to underlying condition with hyperosmolarity without nonketotic hyperglycemic-hyperosmolar coma (NKHHC): Secondary | ICD-10-CM | POA: Diagnosis not present

## 2019-07-14 DIAGNOSIS — R7989 Other specified abnormal findings of blood chemistry: Secondary | ICD-10-CM | POA: Diagnosis not present

## 2019-07-15 ENCOUNTER — Telehealth: Payer: Self-pay

## 2019-07-15 ENCOUNTER — Other Ambulatory Visit: Payer: Self-pay | Admitting: Family Medicine

## 2019-07-15 DIAGNOSIS — E039 Hypothyroidism, unspecified: Secondary | ICD-10-CM

## 2019-07-15 DIAGNOSIS — R7989 Other specified abnormal findings of blood chemistry: Secondary | ICD-10-CM

## 2019-07-15 LAB — CBC WITH DIFFERENTIAL/PLATELET
Absolute Monocytes: 551 cells/uL (ref 200–950)
Basophils Absolute: 48 cells/uL (ref 0–200)
Basophils Relative: 0.7 %
Eosinophils Absolute: 333 cells/uL (ref 15–500)
Eosinophils Relative: 4.9 %
HCT: 32.5 % — ABNORMAL LOW (ref 35.0–45.0)
Hemoglobin: 10.1 g/dL — ABNORMAL LOW (ref 11.7–15.5)
Lymphs Abs: 2774 cells/uL (ref 850–3900)
MCH: 24 pg — ABNORMAL LOW (ref 27.0–33.0)
MCHC: 31.1 g/dL — ABNORMAL LOW (ref 32.0–36.0)
MCV: 77.2 fL — ABNORMAL LOW (ref 80.0–100.0)
MPV: 10.7 fL (ref 7.5–12.5)
Monocytes Relative: 8.1 %
Neutro Abs: 3094 cells/uL (ref 1500–7800)
Neutrophils Relative %: 45.5 %
Platelets: 462 10*3/uL — ABNORMAL HIGH (ref 140–400)
RBC: 4.21 10*6/uL (ref 3.80–5.10)
RDW: 14.2 % (ref 11.0–15.0)
Total Lymphocyte: 40.8 %
WBC: 6.8 10*3/uL (ref 3.8–10.8)

## 2019-07-15 LAB — HEPATITIS PANEL, ACUTE
Hep A IgM: NONREACTIVE
Hep B C IgM: NONREACTIVE
Hepatitis B Surface Ag: NONREACTIVE
Hepatitis C Ab: NONREACTIVE
SIGNAL TO CUT-OFF: 0.01 (ref <1.00)

## 2019-07-15 LAB — COMPLETE METABOLIC PANEL WITH GFR
AG Ratio: 1.5 (calc) (ref 1.0–2.5)
ALT: 180 U/L — ABNORMAL HIGH (ref 6–29)
AST: 152 U/L — ABNORMAL HIGH (ref 10–30)
Albumin: 4.3 g/dL (ref 3.6–5.1)
Alkaline phosphatase (APISO): 61 U/L (ref 31–125)
BUN: 7 mg/dL (ref 7–25)
CO2: 26 mmol/L (ref 20–32)
Calcium: 9.9 mg/dL (ref 8.6–10.2)
Chloride: 98 mmol/L (ref 98–110)
Creat: 0.65 mg/dL (ref 0.50–1.10)
GFR, Est African American: 132 mL/min/{1.73_m2} (ref >=60)
GFR, Est Non African American: 114 mL/min/{1.73_m2} (ref >=60)
Globulin: 2.8 g/dL (calc) (ref 1.9–3.7)
Glucose, Bld: 182 mg/dL — ABNORMAL HIGH (ref 65–139)
Potassium: 4.6 mmol/L (ref 3.5–5.3)
Sodium: 138 mmol/L (ref 135–146)
Total Bilirubin: 0.5 mg/dL (ref 0.2–1.2)
Total Protein: 7.1 g/dL (ref 6.1–8.1)

## 2019-07-15 LAB — EPSTEIN-BARR VIRUS VCA ANTIBODY PANEL
EBV NA IgG: 314 U/mL — ABNORMAL HIGH
EBV VCA IgG: 104 U/mL — ABNORMAL HIGH
EBV VCA IgM: <36 U/mL

## 2019-07-15 MED ORDER — LEVOTHYROXINE SODIUM 150 MCG PO TABS: 150.0000 ug | ORAL_TABLET | Freq: Every day | ORAL | 11 refills | Status: DC

## 2019-07-15 NOTE — Telephone Encounter (Signed)
[name], CMA  

## 2019-07-20 ENCOUNTER — Encounter: Payer: Self-pay | Admitting: Internal Medicine

## 2019-07-28 ENCOUNTER — Other Ambulatory Visit: Payer: Self-pay | Admitting: Family Medicine

## 2019-08-04 ENCOUNTER — Encounter: Payer: Medicaid Other | Attending: Family Medicine | Admitting: Nutrition

## 2019-08-04 ENCOUNTER — Other Ambulatory Visit: Payer: Self-pay

## 2019-08-04 ENCOUNTER — Encounter: Payer: Self-pay | Admitting: Nutrition

## 2019-08-04 VITALS — Ht 65.0 in | Wt 296.4 lb

## 2019-08-04 DIAGNOSIS — E1165 Type 2 diabetes mellitus with hyperglycemia: Secondary | ICD-10-CM | POA: Insufficient documentation

## 2019-08-04 DIAGNOSIS — I10 Essential (primary) hypertension: Secondary | ICD-10-CM | POA: Insufficient documentation

## 2019-08-04 DIAGNOSIS — E782 Mixed hyperlipidemia: Secondary | ICD-10-CM | POA: Insufficient documentation

## 2019-08-04 DIAGNOSIS — E118 Type 2 diabetes mellitus with unspecified complications: Secondary | ICD-10-CM | POA: Diagnosis not present

## 2019-08-04 DIAGNOSIS — IMO0002 Reserved for concepts with insufficient information to code with codable children: Secondary | ICD-10-CM

## 2019-08-04 DIAGNOSIS — R7989 Other specified abnormal findings of blood chemistry: Secondary | ICD-10-CM | POA: Diagnosis not present

## 2019-08-04 NOTE — Patient Instructions (Signed)
Goals Follow My Plate  Cut out sodas  Cut out fast food  Exercise 30 minutes three times per week Test twice a day. Lose 1-2 lbs per week Get A1 3-6 months down to 7%

## 2019-08-04 NOTE — Progress Notes (Signed)
  Medical Nutrition Therapy:  Appt start time: 1530 end time:  1630.   Assessment:  Primary concerns today: DM Type 2 x 3 months-just diagnosed. Has cut down on sodas, cut down on sweet. Working on cutting them out completely. See Dr. Judee Clara for PCP -new patient recently. A1C 13.8%. Metformin 500 mg once in am and 2 in evening. Kids 71, 37 yr old boys. FBS: 122.. 132. 144. 128. 128. 123. 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 Eats 3 meals a day. Not much exercise. Motivated to make changes with her diet and exercise to improve her DM and reduce complications. Blood sugars are much improved since cutting out sodas and sweets.  Diet is still inconsistent to meet her needs and excessive in CHO at times, and low in fresh fruits and vegetables. Lab Results  Component Value Date   HGBA1C 13.8 (H) 05/02/2019   CMP Latest Ref Rng & Units 07/14/2019 07/06/2019 07/13/2018  Glucose 65 - 139 mg/dL 149(F) 026 378(H)  BUN 7 - 25 mg/dL 7 3(L) <8(I)  Creatinine 0.50 - 1.10 mg/dL 5.02 7.74 1.28  Sodium 135 - 146 mmol/L 138 139 138  Potassium 3.5 - 5.3 mmol/L 4.6 4.5 4.3  Chloride 98 - 110 mmol/L 98 100 104  CO2 20 - 32 mmol/L 26 28 -  Calcium 8.6 - 10.2 mg/dL 9.9 9.9 -  Total Protein 6.1 - 8.1 g/dL 7.1 7.2 -  Total Bilirubin 0.2 - 1.2 mg/dL 0.5 0.5 -  Alkaline Phos 33 - 115 U/L - - -  AST 10 - 30 U/L 152(H) 64(H) -  ALT 6 - 29 U/L 180(H) 130(H) -     Preferred Learning Style:  Auditory  Visual  Hands on  Learning Readiness:   Not ready  Ready  Change in progress   MEDICATIONS:    DIETARY INTAKE:  24-hr recall:  B) CHerrios with 2% milk,  L) skipped D) BBQ, hush puppies, water  Usual physical activity: AD:  Estimated energy needs: 1200  calories 135 g carbohydrates 90 g protein 33 g fat  Progress Towards Goal(s):  In  progress.   Nutritional Diagnosis:  NB-1.1 Food and nutrition-related knowledge deficit As related to Diabetes Type 2.  As evidenced by A1C 13.8%.    Intervention:  Nutrition and Diabetes education provided on My Plate, CHO counting, meal planning, portion sizes, timing of meals, avoiding snacks between meals unless having a low blood sugar, target ranges for A1C and blood sugars, signs/symptoms and treatment of hyper/hypoglycemia, monitoring blood sugars, taking medications as prescribed, benefits of exercising 30 minutes per day and prevention of complications of DM.  Goals Follow My Plate  Cut out sodas  Cut out fast food  Exercise 30 minutes three times per week Test twice a day. Lose 1-2 lbs per week Get A1 3-6 months down to 7%  Teaching Method Utilized:  Visual Auditory Hands on  Handouts given during visit include:  The Plate Method   Meal Plan Card  Diabetes Instructions.    Barriers to learning/adherence to lifestyle change: none  Demonstrated degree of understanding via:  Teach Back   Monitoring/Evaluation:  Dietary intake, exercise, and body weight in 1 month(s).

## 2019-08-08 ENCOUNTER — Encounter: Payer: Self-pay | Admitting: Nutrition

## 2019-08-12 ENCOUNTER — Ambulatory Visit: Payer: Medicaid Other | Admitting: Gastroenterology

## 2019-08-18 ENCOUNTER — Other Ambulatory Visit: Payer: Medicaid Other | Admitting: Women's Health

## 2019-08-21 NOTE — Progress Notes (Signed)
Referring Provider: Maryruth Hancock, MD Primary Care Physician:  Maryruth Hancock, MD Primary Gastroenterologist:  Dr. Gala Romney  Chief Complaint  Patient presents with  . Elevated Hepatic Enzymes    HPI:   April Carney is a 39 y.o. female presenting today at the request of Corum, Rex Kras, MD for elevated LFTs.  History of diabetes, HLD, HTN, hypothyroidism, and depression.  Labs on 07/06/2019 with AST 64H, ALT 130H, alk phos 64, total bilirubin 0.5. Repeat labs on 07/14/2019 with AST 152, ALT 180.  Alk phos and total bilirubin remained normal.  CBC remarkable for hemoglobin 10.1L with microcytic indices and platelets 462H.  Acute hepatitis panel negative.  Per chart review, very mild ALT elevation dating back to 2017.  Hemoglobin A1c 13.8 in October 2020. This was a new diagnosis of diabetes. She was started on Metformin.   Today: Oldest son has history of biliary atresia. Had liver transplant in 2015. No other family history of liver disease. No personal history of liver trouble. Very rare alcohol consumption. Has been 6 months since last drink. Tylenol as needed. Less than once a week. Will take 500 mg. No IV or intranasal drug use. Has tattoos. 10 + years ago. At a tattoo parlor. Weight has been stable. Personal history of hypothyroidism. Mother also with thyroid disorder. No family history of IBD, celiac disease, alpha-1 antitrypsin deficiency, hemochromatosis, or Wilson's disease. Not taking Lipitor. States she stopped this 6 months ago.   Rare NSAID use.   No abdominal pain. No swelling in abdomen. Intermittent swelling in her feet. No confusion. No yellowing of eyes or skin. No dark urine. BMs daily. No constipation or diarrhea. No nausea or vomiting. No GERD symptoms. No dysphagia. No blood in the stool or black stools.   Anemia: History of heavy periods. Reports abnormal periods from June to October 2020. Would have heavy bleeding for a week or two and only have 1-2 weeks between  periods. Has [place] which revealed small ovarian cyst. Periods have returned to normal. No intervention required. Not sure what changed.   No recent recheck of A1C. Plans to recheck in the next month or two.   Denies fever, chills, lightheadedness, dizziness, presyncope, syncope, cold or flulike symptoms, chest pain, heart palpitations, shortness of breath, cough.  Past Medical History:  Diagnosis Date  . Depression   . Diabetes mellitus without complication ([employer])   . Hyperlipidemia   . Hypertension   . Hypoactive thyroid   . Hypothyroid   . Mental disorder    depression and PTSD    Past Surgical History:  Procedure Laterality Date  . CESAREAN SECTION      Current Outpatient Medications  Medication Sig Dispense Refill  . Accu-Chek FastClix Lancets MISC USE 1 LANCET TO CHECK GLUCOSE UP TO 4 TIMES DAILY 102 each 0  . ACCU-CHEK GUIDE test strip USE 1 STRIP TO CHECK GLUCOSE UP TO 4 TIMES DAILY AS DIRECTED 100 each 0  . acetaminophen (TYLENOL) 325 MG tablet Take 650 mg by mouth every 6 (six) hours as needed.    . blood glucose meter kit and supplies KIT Dispense based on patient and insurance preference. Use up to four times daily as directed. (FOR ICD-9 250.00, 250.01). 1 each 0  . cetirizine (ZYRTEC) 10 MG tablet Take 10 mg by mouth daily.    . diphenhydrAMINE (BENADRYL) 25 MG tablet Take 25 mg by mouth every 6 (six) hours as needed.    Marland Kitchen FLUoxetine (PROZAC) 20 MG  capsule Take three pills each morning as directed. 90 capsule 3  . hydrochlorothiazide (MICROZIDE) 12.5 MG capsule Take 1 capsule (12.5 mg total) by mouth daily. 30 capsule 2  . ibuprofen (ADVIL) 200 MG tablet Take 400 mg by mouth every 6 (six) hours as needed.    Marland Kitchen levothyroxine (SYNTHROID) 150 MCG tablet Take 1 tablet (150 mcg total) by mouth daily. 30 tablet 11  . metFORMIN (GLUCOPHAGE) 500 MG tablet Take one po with breakfast and two po with the evening meal 90 tablet 2  . traZODone (DESYREL) 100 MG tablet Take 100 mg by  mouth at bedtime as needed.     Marland Kitchen atorvastatin (LIPITOR) 20 MG tablet Take 1 tablet (20 mg total) by mouth daily. (Patient not taking: Reported on 08/22/2019) 90 tablet 3   No current facility-administered medications for this visit.    Allergies as of 08/22/2019  . (No Known Allergies)    Family History  Problem Relation Age of Onset  . Diabetes Mother   . Hypertension Mother   . Hyperlipidemia Mother   . Kidney disease Mother   . Stroke Mother   . Diabetes Father   . Heart disease Father   . Liver disease Son        biliary atresia   . Asthma Son   . ADD / ADHD Son   . ODD Son   . Asthma Son   . ADD / ADHD Son   . ODD Son   . Cancer Paternal Aunt   . Colon cancer Neg Hx     Social History   Socioeconomic History  . Marital status: Significant Other    Spouse name: Not on file  . Number of children: Not on file  . Years of education: Not on file  . Highest education level: Not on file  Occupational History  . Not on file  Tobacco Use  . Smoking status: Never Smoker  . Smokeless tobacco: Never Used  Substance and Sexual Activity  . Alcohol use: Yes    Comment: "very rarely"  . Drug use: No  . Sexual activity: Yes    Partners: Male    Birth control/protection: None  Other Topics Concern  . Not on file  Social History Narrative   Lives in [place], [place]. Dating. Enjoys reading and crafting. Has two children. Does not work outside the home. Children are in school.    Social Determinants of Health   Financial Resource Strain:   . Difficulty of Paying Living Expenses: Not on file  Food Insecurity:   . Worried About Charity fundraiser in the Last Year: Not on file  . Ran Out of Food in the Last Year: Not on file  Transportation Needs:   . Lack of Transportation (Medical): Not on file  . Lack of Transportation (Non-Medical): Not on file  Physical Activity:   . Days of Exercise per Week: Not on file  . Minutes of Exercise per Session: Not on file  Stress:    . Feeling of Stress : Not on file  Social Connections:   . Frequency of Communication with Friends and Family: Not on file  . Frequency of Social Gatherings with Friends and Family: Not on file  . Attends Religious Services: Not on file  . Active Member of Clubs or Organizations: Not on file  . Attends Archivist Meetings: Not on file  . Marital Status: Not on file  Intimate Partner Violence:   . Fear of Current  or Ex-Partner: Not on file  . Emotionally Abused: Not on file  . Physically Abused: Not on file  . Sexually Abused: Not on file    Review of Systems: Gen: See HPI CV: See HPI Resp: See HPI GI: See HPI GU : Denies urinary burning, urinary frequency, urinary hesitancy, or hematuria. MS: Occasional right arm pain from break many years ago. No other joint pain.   Derm: Denies rash.  Psych: Admits to depression but medications work well. Heme: See HPI  Physical Exam: BP (!) 143/89   Pulse 77   Temp (!) 96.6 F (35.9 C) (Temporal)   Ht '5\' 5"'$  (1.651 m)   Wt 296 lb (134.3 kg)   LMP 08/20/2019 (Exact Date)   BMI 49.26 kg/m  General:   Alert and oriented. Pleasant and cooperative. Well-nourished and well-developed.  Head:  Normocephalic and atraumatic. Eyes:  Without icterus, sclera clear and conjunctiva pink.  Ears:  Normal auditory acuity. Lungs:  Clear to auscultation bilaterally. No wheezes, rales, or rhonchi. No distress.  Heart:  S1, S2 present without murmurs appreciated.  Abdomen:  +BS, soft, non-tender and non-distended. No HSM noted. No guarding or rebound. No masses appreciated.  Rectal:  Deferred  Msk:  Symmetrical without gross deformities. Normal posture. Extremities:  Without edema. Neurologic:  Alert and oriented x4;  grossly normal neurologically. Skin:  Intact without significant lesions or rashes. Psych: Normal mood and affect.

## 2019-08-22 ENCOUNTER — Other Ambulatory Visit: Payer: Self-pay

## 2019-08-22 ENCOUNTER — Ambulatory Visit: Payer: Medicaid Other | Admitting: Gastroenterology

## 2019-08-22 ENCOUNTER — Encounter: Payer: Self-pay | Admitting: Gastroenterology

## 2019-08-22 ENCOUNTER — Telehealth: Payer: Self-pay

## 2019-08-22 VITALS — BP 143/89 | HR 77 | Temp 96.6°F | Ht 65.0 in | Wt 296.0 lb

## 2019-08-22 DIAGNOSIS — R7989 Other specified abnormal findings of blood chemistry: Secondary | ICD-10-CM

## 2019-08-22 DIAGNOSIS — D649 Anemia, unspecified: Secondary | ICD-10-CM | POA: Diagnosis not present

## 2019-08-22 NOTE — Patient Instructions (Addendum)
Please have labs and ultrasound of your abdomen completed.  We will call you with results and further recommendations.  Continue working on tight control of your glucose.  This is very important regarding possible fatty liver.  Work on 1-2# weight loss per week until ideal body weight through exercise & diet.  Follow low fat/cholesterol diet.   Avoid sweets, sodas, fruit juices, sweetened beverages like tea, etc. Gradually increase exercise from 15 min daily up to 1 hr per day 5 days/week. Limit alcohol use. Continue to limit Tylenol as much as possible.  Avoid all over-the-counter supplements including herbal teas.  Follow-up with your PCP on anemia.  I suspect this is likely related to recent blood loss secondary to irregular/heavy menstrual cycles.  We will follow up with you in 3 months.  Call if questions or concerns prior.  Ermalinda Memos, PA-C [facility]

## 2019-08-22 NOTE — Assessment & Plan Note (Addendum)
Appears patient has had mild anemia with hemoglobin 10.2 since 07/13/2018.  Appears hemoglobin was in the 12-13 range prior to this. Most recently, hemoglobin stable at 10.1 on 07/14/2019 with microcytic indices.  Suspect this is likely related to menstrual cycles as she reports history of heavy periods.  Also recently with abnormal periods from June-October 2020 with heavy bleeding for 1-2 weeks and only 1-2 weeks between periods.  Had pelvic ultrasound with physiologic left ovarian cyst.  Since then, periods have returned to baseline without any intervention.  No significant upper or lower GI symptoms. She denies bright red blood per rectum, melena, unintentional weight loss, or family history of colon cancer.  With known heavy vaginal bleeding, doubt GI source as anemia has been stable over the last year.    She was advised to follow-up with PCP. If hemoglobin were to continue to trend down despite management of menorrhagia, would need to consider endoscopic evaluation.

## 2019-08-22 NOTE — Assessment & Plan Note (Addendum)
39 year old female with past medical history of obesity, hypothyroidism, HTN, HLD, diabetes, and depression who presents for further evaluation of elevated LFTs.  ALT has been very mildly elevated dating back to 2017.  Labs on 07/06/2019 with AST 64H, ALT 130H, alk phos 64, total bilirubin 0.5. Repeat labs on 07/14/2019 with AST 152, ALT 180.  Alk phos and total bilirubin remained normal.  CBC remarkable for hemoglobin 10.1L with microcytic indices and platelets 462.  Acute hepatitis panel negative.  Of note, diabetes is a new diagnosis with hemoglobin A1c 13.8 in October 2020.  Historically on Lipitor but discontinued this about 6 months ago.  Rare use of Tylenol.  No regular alcohol use.  Family history significant for son with biliary atresia s/p liver transplant 2015.  Otherwise, no family history of liver disease, alpha-1 antitrypsin deficiency, hemochromatosis, Wilson's disease, or celiac disease.  Mother has hypothyroidism.  No signs or symptoms of advanced liver disease.   As patient has significant risk factors for NAFLD/NASH, I am most suspicious that her LFTs are elevated secondary to this.  Possible med effect with recently starting Metformin for new diagnosis of diabetes.   We will start with updating HFP to follow the trend and obtain iron panel and  abdominal ultrasound.  If LFTs continue to rise, will complete additional serologies to evaluate for possible autoimmune causes etc.  May need to consider trial of holding Metformin in the future. Continue working on tight control of glucose. Work on 1-2# weight loss per week until ideal body weight through exercise & diet. Follow low fat/cholesterol diet.   Avoid sweets, sodas, fruit juices, sweetened beverages like tea, etc. Gradually increase exercise from 15 min daily up to 1 hr per day 5 days/week. Limit alcohol use. Limit Tylenol. Avoid all over-the-counter supplements including herbal teas. Further recommendations to follow completion of  labs and ultrasound. Follow-up in office in 3 months.

## 2019-08-22 NOTE — Telephone Encounter (Signed)
[place] abd scheduled for 08/29/19 at 8:30am, arrive at 8:15am. NPO after midnight prior to test.   Called and informed pt of appt. Letter mailed.

## 2019-08-23 DIAGNOSIS — R7989 Other specified abnormal findings of blood chemistry: Secondary | ICD-10-CM | POA: Diagnosis not present

## 2019-08-23 DIAGNOSIS — D649 Anemia, unspecified: Secondary | ICD-10-CM | POA: Diagnosis not present

## 2019-08-23 NOTE — Telephone Encounter (Signed)
PA for [place] submitted via [employer]. Case pending. Service order: [id number]. Clinical notes uploaded to [employer].

## 2019-08-24 LAB — HEPATIC FUNCTION PANEL
AG Ratio: 1.8 (calc) (ref 1.0–2.5)
ALT: 121 U/L — ABNORMAL HIGH (ref 6–29)
AST: 84 U/L — ABNORMAL HIGH (ref 10–30)
Albumin: 4.7 g/dL (ref 3.6–5.1)
Alkaline phosphatase (APISO): 58 U/L (ref 31–125)
Bilirubin, Direct: 0.1 mg/dL (ref 0.0–0.2)
Globulin: 2.6 g/dL (calc) (ref 1.9–3.7)
Indirect Bilirubin: 0.5 mg/dL (calc) (ref 0.2–1.2)
Total Bilirubin: 0.6 mg/dL (ref 0.2–1.2)
Total Protein: 7.3 g/dL (ref 6.1–8.1)

## 2019-08-24 LAB — IRON,TIBC AND FERRITIN PANEL
%SAT: 8 % (calc) — ABNORMAL LOW (ref 16–45)
Ferritin: 18 ng/mL (ref 16–154)
Iron: 47 ug/dL (ref 40–190)
TIBC: 553 mcg/dL (calc) — ABNORMAL HIGH (ref 250–450)

## 2019-08-24 NOTE — Progress Notes (Signed)
LFTs are still elevated but have improved. AST 84 and ALT 121, down from AST 152 and ALT 180 one month ago.   Iron panel with iron and ferritin within normal limits but on the lower end. % Saturation is low. As we discussed at her office visit, she is mildly anemic which I suspect this is likely related to her heavy menstrual cycles, and she should follow-up with her PCP for further recommendations. She should consider starting an over the counter iron supplement to help boost her iron stores.   We are waiting on [place] to be completed. Further recommendations to follow.

## 2019-08-24 NOTE — Telephone Encounter (Signed)
[place] approved. PA# [id number], valid 08/23/19-02/19/20.

## 2019-08-29 ENCOUNTER — Ambulatory Visit (HOSPITAL_COMMUNITY): Payer: Medicaid Other

## 2019-09-02 ENCOUNTER — Ambulatory Visit: Payer: Medicaid Other | Attending: Internal Medicine

## 2019-09-02 ENCOUNTER — Other Ambulatory Visit: Payer: Self-pay

## 2019-09-02 ENCOUNTER — Other Ambulatory Visit: Payer: Self-pay | Admitting: Family Medicine

## 2019-09-02 DIAGNOSIS — Z20822 Contact with and (suspected) exposure to covid-19: Secondary | ICD-10-CM

## 2019-09-03 ENCOUNTER — Encounter (INDEPENDENT_AMBULATORY_CARE_PROVIDER_SITE_OTHER): Payer: Self-pay

## 2019-09-04 LAB — NOVEL CORONAVIRUS, NAA: SARS-CoV-2, NAA: NOT DETECTED

## 2019-09-05 ENCOUNTER — Ambulatory Visit (HOSPITAL_COMMUNITY): Payer: Medicaid Other

## 2019-09-14 ENCOUNTER — Ambulatory Visit (HOSPITAL_COMMUNITY)
Admission: RE | Admit: 2019-09-14 | Discharge: 2019-09-14 | Disposition: A | Discharge status: Home / Self Care | Payer: Medicaid Other | Source: Ambulatory Visit | Attending: Gastroenterology | Admitting: Gastroenterology

## 2019-09-14 ENCOUNTER — Other Ambulatory Visit: Payer: Self-pay

## 2019-09-14 DIAGNOSIS — R7989 Other specified abnormal findings of blood chemistry: Secondary | ICD-10-CM | POA: Diagnosis not present

## 2019-09-14 DIAGNOSIS — K8020 Calculus of gallbladder without cholecystitis without obstruction: Secondary | ICD-10-CM | POA: Diagnosis not present

## 2019-09-14 NOTE — Progress Notes (Signed)
US reveals fatty liver. She does have gallstones but no signs of inflammation of her gallbladder. As she was without abdominal pain, she doesn't necessarily need her gallbladder removed.   For Fatty Liver and Elevated LFTs: Continue working on tight control of glucose. Work on 1-2# weight loss per week until ideal body weight through exercise & diet. Follow low fat/cholesterol diet.  Avoid sweets, sodas, fruit juices, sweetened beverages like tea, etc. Gradually increase exercise from 15 min daily up to 1 hr per day 5 days/week. Limit alcohol use. Limit Tylenol. Avoid all over-the-counter supplements including herbal teas.  Please mail fatty liver handout.   We will follow-up in office as planned.

## 2019-09-15 ENCOUNTER — Ambulatory Visit: Payer: Medicaid Other | Admitting: Nutrition

## 2019-09-26 ENCOUNTER — Other Ambulatory Visit: Payer: Medicaid Other | Admitting: Women's Health

## 2019-10-09 ENCOUNTER — Other Ambulatory Visit: Payer: Self-pay | Admitting: Family Medicine

## 2019-10-25 ENCOUNTER — Other Ambulatory Visit: Payer: Medicaid Other | Admitting: Women's Health

## 2019-11-04 ENCOUNTER — Ambulatory Visit: Payer: Medicaid Other

## 2019-11-09 ENCOUNTER — Other Ambulatory Visit: Payer: Self-pay | Admitting: Family Medicine

## 2019-11-09 DIAGNOSIS — I10 Essential (primary) hypertension: Secondary | ICD-10-CM

## 2019-11-22 NOTE — Progress Notes (Deleted)
Referring Provider: Maryruth Hancock, MD Primary Care Physician:  Maryruth Hancock, MD Primary GI Physician: Dr. Gala Romney  No chief complaint on file.   HPI:   April Carney is a 39 y.o. female with history of diabetes, HLD, HTN, hypothyroidism, depression, and elevated LFTs dating back to 2017, and mild anemia since 2019 in the setting of abnormal uterine bleeding/menorrhagia, no prior GI evaluation although she denies overt GI bleeding.  She presents today for follow-up of elevated LFTs.   She was last seen in our office on 08/22/2019 at time of initial consult for the same.  LFTs had increased in December 2020 with AST 152, ALT 180.  Alk phos and total bilirubin normal.  Hemoglobin 10.1, platelets 462.  Acute hepatitis panel was negative.  Newly diagnosed with diabetes with A1c 13.8 in October 2020.  Had been on Lipitor but discontinued this 6 months prior.  Rare Tylenol.  Family history significant for son with biliary atresia s/p liver transplant in 2015.  No other family history of liver disease.  Suspected NAFLD/NASH as cause of elevated LFTs.  Possible med effect with starting Metformin.  Plans to update HFP, obtain iron panel, and abdominal ultrasound.  If LFTs continue to rise, would complete additional serologies.  Instructions for fatty liver were provided and discussed importance of tight control of glucose.  Also advised to avoid all OTC supplements and herbal teas, limit alcohol and Tylenol.  Labs completed 08/23/2019.  LFTs still elevated but improved with AST 84 and ALT 121.  Iron panel with ferritin 18 (low normal), iron 47 (low normal ), and percent saturation low at 8%.  Recommended following up with PCP on menorrhagia and start OTC iron supplement. Ultrasound 09/14/2019 with diffuse hepatic steatosis, cholelithiasis without cholecystitis.  Today:  Elevated LFTs:  Anemia: Could consider repeating CBC/IFOBT.     Past Medical History:  Diagnosis Date  . Depression   .  Diabetes mellitus without complication ([place])   . Hyperlipidemia   . Hypertension   . Hypoactive thyroid   . Hypothyroid   . Mental disorder    depression and PTSD    Past Surgical History:  Procedure Laterality Date  . CESAREAN SECTION      Current Outpatient Medications  Medication Sig Dispense Refill  . Accu-Chek FastClix Lancets MISC USE 1  TO CHECK GLUCOSE UP TO 4 TIMES DAILY 102 each 0  . ACCU-CHEK GUIDE test strip USE 1 STRIP TO CHECK GLUCOSE UP TO 4 TIMES DAILY AS DIRECTED 100 each 0  . acetaminophen (TYLENOL) 325 MG tablet Take 650 mg by mouth every 6 (six) hours as needed.    Marland Kitchen atorvastatin (LIPITOR) 20 MG tablet Take 1 tablet (20 mg total) by mouth daily. (Patient not taking: Reported on 08/22/2019) 90 tablet 3  . blood glucose meter kit and supplies KIT Dispense based on patient and insurance preference. Use up to four times daily as directed. (FOR ICD-9 250.00, 250.01). 1 each 0  . cetirizine (ZYRTEC) 10 MG tablet Take 10 mg by mouth daily.    . diphenhydrAMINE (BENADRYL) 25 MG tablet Take 25 mg by mouth every 6 (six) hours as needed.    Marland Kitchen FLUoxetine (PROZAC) 20 MG capsule Take three pills each morning as directed. 90 capsule 3  . hydrochlorothiazide (MICROZIDE) 12.5 MG capsule Take 1 capsule by mouth once daily 90 capsule 0  . ibuprofen (ADVIL) 200 MG tablet Take 400 mg by mouth every 6 (six) hours as needed.    Marland Kitchen  levothyroxine (SYNTHROID) 150 MCG tablet Take 1 tablet (150 mcg total) by mouth daily. 30 tablet 11  . metFORMIN (GLUCOPHAGE) 500 MG tablet TAKE 1 TABLET BY MOUTH WITH BREAKFAST AND 2 TABLETS WITH THE EVENING MEAL 90 tablet 0  . traZODone (DESYREL) 100 MG tablet Take 100 mg by mouth at bedtime as needed.      No current facility-administered medications for this visit.    Allergies as of 11/23/2019  . (No Known Allergies)    Family History  Problem Relation Age of Onset  . Diabetes Mother   . Hypertension Mother   . Hyperlipidemia Mother   . Kidney  disease Mother   . Stroke Mother   . Diabetes Father   . Heart disease Father   . Liver disease Son        biliary atresia   . Asthma Son   . ADD / ADHD Son   . ODD Son   . Asthma Son   . ADD / ADHD Son   . ODD Son   . Cancer Paternal Aunt   . Colon cancer Neg Hx     Social History   Socioeconomic History  . Marital status: Significant Other    Spouse name: Not on file  . Number of children: Not on file  . Years of education: Not on file  . Highest education level: Not on file  Occupational History  . Not on file  Tobacco Use  . Smoking status: Never Smoker  . Smokeless tobacco: Never Used  Substance and Sexual Activity  . Alcohol use: Yes    Comment: "very rarely"  . Drug use: No  . Sexual activity: Yes    Partners: Male    Birth control/protection: None  Other Topics Concern  . Not on file  Social History Narrative   Lives in [place], [place]. Dating. Enjoys reading and crafting. Has two children. Does not work outside the home. Children are in school.    Social Determinants of Health   Financial Resource Strain:   . Difficulty of Paying Living Expenses:   Food Insecurity:   . Worried About Charity fundraiser in the Last Year:   . Arboriculturist in the Last Year:   Transportation Needs:   . Film/video editor (Medical):   Marland Kitchen Lack of Transportation (Non-Medical):   Physical Activity:   . Days of Exercise per Week:   . Minutes of Exercise per Session:   Stress:   . Feeling of Stress :   Social Connections:   . Frequency of Communication with Friends and Family:   . Frequency of Social Gatherings with Friends and Family:   . Attends Religious Services:   . Active Member of Clubs or Organizations:   . Attends Archivist Meetings:   Marland Kitchen Marital Status:     Review of Systems: Gen: Denies fever, chills, anorexia. Denies fatigue, weakness, weight loss.  CV: Denies chest pain, palpitations, syncope, peripheral edema, and claudication. Resp:  Denies dyspnea at rest, cough, wheezing, coughing up blood, and pleurisy. GI: Denies vomiting blood, jaundice, and fecal incontinence.   Denies dysphagia or odynophagia. Derm: Denies rash, itching, dry skin Psych: Denies depression, anxiety, memory loss, confusion. No homicidal or suicidal ideation.  Heme: Denies bruising, bleeding, and enlarged lymph nodes.  Physical Exam: There were no vitals taken for this visit. General:   Alert and oriented. No distress noted. Pleasant and cooperative.  Head:  Normocephalic and atraumatic. Eyes:  Conjuctiva clear  without scleral icterus. Mouth:  Oral mucosa pink and moist. Good dentition. No lesions. Heart:  S1, S2 present without murmurs appreciated. Lungs:  Clear to auscultation bilaterally. No wheezes, rales, or rhonchi. No distress.  Abdomen:  +BS, soft, non-tender and non-distended. No rebound or guarding. No HSM or masses noted. Msk:  Symmetrical without gross deformities. Normal posture. Extremities:  Without edema. Neurologic:  Alert and  oriented x4 Psych:  Alert and cooperative. Normal mood and affect.

## 2019-11-23 ENCOUNTER — Encounter: Payer: Self-pay | Admitting: Internal Medicine

## 2019-11-23 ENCOUNTER — Ambulatory Visit: Payer: Medicaid Other | Admitting: Gastroenterology

## 2019-11-23 ENCOUNTER — Telehealth: Payer: Self-pay | Admitting: Internal Medicine

## 2019-11-23 NOTE — Telephone Encounter (Signed)
Patient was a no show and letter sent  °

## 2019-11-23 NOTE — Telephone Encounter (Signed)
Noted  

## 2019-11-24 ENCOUNTER — Other Ambulatory Visit: Payer: Self-pay | Admitting: Family Medicine

## 2020-01-02 ENCOUNTER — Other Ambulatory Visit: Payer: Self-pay | Admitting: Family Medicine

## 2020-01-02 DIAGNOSIS — I10 Essential (primary) hypertension: Secondary | ICD-10-CM

## 2020-01-02 MED ORDER — METFORMIN HCL 500 MG PO TABS: ORAL_TABLET | ORAL | 0 refills | Status: DC

## 2020-01-02 MED ORDER — ACCU-CHEK FASTCLIX LANCETS MISC: 0 refills | Status: AC

## 2020-01-02 MED ORDER — ACCU-CHEK GUIDE VI STRP: ORAL_STRIP | 0 refills | Status: AC

## 2020-01-02 MED ORDER — HYDROCHLOROTHIAZIDE 12.5 MG PO CAPS: 12.5000 mg | ORAL_CAPSULE | Freq: Every day | ORAL | 0 refills | Status: DC

## 2020-02-02 ENCOUNTER — Other Ambulatory Visit: Payer: Self-pay | Admitting: Family Medicine

## 2020-02-06 ENCOUNTER — Other Ambulatory Visit: Payer: Self-pay | Admitting: Family Medicine

## 2020-02-13 ENCOUNTER — Encounter: Payer: Self-pay | Admitting: Family Medicine

## 2020-04-25 ENCOUNTER — Other Ambulatory Visit: Payer: Self-pay

## 2020-04-25 ENCOUNTER — Ambulatory Visit (INDEPENDENT_AMBULATORY_CARE_PROVIDER_SITE_OTHER): Payer: Medicaid Other | Admitting: Women's Health

## 2020-04-25 ENCOUNTER — Encounter: Payer: Self-pay | Admitting: Women's Health

## 2020-04-25 VITALS — BP 141/81 | HR 84 | Ht 64.0 in | Wt 290.4 lb

## 2020-04-25 DIAGNOSIS — B373 Candidiasis of vulva and vagina: Secondary | ICD-10-CM | POA: Diagnosis not present

## 2020-04-25 DIAGNOSIS — B3731 Acute candidiasis of vulva and vagina: Secondary | ICD-10-CM

## 2020-04-25 DIAGNOSIS — Z3009 Encounter for other general counseling and advice on contraception: Secondary | ICD-10-CM

## 2020-04-25 DIAGNOSIS — Z3202 Encounter for pregnancy test, result negative: Secondary | ICD-10-CM

## 2020-04-25 LAB — POCT URINE PREGNANCY: Preg Test, Ur: NEGATIVE

## 2020-04-25 MED ORDER — FLUCONAZOLE 100 MG PO TABS: 100.0000 mg | ORAL_TABLET | Freq: Every day | ORAL | 0 refills | Status: DC

## 2020-04-25 NOTE — Patient Instructions (Signed)
NO SEX UNTIL AFTER YOU GET YOUR BIRTH CONTROL  

## 2020-04-25 NOTE — Progress Notes (Signed)
GYN VISIT Patient name: April Carney MRN [id number]  Date of birth: [birth year] Chief Complaint:   Vaginitis  History of Present Illness:   April Carney is a 39 y.o. G83P1102 Caucasian female being seen today for report of yeast infection x , has used every otc medicine possible w/o relief.  Is diabetic, last A1C ~37yr ago was 13.8. Wants birth control, reviewed all options, has done well w/ nexplanon in the past and doesn't recall any weight gain with it- would like to try again.      Depression screen [facility] 2/9 08/04/2019 05/24/2019 04/14/2018 03/31/2018 02/04/2018  Decreased Interest 0 0 1 0 2  Down, Depressed, Hopeless 0 0 0 1 2  PHQ - 2 Score 0 0 1 1 4   Altered sleeping - - 0 3 1  Tired, decreased energy - - 1 2 1   Change in appetite - - 1 0 1  Feeling bad or failure about yourself  - - 0 0 3  Trouble concentrating - - 0 0 1  Moving slowly or fidgety/restless - - 0 0 0  Suicidal thoughts - - 0 0 0  PHQ-9 Score - - 3 6 11   Difficult doing work/chores - - Not difficult at all Somewhat difficult Somewhat difficult  Some encounter information is confidential and restricted. Go to Review Flowsheets activity to see all data.    Patient's last menstrual period was 03/10/2020 (exact date). The current method of family planning is none.  Last pap 04/24/15. Results were:  normal Review of Systems:   Pertinent items are noted in HPI Denies fever/chills, dizziness, headaches, visual disturbances, fatigue, shortness of breath, chest pain, abdominal pain, vomiting, abnormal vaginal discharge/itching/odor/irritation, problems with periods, bowel movements, urination, or intercourse unless otherwise stated above.  Pertinent History Reviewed:  Reviewed past medical,surgical, social, obstetrical and family history.  Reviewed problem list, medications and allergies. Physical Assessment:   Vitals:   04/25/20 1103  BP: (!) 141/81  Pulse: 84  Weight: 290 lb 6.4 oz (131.7 kg)  Height: 5\' 4"   (1.626 m)  Body mass index is 49.85 kg/m.       Physical Examination:   General appearance: alert, well appearing, and in no distress  Mental status: alert, oriented to person, place, and time  Skin: warm & dry   Cardiovascular: normal heart rate noted  Respiratory: normal respiratory effort, no distress  Abdomen: soft, non-tender   Pelvic: VULVA: red, excoriated w/ fissures, c/w yeast, painted w/ gentian violet  Extremities: no edema   Chaperone: 03/12/2020    Results for orders placed or performed in visit on 04/25/20 (from the past 24 hour(s))  POCT urine pregnancy   Collection Time: 04/25/20 11:10 AM  Result Value Ref Range   Preg Test, Ur Negative Negative    Assessment & Plan:  1) Vulvovaginal candida> painted w/ gentian violet, rx diflucan x10d  2) Contraception counseling> wants nexplanon, no sex x 14d prior to insertion or be on a period, schedule today  3) Past due for pap> schedule today  Meds:  Meds ordered this encounter  Medications  . fluconazole (DIFLUCAN) 100 MG tablet    Sig: Take 1 tablet (100 mg total) by mouth daily. X 10 days    Dispense:  10 tablet    Refill:  0    Order Specific Question:   Supervising Provider    Answer:   H [[id number]]    Orders Placed This Encounter  Procedures  . POCT  urine pregnancy    Return for please schedule pap & physical, and separate appt for nexplanon insertion.  [name] CNM, [facility] 04/25/2020 11:42 AM

## 2020-05-03 ENCOUNTER — Ambulatory Visit: Payer: Medicaid Other

## 2020-05-14 ENCOUNTER — Ambulatory Visit (INDEPENDENT_AMBULATORY_CARE_PROVIDER_SITE_OTHER): Payer: Medicaid Other | Admitting: Women's Health

## 2020-05-14 ENCOUNTER — Encounter: Payer: Self-pay | Admitting: Women's Health

## 2020-05-14 ENCOUNTER — Other Ambulatory Visit (HOSPITAL_COMMUNITY)
Admission: RE | Admit: 2020-05-14 | Discharge: 2020-05-14 | Disposition: A | Discharge status: Home / Self Care | Payer: Medicaid Other | Source: Ambulatory Visit | Attending: Obstetrics & Gynecology | Admitting: Obstetrics & Gynecology

## 2020-05-14 VITALS — BP 127/86 | HR 79 | Ht 64.0 in | Wt 286.0 lb

## 2020-05-14 DIAGNOSIS — Z01419 Encounter for gynecological examination (general) (routine) without abnormal findings: Secondary | ICD-10-CM

## 2020-05-14 NOTE — Progress Notes (Signed)
WELL-WOMAN EXAMINATION Patient name: April Carney MRN [id number]  Date of birth: 1981-06-26 Chief Complaint:   Gynecologic Exam  History of Present Illness:   April Carney is a 39 y.o. G35P1102 Caucasian female being seen today for a routine well-woman exam.  Current complaints: none  Depression screen [facility] 2/9 05/14/2020 08/04/2019 05/24/2019 04/14/2018 03/31/2018  Decreased Interest 1 0 0 1 0  Down, Depressed, Hopeless 1 0 0 0 1  PHQ - 2 Score 2 0 0 1 1  Altered sleeping 1 - - 0 3  Tired, decreased energy 1 - - 1 2  Change in appetite 1 - - 1 0  Feeling bad or failure about yourself  0 - - 0 0  Trouble concentrating 0 - - 0 0  Moving slowly or fidgety/restless 0 - - 0 0  Suicidal thoughts - - - 0 0  PHQ-9 Score 5 - - 3 6  Difficult doing work/chores Not difficult at all - - Not difficult at all Somewhat difficult  Some encounter information is confidential and restricted. Go to Review Flowsheets activity to see all data.     PCP: Appt w/ Dr. Shellia Carwin in [place] on 10/26      does not desire labs Patient's last menstrual period was 03/09/2020. The current method of family planning is abstinence, has appt for nexplanon on 10/29 Last pap 2016. Results were: normal. H/O abnormal pap: no Last mammogram: never. Results were: N/A. Family h/o breast cancer: yes couple of paternal aunts Last colonoscopy: never. Results were: N/A. Family h/o colorectal cancer: yes [place] Review of Systems:   Pertinent items are noted in HPI Denies any headaches, blurred vision, fatigue, shortness of breath, chest pain, abdominal pain, abnormal vaginal discharge/itching/odor/irritation, problems with periods, bowel movements, urination, or intercourse unless otherwise stated above. Pertinent History Reviewed:  Reviewed past medical,surgical, social and family history.  Reviewed problem list, medications and allergies. Physical Assessment:   Vitals:   05/14/20 1031  BP: 127/86  Pulse: 79  Weight: 286  lb (129.7 kg)  Height: 5\' 4"  (1.626 m)  Body mass index is 49.09 kg/m.        Physical Examination by Dr.  General appearance - well appearing, and in no distress  Mental status - alert, oriented to person, place, and time  Psych:  She has a normal mood and affect  Skin - warm and dry, normal color, no suspicious lesions noted  Chest - effort normal, all lung fields clear to auscultation bilaterally  Heart - normal rate and regular rhythm  Neck:  midline trachea, no thyromegaly or nodules  Breasts - breasts appear normal, no suspicious masses, no skin or nipple changes or  axillary nodes  Abdomen - soft, nontender, nondistended, no masses or organomegaly  Pelvic - VULVA: normal appearing vulva with no masses, tenderness or lesions  VAGINA: normal appearing vagina with normal color and discharge, no lesions  CERVIX: normal appearing cervix without discharge or lesions, no CMT  Thin prep pap is done w/ HR HPV cotesting  UTERUS: uterus is felt to be normal size, shape, consistency and nontender   ADNEXA: No adnexal masses or tenderness noted.  Extremities:  No swelling or varicosities noted  Chaperone: me    No results found for this or any previous visit (from the past 24 hour(s)).  Assessment & Plan:  1) Well-Woman Exam  Labs/procedures today: pap  Mammogram @37yo  or sooner if problems Colonoscopy @37yo  or sooner if problems  No  orders of the defined types were placed in this encounter.   Meds: No orders of the defined types were placed in this encounter.   Follow-up: Return for As scheduled 10/29 for nexplanon insertion.  [name] CNM, [facility] 05/14/2020 11:01 AM

## 2020-05-17 LAB — CYTOLOGY - PAP
Chlamydia: NEGATIVE
Comment: NEGATIVE
Comment: NEGATIVE
Comment: NORMAL
Diagnosis: NEGATIVE
High risk HPV: NEGATIVE
Neisseria Gonorrhea: NEGATIVE

## 2020-05-25 ENCOUNTER — Ambulatory Visit (INDEPENDENT_AMBULATORY_CARE_PROVIDER_SITE_OTHER): Payer: Medicaid Other | Admitting: Women's Health

## 2020-05-25 ENCOUNTER — Other Ambulatory Visit: Payer: Self-pay

## 2020-05-25 ENCOUNTER — Encounter: Payer: Self-pay | Admitting: Women's Health

## 2020-05-25 VITALS — BP 131/91 | HR 88 | Ht 64.0 in | Wt 286.4 lb

## 2020-05-25 DIAGNOSIS — Z30017 Encounter for initial prescription of implantable subdermal contraceptive: Secondary | ICD-10-CM | POA: Insufficient documentation

## 2020-05-25 DIAGNOSIS — Z01812 Encounter for preprocedural laboratory examination: Secondary | ICD-10-CM | POA: Diagnosis not present

## 2020-05-25 DIAGNOSIS — Z3046 Encounter for surveillance of implantable subdermal contraceptive: Secondary | ICD-10-CM | POA: Diagnosis not present

## 2020-05-25 LAB — POCT URINE PREGNANCY: Preg Test, Ur: NEGATIVE

## 2020-05-25 MED ORDER — ETONOGESTREL 68 MG ~~LOC~~ IMPL
68.0000 mg | DRUG_IMPLANT | Freq: Once | SUBCUTANEOUS | Status: AC
  Administered 2020-05-25: 68 mg via SUBCUTANEOUS

## 2020-05-25 NOTE — Patient Instructions (Signed)
Keep the area clean and dry.  You can remove the big bandage in 24 hours, and the small steri-strip bandage in 3-5 days.  A back up method, such as condoms, should be used for two weeks. You may have irregular vaginal bleeding for the first 6 months after the Nexplanon is placed, then the bleeding usually lightens and it is possible that you may not have any periods.  If you have any concerns, please give us a call.    Etonogestrel implant What is this medicine? ETONOGESTREL (et oh noe JES trel) is a contraceptive (birth control) device. It is used to prevent pregnancy. It can be used for up to 3 years. This medicine may be used for other purposes; ask your health care provider or pharmacist if you have questions. COMMON BRAND NAME(S): Implanon, Nexplanon What should I tell my health care provider before I take this medicine? They need to know if you have any of these conditions:  abnormal vaginal bleeding  blood vessel disease or blood clots  breast, cervical, endometrial, ovarian, liver, or uterine cancer  diabetes  gallbladder disease  heart disease or recent heart attack  high blood pressure  high cholesterol or triglycerides  kidney disease  liver disease  migraine headaches  seizures  stroke  tobacco smoker  an unusual or allergic reaction to etonogestrel, anesthetics or antiseptics, other medicines, foods, dyes, or preservatives  pregnant or trying to get pregnant  breast-feeding How should I use this medicine? This device is inserted just under the skin on the inner side of your upper arm by a health care professional. Talk to your pediatrician regarding the use of this medicine in children. Special care may be needed. Overdosage: If you think you have taken too much of this medicine contact a poison control center or emergency room at once. NOTE: This medicine is only for you. Do not share this medicine with others. What if I miss a dose? This does not  apply. What may interact with this medicine? Do not take this medicine with any of the following medications:  amprenavir  fosamprenavir This medicine may also interact with the following medications:  acitretin  aprepitant  armodafinil  bexarotene  bosentan  carbamazepine  certain medicines for fungal infections like fluconazole, ketoconazole, itraconazole and voriconazole  certain medicines to treat hepatitis, HIV or AIDS  cyclosporine  felbamate  griseofulvin  lamotrigine  modafinil  oxcarbazepine  phenobarbital  phenytoin  primidone  rifabutin  rifampin  rifapentine  St. John's wort  topiramate This list may not describe all possible interactions. Give your health care provider a list of all the medicines, herbs, non-prescription drugs, or dietary supplements you use. Also tell them if you smoke, drink alcohol, or use illegal drugs. Some items may interact with your medicine. What should I watch for while using this medicine? This product does not protect you against HIV infection (AIDS) or other sexually transmitted diseases. You should be able to feel the implant by pressing your fingertips over the skin where it was inserted. Contact your doctor if you cannot feel the implant, and use a non-hormonal birth control method (such as condoms) until your doctor confirms that the implant is in place. Contact your doctor if you think that the implant may have broken or become bent while in your arm. You will receive a user card from your health care provider after the implant is inserted. The card is a record of the location of the implant in your upper arm   and when it should be removed. Keep this card with your health records. What side effects may I notice from receiving this medicine? Side effects that you should report to your doctor or health care professional as soon as possible:  allergic reactions like skin rash, itching or hives, swelling of the  face, lips, or tongue  breast lumps, breast tissue changes, or discharge  breathing problems  changes in emotions or moods  coughing up blood  if you feel that the implant may have broken or bent while in your arm  high blood pressure  pain, irritation, swelling, or bruising at the insertion site  scar at site of insertion  signs of infection at the insertion site such as fever, and skin redness, pain or discharge  signs and symptoms of a blood clot such as breathing problems; changes in vision; chest pain; severe, sudden headache; pain, swelling, warmth in the leg; trouble speaking; sudden numbness or weakness of the face, arm or leg  signs and symptoms of liver injury like dark yellow or brown urine; general ill feeling or flu-like symptoms; light-colored stools; loss of appetite; nausea; right upper belly pain; unusually weak or tired; yellowing of the eyes or skin  unusual vaginal bleeding, discharge Side effects that usually do not require medical attention (report to your doctor or health care professional if they continue or are bothersome):  acne  breast pain or tenderness  headache  irregular menstrual bleeding  nausea This list may not describe all possible side effects. Call your doctor for medical advice about side effects. You may report side effects to FDA at 1-800-FDA-1088. Where should I keep my medicine? This drug is given in a hospital or clinic and will not be stored at home. NOTE: This sheet is a summary. It may not cover all possible information. If you have questions about this medicine, talk to your doctor, pharmacist, or health care provider.  2020 Elsevier/Gold Standard (2019-04-26 11:33:04)  

## 2020-05-25 NOTE — Progress Notes (Signed)
° °  NEXPLANON INSERTION Patient name: April Carney MRN [id number]  Date of birth: 10-Sep-1980 Subjective Findings:   Lolita L Torelli is a 39 y.o. G53P1102 Caucasian female being seen today for insertion of a Nexplanon.  Patient's last menstrual period was 03/11/2020 (approximate). Last sexual intercourse was >2wks ago Last pap10/18/21. Results were:  normal  Risks/benefits/side effects of Nexplanon have been discussed and her questions have been answered.  Specifically, a failure rate of 07/998 has been reported, with an increased failure rate if pt takes St. John's Wort and/or antiseizure medicaitons.  She is aware of the common side effect of irregular bleeding, which the incidence of decreases over time. Signed copy of informed consent in chart.  Depression screen [facility] 2/9 05/14/2020 08/04/2019 05/24/2019 04/14/2018 03/31/2018  Decreased Interest 1 0 0 1 0  Down, Depressed, Hopeless 1 0 0 0 1  PHQ - 2 Score 2 0 0 1 1  Altered sleeping 1 - - 0 3  Tired, decreased energy 1 - - 1 2  Change in appetite 1 - - 1 0  Feeling bad or failure about yourself  0 - - 0 0  Trouble concentrating 0 - - 0 0  Moving slowly or fidgety/restless 0 - - 0 0  Suicidal thoughts - - - 0 0  PHQ-9 Score 5 - - 3 6  Difficult doing work/chores Not difficult at all - - Not difficult at all Somewhat difficult  Some encounter information is confidential and restricted. Go to Review Flowsheets activity to see all data.    Pertinent History Reviewed:   Reviewed past medical,surgical, social, obstetrical and family history.  Reviewed problem list, medications and allergies. Objective Findings & Procedure:    Vitals:   05/25/20 0845  BP: (!) 131/91  Pulse: 88  Weight: 286 lb 6.4 oz (129.9 kg)  Height: 5\' 4"  (1.626 m)  Body mass index is 49.16 kg/m.  Results for orders placed or performed in visit on 05/25/20 (from the past 24 hour(s))  POCT urine pregnancy   Collection Time: 05/25/20  8:48 AM  Result Value Ref  Range   Preg Test, Ur Negative Negative     Time out was performed.  She is right-handed, so her left arm, approximately 10cm from the medial epicondyle and 3-5cm posterior to the sulcus, was cleansed with alcohol and anesthetized with 2cc of 2% Lidocaine.  The area was cleansed again with betadine and the Nexplanon was inserted per manufacturer's recommendations without difficulty.  3 steri-strips and pressure bandage were applied. The patient tolerated the procedure well.  Assessment & Plan:   1) Nexplanon insertion Pt was instructed to keep the area clean and dry, remove pressure bandage in 24 hours, and keep insertion site covered with the steri-strip for 3-5 days.  Back up contraception was recommended for 2 weeks.  She was given a card indicating date Nexplanon was inserted and date it needs to be removed. Follow-up PRN problems.  Orders Placed This Encounter  Procedures   POCT urine pregnancy    Follow-up: Return in about 1 year (around 05/25/2021) for Physical.  05/27/2021 CNM, [facility] 05/25/2020 9:07 AM

## 2020-06-08 DIAGNOSIS — E119 Type 2 diabetes mellitus without complications: Secondary | ICD-10-CM | POA: Diagnosis not present

## 2020-06-08 DIAGNOSIS — E039 Hypothyroidism, unspecified: Secondary | ICD-10-CM | POA: Diagnosis not present

## 2020-06-08 DIAGNOSIS — Z760 Encounter for issue of repeat prescription: Secondary | ICD-10-CM | POA: Diagnosis not present

## 2020-06-08 DIAGNOSIS — F32A Depression, unspecified: Secondary | ICD-10-CM | POA: Diagnosis not present

## 2020-06-08 DIAGNOSIS — D649 Anemia, unspecified: Secondary | ICD-10-CM | POA: Diagnosis not present

## 2020-06-08 DIAGNOSIS — I10 Essential (primary) hypertension: Secondary | ICD-10-CM | POA: Diagnosis not present

## 2020-06-08 DIAGNOSIS — E785 Hyperlipidemia, unspecified: Secondary | ICD-10-CM | POA: Diagnosis not present

## 2020-07-09 ENCOUNTER — Other Ambulatory Visit: Payer: Self-pay

## 2020-07-09 ENCOUNTER — Emergency Department (HOSPITAL_COMMUNITY): Payer: Medicaid Other

## 2020-07-09 ENCOUNTER — Encounter (HOSPITAL_COMMUNITY): Payer: Self-pay | Admitting: Emergency Medicine

## 2020-07-09 ENCOUNTER — Inpatient Hospital Stay (HOSPITAL_COMMUNITY)
Admission: EM | Admit: 2020-07-09 | Discharge: 2020-07-16 | DRG: 061 | Disposition: A | Discharge status: Home / Self Care | Payer: Medicaid Other | Attending: Internal Medicine | Admitting: Internal Medicine

## 2020-07-09 DIAGNOSIS — F4310 Post-traumatic stress disorder, unspecified: Secondary | ICD-10-CM | POA: Diagnosis not present

## 2020-07-09 DIAGNOSIS — E1165 Type 2 diabetes mellitus with hyperglycemia: Secondary | ICD-10-CM | POA: Diagnosis not present

## 2020-07-09 DIAGNOSIS — Z833 Family history of diabetes mellitus: Secondary | ICD-10-CM

## 2020-07-09 DIAGNOSIS — E785 Hyperlipidemia, unspecified: Secondary | ICD-10-CM | POA: Diagnosis present

## 2020-07-09 DIAGNOSIS — Z825 Family history of asthma and other chronic lower respiratory diseases: Secondary | ICD-10-CM

## 2020-07-09 DIAGNOSIS — I2609 Other pulmonary embolism with acute cor pulmonale: Secondary | ICD-10-CM | POA: Diagnosis not present

## 2020-07-09 DIAGNOSIS — Z83438 Family history of other disorder of lipoprotein metabolism and other lipidemia: Secondary | ICD-10-CM

## 2020-07-09 DIAGNOSIS — I2699 Other pulmonary embolism without acute cor pulmonale: Secondary | ICD-10-CM | POA: Diagnosis present

## 2020-07-09 DIAGNOSIS — I6389 Other cerebral infarction: Secondary | ICD-10-CM | POA: Diagnosis not present

## 2020-07-09 DIAGNOSIS — J1282 Pneumonia due to coronavirus disease 2019: Secondary | ICD-10-CM | POA: Diagnosis not present

## 2020-07-09 DIAGNOSIS — Z823 Family history of stroke: Secondary | ICD-10-CM

## 2020-07-09 DIAGNOSIS — Z7984 Long term (current) use of oral hypoglycemic drugs: Secondary | ICD-10-CM

## 2020-07-09 DIAGNOSIS — R000 Tachycardia, unspecified: Secondary | ICD-10-CM | POA: Diagnosis not present

## 2020-07-09 DIAGNOSIS — R4781 Slurred speech: Secondary | ICD-10-CM | POA: Diagnosis not present

## 2020-07-09 DIAGNOSIS — I639 Cerebral infarction, unspecified: Secondary | ICD-10-CM

## 2020-07-09 DIAGNOSIS — E7800 Pure hypercholesterolemia, unspecified: Secondary | ICD-10-CM | POA: Diagnosis present

## 2020-07-09 DIAGNOSIS — Z6841 Body Mass Index (BMI) 40.0 and over, adult: Secondary | ICD-10-CM

## 2020-07-09 DIAGNOSIS — U071 COVID-19: Secondary | ICD-10-CM | POA: Diagnosis not present

## 2020-07-09 DIAGNOSIS — E039 Hypothyroidism, unspecified: Secondary | ICD-10-CM | POA: Diagnosis not present

## 2020-07-09 DIAGNOSIS — I63531 Cerebral infarction due to unspecified occlusion or stenosis of right posterior cerebral artery: Secondary | ICD-10-CM | POA: Diagnosis not present

## 2020-07-09 DIAGNOSIS — D509 Iron deficiency anemia, unspecified: Secondary | ICD-10-CM | POA: Diagnosis not present

## 2020-07-09 DIAGNOSIS — Z841 Family history of disorders of kidney and ureter: Secondary | ICD-10-CM

## 2020-07-09 DIAGNOSIS — I10 Essential (primary) hypertension: Secondary | ICD-10-CM | POA: Diagnosis not present

## 2020-07-09 DIAGNOSIS — R29701 NIHSS score 1: Secondary | ICD-10-CM | POA: Diagnosis not present

## 2020-07-09 DIAGNOSIS — G8194 Hemiplegia, unspecified affecting left nondominant side: Secondary | ICD-10-CM | POA: Diagnosis present

## 2020-07-09 DIAGNOSIS — I63511 Cerebral infarction due to unspecified occlusion or stenosis of right middle cerebral artery: Principal | ICD-10-CM | POA: Diagnosis present

## 2020-07-09 DIAGNOSIS — N390 Urinary tract infection, site not specified: Secondary | ICD-10-CM | POA: Diagnosis present

## 2020-07-09 DIAGNOSIS — R29898 Other symptoms and signs involving the musculoskeletal system: Secondary | ICD-10-CM | POA: Diagnosis not present

## 2020-07-09 DIAGNOSIS — I63411 Cerebral infarction due to embolism of right middle cerebral artery: Secondary | ICD-10-CM

## 2020-07-09 DIAGNOSIS — N920 Excessive and frequent menstruation with regular cycle: Secondary | ICD-10-CM | POA: Diagnosis present

## 2020-07-09 DIAGNOSIS — E669 Obesity, unspecified: Secondary | ICD-10-CM | POA: Diagnosis present

## 2020-07-09 DIAGNOSIS — Z8673 Personal history of transient ischemic attack (TIA), and cerebral infarction without residual deficits: Secondary | ICD-10-CM

## 2020-07-09 DIAGNOSIS — Z8249 Family history of ischemic heart disease and other diseases of the circulatory system: Secondary | ICD-10-CM | POA: Diagnosis not present

## 2020-07-09 DIAGNOSIS — D62 Acute posthemorrhagic anemia: Secondary | ICD-10-CM | POA: Diagnosis not present

## 2020-07-09 DIAGNOSIS — F32A Depression, unspecified: Secondary | ICD-10-CM | POA: Diagnosis present

## 2020-07-09 DIAGNOSIS — Z79899 Other long term (current) drug therapy: Secondary | ICD-10-CM

## 2020-07-09 DIAGNOSIS — R9431 Abnormal electrocardiogram [ECG] [EKG]: Secondary | ICD-10-CM | POA: Diagnosis not present

## 2020-07-09 DIAGNOSIS — Z79890 Hormone replacement therapy (postmenopausal): Secondary | ICD-10-CM

## 2020-07-09 DIAGNOSIS — Z86711 Personal history of pulmonary embolism: Secondary | ICD-10-CM

## 2020-07-09 HISTORY — DX: Cerebral infarction, unspecified: I63.9

## 2020-07-09 HISTORY — DX: Personal history of transient ischemic attack (TIA), and cerebral infarction without residual deficits: Z86.73

## 2020-07-09 HISTORY — DX: Personal history of pulmonary embolism: Z86.711

## 2020-07-09 LAB — DIFFERENTIAL
Abs Immature Granulocytes: 0.42 10*3/uL — ABNORMAL HIGH (ref 0.00–0.07)
Basophils Absolute: 0 10*3/uL (ref 0.0–0.1)
Basophils Relative: 0 %
Eosinophils Absolute: 0.3 10*3/uL (ref 0.0–0.5)
Eosinophils Relative: 2 %
Immature Granulocytes: 3 %
Lymphocytes Relative: 17 %
Lymphs Abs: 2.5 10*3/uL (ref 0.7–4.0)
Monocytes Absolute: 0.9 10*3/uL (ref 0.1–1.0)
Monocytes Relative: 6 %
Neutro Abs: 10.7 10*3/uL — ABNORMAL HIGH (ref 1.7–7.7)
Neutrophils Relative %: 72 %

## 2020-07-09 LAB — COMPREHENSIVE METABOLIC PANEL
ALT: 42 U/L (ref 0–44)
AST: 38 U/L (ref 15–41)
Albumin: 3.9 g/dL (ref 3.5–5.0)
Alkaline Phosphatase: 65 U/L (ref 38–126)
Anion gap: 15 (ref 5–15)
BUN: 8 mg/dL (ref 6–20)
CO2: 22 mmol/L (ref 22–32)
Calcium: 9.3 mg/dL (ref 8.9–10.3)
Chloride: 96 mmol/L — ABNORMAL LOW (ref 98–111)
Creatinine, Ser: 0.81 mg/dL (ref 0.44–1.00)
GFR, Estimated: >60 mL/min (ref >60)
Glucose, Bld: 151 mg/dL — ABNORMAL HIGH (ref 70–99)
Potassium: 3.2 mmol/L — ABNORMAL LOW (ref 3.5–5.1)
Sodium: 133 mmol/L — ABNORMAL LOW (ref 135–145)
Total Bilirubin: 1.2 mg/dL (ref 0.3–1.2)
Total Protein: 7.9 g/dL (ref 6.5–8.1)

## 2020-07-09 LAB — RESP PANEL BY RT-PCR (FLU A&B, COVID) ARPGX2
Influenza A by PCR: NEGATIVE
Influenza B by PCR: NEGATIVE
SARS Coronavirus 2 by RT PCR: POSITIVE — AB

## 2020-07-09 LAB — CBC
HCT: 25.5 % — ABNORMAL LOW (ref 36.0–46.0)
Hemoglobin: 7.4 g/dL — ABNORMAL LOW (ref 12.0–15.0)
MCH: 23.1 pg — ABNORMAL LOW (ref 26.0–34.0)
MCHC: 29 g/dL — ABNORMAL LOW (ref 30.0–36.0)
MCV: 79.7 fL — ABNORMAL LOW (ref 80.0–100.0)
Platelets: 841 10*3/uL — ABNORMAL HIGH (ref 150–400)
RBC: 3.2 MIL/uL — ABNORMAL LOW (ref 3.87–5.11)
RDW: 15.4 % (ref 11.5–15.5)
WBC: 14.9 10*3/uL — ABNORMAL HIGH (ref 4.0–10.5)
nRBC: 0.5 % — ABNORMAL HIGH (ref 0.0–0.2)

## 2020-07-09 LAB — ETHANOL: Alcohol, Ethyl (B): <10 mg/dL (ref <10)

## 2020-07-09 LAB — PROTIME-INR
INR: 1.1 (ref 0.8–1.2)
Prothrombin Time: 14 seconds (ref 11.4–15.2)

## 2020-07-09 LAB — APTT: aPTT: 24 seconds (ref 24–36)

## 2020-07-09 LAB — CBG MONITORING, ED: Glucose-Capillary: 153 mg/dL — ABNORMAL HIGH (ref 70–99)

## 2020-07-09 MED ORDER — ALTEPLASE (STROKE) FULL DOSE INFUSION: 90.0000 mg | Freq: Once | INTRAVENOUS | Status: AC

## 2020-07-09 MED ORDER — LABETALOL HCL 5 MG/ML IV SOLN
INTRAVENOUS | Status: AC
  Filled 2020-07-09: qty 4

## 2020-07-09 MED ORDER — ALTEPLASE 100 MG IV SOLR
INTRAVENOUS | Status: AC
  Administered 2020-07-09: 20:00:00 90 mg via INTRAVENOUS
  Filled 2020-07-09: qty 100

## 2020-07-09 MED ORDER — LABETALOL HCL 5 MG/ML IV SOLN: 20.0000 mg | Freq: Once | INTRAVENOUS | Status: DC

## 2020-07-09 MED ORDER — SODIUM CHLORIDE 0.9 % IV SOLN
50.0000 mL | Freq: Once | INTRAVENOUS | Status: AC
  Administered 2020-07-09: 21:00:00 50 mL via INTRAVENOUS

## 2020-07-09 MED ORDER — CLEVIDIPINE BUTYRATE 0.5 MG/ML IV EMUL: 0.0000 mg/h | INTRAVENOUS | Status: DC

## 2020-07-09 NOTE — ED Provider Notes (Signed)
[facility] EMERGENCY DEPARTMENT Provider Note   CSN: [id number] Arrival date & time: 07/09/20  1914  An emergency department physician performed an initial assessment on this suspected stroke patient at 62.  History Chief Complaint  Patient presents with   Code Stroke    April Carney is a 39 y.o. female.  Patient states that 630 she knows she had some slurred speech and weakness in her left arm.  The speech has improved but she is now having still weakness in that left arm with some tingling.  Patient has history of diabetes hypertension and elevated cholesterol  The history is provided by the patient and medical records. No language interpreter was used.  Weakness Severity:  Mild Onset quality:  Sudden Timing:  Constant Progression:  Waxing and waning Chronicity:  New Context: alcohol use   Relieved by:  Nothing Worsened by:  Nothing Ineffective treatments:  None tried Associated symptoms: no abdominal pain, no chest pain, no cough, no diarrhea, no frequency, no headaches and no seizures        Past Medical History:  Diagnosis Date   Depression    Diabetes mellitus without complication ([place])    Hyperlipidemia    Hypertension    Hypoactive thyroid    Hypothyroid    Mental disorder    depression and PTSD    Patient Active Problem List   Diagnosis Date Noted   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   CVA (cerebral vascular accident) ([place]) 07/09/2020   Nexplanon insertion 05/25/2020   Anemia 08/22/2019   Elevated liver function tests 06/02/2017   Superficial fungus infection of skin 07/01/2016   Essential hypertension 07/01/2016   Diabetes mellitus ([place]) 12/12/2015   Hyperlipidemia 12/12/2015   Situational depression 12/11/2015   Family h/o Biliary atresia 04/24/2015   History of preterm delivery 04/24/2015   History of cesarean section 04/24/2015   Obesity 04/24/2015   Hypothyroidism 04/24/2015    Past Surgical History:   Procedure Laterality Date   CESAREAN SECTION       OB History    Gravida  2   Para  2   Term  1   Preterm  1   AB      Living  2     SAB      IAB      Ectopic      Multiple      Live Births  2           Family History  Problem Relation Age of Onset   Diabetes Mother    Hypertension Mother    Hyperlipidemia Mother    Kidney disease Mother    Stroke Mother    Diabetes Father    Heart disease Father    Liver disease Son        biliary atresia    Asthma Son    ADD / ADHD Son    ODD Son    Asthma Son    ADD / ADHD Son    ODD Son    Cancer Paternal Aunt    Colon cancer Neg Hx     Social History   Tobacco Use   Smoking status: Never Smoker   Smokeless tobacco: Never Used  Scientific laboratory technician Use: Never used  Substance Use Topics   Alcohol use: Yes    Comment: "very rarely"   Drug use: No    Home Medications Prior to Admission medications   Medication Sig Start Date End Date Taking? Authorizing  Provider  Accu-Chek FastClix Lancets MISC Use to check blood glucose twice a day 01/02/20   Maryruth Hancock, MD  acetaminophen (TYLENOL) 325 MG tablet Take 650 mg by mouth every 6 (six) hours as needed.     [provider]  atorvastatin (LIPITOR) 20 MG tablet Take 1 tablet (20 mg total) by mouth daily. 05/01/17   [name], MD  blood glucose meter kit and supplies KIT Dispense based on patient and insurance preference. Use up to four times daily as directed. (FOR ICD-9 250.00, 250.01). 05/31/19   Corum, Rex Kras, MD  cetirizine (ZYRTEC) 10 MG tablet Take 10 mg by mouth daily.    [provider]  diphenhydrAMINE (BENADRYL) 25 MG tablet Take 25 mg by mouth every 6 (six) hours as needed.     [provider]  FLUoxetine (PROZAC) 20 MG capsule Take three pills each morning as directed. 04/14/18   [name], MD  glucose blood (ACCU-CHEK GUIDE) test strip Use to check glucose twice a day 01/02/20   Maryruth Hancock, MD   hydrochlorothiazide (MICROZIDE) 12.5 MG capsule Take 1 capsule (12.5 mg total) by mouth daily. 01/02/20   Corum, Rex Kras, MD  ibuprofen (ADVIL) 200 MG tablet Take 400 mg by mouth every 6 (six) hours as needed.     [provider]  levothyroxine (SYNTHROID) 150 MCG tablet Take 1 tablet (150 mcg total) by mouth daily. 07/15/19   Corum, Rex Kras, MD  metFORMIN (GLUCOPHAGE) 500 MG tablet TAKE 1 TABLET BY MOUTH WITH BREAKFAST AND 2 TABLETS WITH EVENING MEAL 01/02/20   Corum, Rex Kras, MD  traZODone (DESYREL) 100 MG tablet Take 100 mg by mouth at bedtime as needed.     [provider]    Allergies    Patient has no known allergies.  Review of Systems   Review of Systems  Constitutional: Negative for appetite change and fatigue.  HENT: Negative for congestion, ear discharge and sinus pressure.   Eyes: Negative for discharge.  Respiratory: Negative for cough.   Cardiovascular: Negative for chest pain.  Gastrointestinal: Negative for abdominal pain and diarrhea.  Genitourinary: Negative for frequency and hematuria.  Musculoskeletal: Negative for back pain.  Skin: Negative for rash.  Neurological: Positive for weakness. Negative for seizures and headaches.  Psychiatric/Behavioral: Negative for hallucinations.    Physical Exam Updated Vital Signs BP 112/72    Pulse 85    Temp 98.6 F (37 C)    Resp 18    Ht $R'5\' 4"'vl$  (1.626 m)    Wt 120.4 kg    SpO2 100%    BMI 45.56 kg/m   Physical Exam Vitals and nursing note reviewed.  Constitutional:      Appearance: She is well-developed.  HENT:     Head: Normocephalic.     Nose: Nose normal.  Eyes:     General: No scleral icterus.    Extraocular Movements: EOM normal.     Conjunctiva/sclera: Conjunctivae normal.  Neck:     Thyroid: No thyromegaly.  Cardiovascular:     Rate and Rhythm: Normal rate and regular rhythm.     Heart sounds: No murmur heard. No friction rub. No gallop.   Pulmonary:     Breath sounds: No stridor. No wheezing  or rales.  Chest:     Chest wall: No tenderness.  Abdominal:     General: There is no distension.     Tenderness: There is no abdominal tenderness. There is no rebound.  Musculoskeletal:  General: No edema. Normal range of motion.     Cervical back: Neck supple.     Comments: Weakness in left arm and left hand  Lymphadenopathy:     Cervical: No cervical adenopathy.  Skin:    Findings: No erythema or rash.  Neurological:     Mental Status: She is alert and oriented to person, place, and time.     Motor: No abnormal muscle tone.     Coordination: Coordination normal.  Psychiatric:        Mood and Affect: Mood and affect normal.        Behavior: Behavior normal.     ED Results / Procedures / Treatments   Labs (all labs ordered are listed, but only abnormal results are displayed) Labs Reviewed  CBC - Abnormal; Notable for the following components:      Result Value   WBC 14.9 (*)    RBC 3.20 (*)    Hemoglobin 7.4 (*)    HCT 25.5 (*)    MCV 79.7 (*)    MCH 23.1 (*)    MCHC 29.0 (*)    Platelets 841 (*)    nRBC 0.5 (*)    All other components within normal limits  DIFFERENTIAL - Abnormal; Notable for the following components:   Neutro Abs 10.7 (*)    Abs Immature Granulocytes 0.42 (*)    All other components within normal limits  COMPREHENSIVE METABOLIC PANEL - Abnormal; Notable for the following components:   Sodium 133 (*)    Potassium 3.2 (*)    Chloride 96 (*)    Glucose, Bld 151 (*)    All other components within normal limits  CBG MONITORING, ED - Abnormal; Notable for the following components:   Glucose-Capillary 153 (*)    All other components within normal limits  RESP PANEL BY RT-PCR (FLU A&B, COVID) ARPGX2  ETHANOL  PROTIME-INR  APTT  RAPID URINE DRUG SCREEN, HOSP PERFORMED  URINALYSIS, ROUTINE W REFLEX MICROSCOPIC  POC URINE PREG, ED    EKG None  Radiology CT HEAD CODE STROKE WO CONTRAST  Result Date: 07/09/2020 CLINICAL DATA:  Code  stroke.  Left-sided weakness EXAM: CT HEAD WITHOUT CONTRAST TECHNIQUE: Contiguous axial images were obtained from the base of the skull through the vertex without intravenous contrast. COMPARISON:  None. FINDINGS: Brain: There is no acute intracranial hemorrhage, mass effect, or edema. Gray-white differentiation is preserved. Ventricles and sulci are normal in size and configuration. No extra-axial collection. Vascular: No hyperdense vessel. Skull: Unremarkable. Sinuses/Orbits: Diffuse paranasal sinus mucosal thickening. Orbits are unremarkable. Other: Mastoid air cells are clear. ASPECTS ([place] Stroke Program Early CT Score) - Ganglionic level infarction (caudate, lentiform nuclei, internal capsule, insula, M1-M3 cortex): 7 - Supraganglionic infarction (M4-M6 cortex): 3 Total score (0-10 with 10 being normal): 10 IMPRESSION: There is no acute intracranial hemorrhage or evidence of acute infarction. ASPECT score is 10. These results were called by telephone at the time of interpretation on 07/09/2020 at 7:50 pm to provider [facility] Sina Sumpter , who verbally acknowledged these results. Electronically Signed   By: Guadlupe Spanish M.D.   On: 07/09/2020 19:52    Procedures Procedures (including critical care time)  Medications Ordered in ED Medications  labetalol (NORMODYNE) injection 20 mg (20 mg Intravenous Not Given 07/09/20 2023)    And  clevidipine (CLEVIPREX) infusion 0.5 mg/mL (0 mg/hr Intravenous Not Given 07/09/20 2023)  alteplase (ACTIVASE) 1 mg/mL infusion 90 mg (0 mg Intravenous Stopped 07/09/20 2103)    Followed by  0.9 %  sodium chloride infusion (0 mLs Intravenous Stopped 07/09/20 2119)    ED Course  I have reviewed the triage vital signs and the nursing notes.  Pertinent labs & imaging results that were available during my care of the patient were reviewed by me and considered in my medical decision making (see chart for details).    CRITICAL CARE Performed by: Milton Ferguson Total  critical care time: 40 minutes Critical care time was exclusive of separately billable procedures and treating other patients. Critical care was necessary to treat or prevent imminent or life-threatening deterioration. Critical care was time spent personally by me on the following activities: development of treatment plan with patient and/or surrogate as well as nursing, discussions with consultants, evaluation of patient's response to treatment, examination of patient, obtaining history from patient or surrogate, ordering and performing treatments and interventions, ordering and review of laboratory studies, ordering and review of radiographic studies, pulse oximetry and re-evaluation of patient's condition. Patient was seen by neurology and TPA was started. MDM Rules/Calculators/A&P                          Patient with a stroke that has improved some.  She will be admitted to neurology and [facility].  Dr. [name] is excepting MD Final Clinical Impression(s) / ED Diagnoses Final diagnoses:  None    Rx / DC Orders ED Discharge Orders    None       Milton Ferguson, MD 07/09/20 2149

## 2020-07-09 NOTE — ED Triage Notes (Signed)
Pt states left arm went numb with slurred speech at 1830. Pt's speech is clear now, but c/o headache.

## 2020-07-09 NOTE — ED Notes (Signed)
Date and time results received: 07/09/20 2154 (use smartphrase ".now" to insert current time)  Test: Covid Critical Value: positive  Name of Provider Notified: Otelia Limes, MD  Orders Received? Or Actions Taken?:

## 2020-07-09 NOTE — Consult Note (Signed)
[facility] Telemedicine Consult   Requesting Provider: Dr Roderic Palau Consult Participants: Dr. Jerelyn Charles, Telespecialist RN - Amy M   Bedside RN [place] Location of the provider: Home Location of the patient: [facility]  This consult was provided via telemedicine with 2-way video and audio communication. The patient/family was informed that care would be provided in this way and agreed to receive care in this manner.    Chief Complaint: left sided numbness  HPI: 39/F with PMH of DM, HTN, HLD, recent COVID +ve on 06/27/2020, presented to ER at [facility] with LKW of 1800hr and sudden onset of left sided numbness, weakness and slurrd speech. No history of prior stroke. A1c in 11 range last check per patient. Family history of stroke in mother in her 31s.  Symptoms rapidly improving while in ER. Complains of mild headache. Not a migrainer and no history of headaches in the past. Denies fevers but has been feeling rundown due to COVID.   Past Medical History:  Diagnosis Date  . Depression   . Diabetes mellitus without complication ([place])   . Hyperlipidemia   . Hypertension   . Hypoactive thyroid   . Hypothyroid   . Mental disorder    depression and PTSD     Current Facility-Administered Medications:  .  alteplase (ACTIVASE) 1 mg/mL infusion 90 mg, 90 mg, Intravenous, Once **FOLLOWED BY** 0.9 %  sodium chloride infusion, 50 mL, Intravenous, Once, Amie Portland, MD .  labetalol (NORMODYNE) injection 20 mg, 20 mg, Intravenous, Once **AND** clevidipine (CLEVIPREX) infusion 0.5 mg/mL, 0-21 mg/hr, Intravenous, Continuous, Amie Portland, MD  Current Outpatient Medications:  .  Accu-Chek FastClix Lancets MISC, Use to check blood glucose twice a day, Disp: 60 each, Rfl: 0 .  acetaminophen (TYLENOL) 325 MG tablet, Take 650 mg by mouth every 6 (six) hours as needed. , Disp: , Rfl:  .  atorvastatin (LIPITOR) 20 MG tablet, Take 1 tablet (20 mg total) by mouth daily., Disp: 90 tablet, Rfl: 3 .  blood  glucose meter kit and supplies KIT, Dispense based on patient and insurance preference. Use up to four times daily as directed. (FOR ICD-9 250.00, 250.01)., Disp: 1 each, Rfl: 0 .  cetirizine (ZYRTEC) 10 MG tablet, Take 10 mg by mouth daily., Disp: , Rfl:  .  diphenhydrAMINE (BENADRYL) 25 MG tablet, Take 25 mg by mouth every 6 (six) hours as needed. , Disp: , Rfl:  .  FLUoxetine (PROZAC) 20 MG capsule, Take three pills each morning as directed., Disp: 90 capsule, Rfl: 3 .  glucose blood (ACCU-CHEK GUIDE) test strip, Use to check glucose twice a day, Disp: 60 each, Rfl: 0 .  hydrochlorothiazide (MICROZIDE) 12.5 MG capsule, Take 1 capsule (12.5 mg total) by mouth daily., Disp: 30 capsule, Rfl: 0 .  ibuprofen (ADVIL) 200 MG tablet, Take 400 mg by mouth every 6 (six) hours as needed. , Disp: , Rfl:  .  levothyroxine (SYNTHROID) 150 MCG tablet, Take 1 tablet (150 mcg total) by mouth daily., Disp: 30 tablet, Rfl: 11 .  metFORMIN (GLUCOPHAGE) 500 MG tablet, TAKE 1 TABLET BY MOUTH WITH BREAKFAST AND 2 TABLETS WITH EVENING MEAL, Disp: 90 tablet, Rfl: 0 .  traZODone (DESYREL) 100 MG tablet, Take 100 mg by mouth at bedtime as needed. , Disp: , Rfl:     LKW: 1830 hrs tpa given?: Yes, bolus _0  hrs IR Thrombectomy? No-exam not consistent with LVO Time of teleneurologist evaluation: 1946 hrs  Exam: Vitals:   07/09/20 1933  BP: 129/88  Pulse: (!) 126  Resp: 18  Temp: 98.9 F (37.2 C)  SpO2: 99%    General: AAOx3 No aphasia or dysarthria No drift. Weak left had grip Somewhat diminished sensation on left which was rapidly improving. Slow fine movements on left hand in comparison to the right. No FNF or HKS dysmetria.  NIHSS 1A: Level of Consciousness - 0 1B: Ask Month and Age - 0 1C: 'Blink Eyes' & 'Squeeze Hands' - 0 2: Test Horizontal Extraocular Movements - 0 3: Test Visual Fields - 0 4: Test Facial Palsy - 0 5A: Test Left Arm Motor Drift - 0 5B: Test Right Arm Motor Drift - 0 6A:  Test Left Leg Motor Drift - 0 6B: Test Right Leg Motor Drift - 0 7: Test Limb Ataxia - 0 8: Test Sensation - 1 9: Test Language/Aphasia- 0 10: Test Dysarthria - 0 11: Test Extinction/Inattention - 0 NIHSS score: 1   Imaging Reviewed: CTH -no bleed. ASPECTS 10  Labs reviewed in epic and pertinent values follow: CBC    Component Value Date/Time   WBC 6.8 07/14/2019 1443   RBC 4.21 07/14/2019 1443   HGB 10.1 (L) 07/14/2019 1443   HGB 10.4 (L) 05/02/2019 0956   HCT 32.5 (L) 07/14/2019 1443   HCT 35.5 05/02/2019 0956   PLT 462 (H) 07/14/2019 1443   PLT 403 05/02/2019 0956   MCV 77.2 (L) 07/14/2019 1443   MCV 83 05/02/2019 0956   MCH 24.0 (L) 07/14/2019 1443   MCHC 31.1 (L) 07/14/2019 1443   RDW 14.2 07/14/2019 1443   RDW 12.7 05/02/2019 0956   LYMPHSABS 2,774 07/14/2019 1443   MONOABS 0.4 12/23/2013 1135   EOSABS 333 07/14/2019 1443   BASOSABS 48 07/14/2019 1443   CMP     Component Value Date/Time   NA 138 07/14/2019 1443   NA 139 01/09/2016 0930   K 4.6 07/14/2019 1443   CL 98 07/14/2019 1443   CO2 26 07/14/2019 1443   GLUCOSE 182 (H) 07/14/2019 1443   BUN 7 07/14/2019 1443   BUN 8 01/09/2016 0930   CREATININE 0.65 07/14/2019 1443   CALCIUM 9.9 07/14/2019 1443   PROT 7.3 08/23/2019 0916   PROT 6.7 01/09/2016 0930   ALBUMIN 4.3 03/26/2017 1037   ALBUMIN 4.2 01/09/2016 0930   AST 84 (H) 08/23/2019 0916   ALT 121 (H) 08/23/2019 0916   ALKPHOS 48 03/26/2017 1037   BILITOT 0.6 08/23/2019 0916   BILITOT 0.4 01/09/2016 0930   GFRNONAA 114 07/14/2019 1443   GFRAA 132 07/14/2019 1443     Assessment:  39/F with sudden onset of left sided weakness, slurred speech, with symptoms rapidly improving.  She is a poorly controlled diabetic with last A1c 11 On exam continued to have left hadn weakness without vertical drift and sensory deficits on left, which were now mild. Had a detailed discussion with her about this being a possibly stroke and treating with IV tPA,  inspite of low NIHSS, this being disabling (unable to use left hand properly). She agreed to proceed with tPA, which was given at [facility] (see timing above)  IMPRESSION -Acute ischemic stroke (small vessel etiology) - s/p IV tPA -Recent COVID 19 infection (06/27/2020) - not vaccinated -DM -HTN -HLD -Obesity  Recommendations:  -IV tPA for possible small vessel stroke. -Post tPA orderset vitals and neurochecks -Discussed with Dr. Lindzen ([facility] Neurologist) and Dr. Zammit (EDP at [facility]) Pt will be accepted by Dr. Lindzen for NICU admission at [facility].  Will possibly   need PCCM consult for COVID.   This patient is receiving care for possible acute neurological changes. There was 45 minutes of care by this provider at the time of service, including time for direct evaluation via telemedicine, review of medical records, imaging studies and discussion of findings with providers, the patient and/or family.  -- Ashish Arora, MD [facility]  CRITICAL CARE ATTESTATION Performed by: Ashish Arora, MD Total critical care time: 45 minutes Critical care time was exclusive of separately billable procedures and treating other patients and/or supervising [facility]Ps/Residents/Students Critical care was necessary to treat or prevent imminent or life-threatening deterioration due to  Acute ischemic stroke and IV thrombolysis. This patient is critically ill and at significant risk for neurological worsening and/or death and care requires constant monitoring. Critical care was time spent personally by me on the following activities: development of treatment plan with patient and/or surrogate as well as nursing, discussions with consultants, evaluation of patient's response to treatment, examination of patient, obtaining history from patient or surrogate, ordering and performing treatments and interventions, ordering and review of laboratory studies, ordering and review of radiographic studies, pulse oximetry,  re-evaluation of patient's condition, participation in multidisciplinary rounds and medical decision making of high complexity in the care of this patient.  

## 2020-07-09 NOTE — ED Notes (Signed)
[facility] paged @ [phone]

## 2020-07-09 NOTE — Progress Notes (Signed)
CODE STROKE DOCUMENTATION CALL TIME 19:36 EXAM STARTED 1937 EXAM FINISHED 1939 IMAGES SENT TO SOC 1940 EXAM COMPLETED IN Epic 1940 GRA CALLED [phone]

## 2020-07-10 DIAGNOSIS — N920 Excessive and frequent menstruation with regular cycle: Secondary | ICD-10-CM | POA: Diagnosis not present

## 2020-07-10 DIAGNOSIS — R4781 Slurred speech: Secondary | ICD-10-CM | POA: Diagnosis not present

## 2020-07-10 DIAGNOSIS — I639 Cerebral infarction, unspecified: Secondary | ICD-10-CM | POA: Diagnosis not present

## 2020-07-10 DIAGNOSIS — Z83438 Family history of other disorder of lipoprotein metabolism and other lipidemia: Secondary | ICD-10-CM | POA: Diagnosis not present

## 2020-07-10 DIAGNOSIS — G8194 Hemiplegia, unspecified affecting left nondominant side: Secondary | ICD-10-CM | POA: Diagnosis not present

## 2020-07-10 DIAGNOSIS — Z8249 Family history of ischemic heart disease and other diseases of the circulatory system: Secondary | ICD-10-CM | POA: Diagnosis not present

## 2020-07-10 DIAGNOSIS — I63511 Cerebral infarction due to unspecified occlusion or stenosis of right middle cerebral artery: Secondary | ICD-10-CM | POA: Diagnosis not present

## 2020-07-10 DIAGNOSIS — I10 Essential (primary) hypertension: Secondary | ICD-10-CM | POA: Diagnosis not present

## 2020-07-10 DIAGNOSIS — N390 Urinary tract infection, site not specified: Secondary | ICD-10-CM | POA: Diagnosis not present

## 2020-07-10 DIAGNOSIS — E669 Obesity, unspecified: Secondary | ICD-10-CM | POA: Diagnosis not present

## 2020-07-10 DIAGNOSIS — E785 Hyperlipidemia, unspecified: Secondary | ICD-10-CM | POA: Diagnosis not present

## 2020-07-10 DIAGNOSIS — Z833 Family history of diabetes mellitus: Secondary | ICD-10-CM | POA: Diagnosis not present

## 2020-07-10 DIAGNOSIS — E039 Hypothyroidism, unspecified: Secondary | ICD-10-CM | POA: Diagnosis not present

## 2020-07-10 DIAGNOSIS — D509 Iron deficiency anemia, unspecified: Secondary | ICD-10-CM | POA: Diagnosis not present

## 2020-07-10 DIAGNOSIS — J1282 Pneumonia due to coronavirus disease 2019: Secondary | ICD-10-CM | POA: Diagnosis not present

## 2020-07-10 DIAGNOSIS — U071 COVID-19: Secondary | ICD-10-CM | POA: Diagnosis not present

## 2020-07-10 DIAGNOSIS — Z841 Family history of disorders of kidney and ureter: Secondary | ICD-10-CM | POA: Diagnosis not present

## 2020-07-10 DIAGNOSIS — F4310 Post-traumatic stress disorder, unspecified: Secondary | ICD-10-CM | POA: Diagnosis not present

## 2020-07-10 DIAGNOSIS — R29701 NIHSS score 1: Secondary | ICD-10-CM | POA: Diagnosis not present

## 2020-07-10 DIAGNOSIS — Z6841 Body Mass Index (BMI) 40.0 and over, adult: Secondary | ICD-10-CM | POA: Diagnosis not present

## 2020-07-10 DIAGNOSIS — E7800 Pure hypercholesterolemia, unspecified: Secondary | ICD-10-CM | POA: Diagnosis not present

## 2020-07-10 DIAGNOSIS — Z823 Family history of stroke: Secondary | ICD-10-CM | POA: Diagnosis not present

## 2020-07-10 DIAGNOSIS — E1165 Type 2 diabetes mellitus with hyperglycemia: Secondary | ICD-10-CM | POA: Diagnosis not present

## 2020-07-10 DIAGNOSIS — I2699 Other pulmonary embolism without acute cor pulmonale: Secondary | ICD-10-CM | POA: Diagnosis not present

## 2020-07-10 DIAGNOSIS — I63531 Cerebral infarction due to unspecified occlusion or stenosis of right posterior cerebral artery: Secondary | ICD-10-CM | POA: Diagnosis not present

## 2020-07-10 LAB — URINALYSIS, ROUTINE W REFLEX MICROSCOPIC
Bilirubin Urine: NEGATIVE
Glucose, UA: NEGATIVE mg/dL
Ketones, ur: 5 mg/dL — AB
Nitrite: POSITIVE — AB
Protein, ur: 30 mg/dL — AB
RBC / HPF: >50 RBC/hpf — ABNORMAL HIGH (ref 0–5)
Specific Gravity, Urine: 1.014 (ref 1.005–1.030)
WBC, UA: >50 WBC/hpf — ABNORMAL HIGH (ref 0–5)
pH: 6 (ref 5.0–8.0)

## 2020-07-10 LAB — RAPID URINE DRUG SCREEN, HOSP PERFORMED
Amphetamines: NOT DETECTED
Barbiturates: NOT DETECTED
Benzodiazepines: NOT DETECTED
Cocaine: NOT DETECTED
Opiates: NOT DETECTED
Tetrahydrocannabinol: NOT DETECTED

## 2020-07-10 LAB — MRSA PCR SCREENING: MRSA by PCR: NEGATIVE

## 2020-07-10 LAB — CBG MONITORING, ED: Glucose-Capillary: 151 mg/dL — ABNORMAL HIGH (ref 70–99)

## 2020-07-10 LAB — POC URINE PREG, ED: Preg Test, Ur: NEGATIVE

## 2020-07-10 MED ORDER — CHLORHEXIDINE GLUCONATE CLOTH 2 % EX PADS
6.0000 | MEDICATED_PAD | Freq: Every day | CUTANEOUS | Status: DC
  Administered 2020-07-10 – 2020-07-16 (×3): 6 via TOPICAL

## 2020-07-10 NOTE — ED Notes (Signed)
Assisted pt to bedside commode to urinate. Instructed pt on clean catch urine specimen.  Urine specimen sent to lab.

## 2020-07-10 NOTE — H&P (Signed)
Admission H&P    Chief Complaint: Left sided numbness, weakness and slurred speech.  HPI: Ethelda L Kallen is an 39 y.o. female with a PMHx of DM, HLD, HTN, hypoactive thyroid, depression and PTSD who arrives to the [facility] ICU in transfer from the [facility] ED for stroke care following IV tPA administration at [facility]. She presented there as a Code Stroke yesterday evening and was evaluated by [facility] Teleneurology. CT head was negative for hemorrhage. Her NIHSS was 1 for sensory loss. Decision was made to administer tPA following informed consent. Of note, she is a poorly controlled diabetic with last HgbA1c of 11.   Per Dr. Johny Chess HPI from his Teleneurology note on 12/13: "39/F with PMH of DM, HTN, HLD, recent COVID +ve on 06/27/2020, presented to ER at [facility] with LKW of 1800hr and sudden onset of left sided numbness, weakness and slurrd speech. No history of prior stroke. A1c in 11 range last check per patient. Family history of stroke in mother in her 67s. Symptoms rapidly improving while in ER. Complains of mild headache. Not a migrainer and no history of headaches in the past. Denies fevers but has been feeling rundown due to COVID."  Dr. Johny Chess assessment/plan from 12/13 note has been reviewed: "On exam continued to have left hadn weakness without vertical drift and sensory deficits on left, which were now mild. Had a detailed discussion with her about this being a possibly stroke and treating with IV tPA, inspite of low NIHSS, this being disabling (unable to use left hand properly). She agreed to proceed with tPA, which was given at [facility] (see timing above)"  Labs at [facility]:  Preg test: negative EtOH level < 10 Utox negative U/A: > 50 WBC, + hyaline casts, mucus, calcium oxalate, nitrite, leukocyte esterase, elevated protein, and budding yeast Covid + WBC 14.9  EKG: Sinus rhythm Nonspecific intraventricular conduction delay No significant change since prior 5/15  CT head:  There is no acute intracranial  hemorrhage or evidence of acute infarction. ASPECT score is 10.  Past Medical History:  Diagnosis Date  . Depression   . Diabetes mellitus without complication ([place])   . Hyperlipidemia   . Hypertension   . Hypoactive thyroid   . Hypothyroid   . Mental disorder    depression and PTSD    Past Surgical History:  Procedure Laterality Date  . CESAREAN SECTION      Family History  Problem Relation Age of Onset  . Diabetes Mother   . Hypertension Mother   . Hyperlipidemia Mother   . Kidney disease Mother   . Stroke Mother   . Diabetes Father   . Heart disease Father   . Liver disease Son        biliary atresia   . Asthma Son   . ADD / ADHD Son   . ODD Son   . Asthma Son   . ADD / ADHD Son   . ODD Son   . Cancer Paternal Aunt   . Colon cancer Neg Hx    Social History:  reports that she has never smoked. She has never used smokeless tobacco. She reports current alcohol use. She reports that she does not use drugs.  Allergies: No Known Allergies  Medications Prior to Admission  Medication Sig Dispense Refill  . Accu-Chek FastClix Lancets MISC Use to check blood glucose twice a day 60 each 0  . acetaminophen (TYLENOL) 325 MG tablet Take 650 mg by mouth every 6 (six) hours as needed.     Marland Kitchen  atorvastatin (LIPITOR) 20 MG tablet Take 1 tablet (20 mg total) by mouth daily. 90 tablet 3  . blood glucose meter kit and supplies KIT Dispense based on patient and insurance preference. Use up to four times daily as directed. (FOR ICD-9 250.00, 250.01). 1 each 0  . FLUoxetine (PROZAC) 20 MG capsule Take three pills each morning as directed. (Patient taking differently: Take 60 mg by mouth daily. Take three pills each morning as directed.) 90 capsule 3  . glucose blood (ACCU-CHEK GUIDE) test strip Use to check glucose twice a day 60 each 0  . hydrochlorothiazide (MICROZIDE) 12.5 MG capsule Take 1 capsule (12.5 mg total) by mouth daily. 30 capsule 0  . levothyroxine (SYNTHROID) 150 MCG  tablet Take 1 tablet (150 mcg total) by mouth daily. 30 tablet 11  . metFORMIN (GLUCOPHAGE) 500 MG tablet TAKE 1 TABLET BY MOUTH WITH BREAKFAST AND 2 TABLETS WITH EVENING MEAL 90 tablet 0  . sitaGLIPtin (JANUVIA) 50 MG tablet Take 50 mg by mouth daily.    . traZODone (DESYREL) 100 MG tablet Take 100 mg by mouth at bedtime as needed.  (Patient not taking: Reported on 07/10/2020)      ROS: As per HPI. She feels back to baseline and is not currently complaining of any symptoms.   Physical Examination: Blood pressure 122/71, pulse (!) 116, temperature 99.9 F (37.7 C), temperature source Axillary, resp. rate (!) 21, height $RemoveBe'5\' 4"'HweiLwxcg$  (1.626 m), weight 120.4 kg, SpO2 94 %.  HEENT-  [place]/AT  Lungs - Respirations unlabored. Occasional mild dry cough. No wheezing.  Extremities - No edema  Neurologic Examination: Mental Status: Alert, oriented, thought content appropriate.  Speech fluent without evidence of aphasia.  Able to follow all commands without difficulty. Cranial Nerves: II:  Visual fields intact with no extinction to DSS. PERRL.  III,IV, VI: EOMI. No ptosis. No diplopia.  V,VII: Smile symmetric. Facial light touch sensation normal bilaterally VIII: Hearing intact to conversation IX,X: No hypoponia XI: Symmetric XII: Midline tongue extension  Motor: Right : Upper extremity   5/5    Left:     Upper extremity   5/5  Lower extremity   5/5     Lower extremity   5/5 Normal tone throughout; no atrophy noted Sensory: Light touch intact throughout, bilaterally Deep Tendon Reflexes:  2+ BUE and BLE Plantars: Right: downgoing   Left: downgoing Cerebellar: No ataxia with FNF bilaterally Gait: Deferred due to falls risk concerns.   Results for orders placed or performed during the hospital encounter of 07/09/20 (from the past 48 hour(s))  CBG monitoring, ED     Status: Abnormal   Collection Time: 07/09/20  7:48 PM  Result Value Ref Range   Glucose-Capillary 153 (H) 70 - 99 mg/dL    Comment:  Glucose reference range applies only to samples taken after fasting for at least 8 hours.  Ethanol     Status: None   Collection Time: 07/09/20  8:06 PM  Result Value Ref Range   Alcohol, Ethyl (B) <10 <10 mg/dL    Comment: (NOTE) Lowest detectable limit for serum alcohol is 10 mg/dL.  For medical purposes only. Performed at [facility], [address] [place], [place] [address]   Protime-INR     Status: None   Collection Time: 07/09/20  8:06 PM  Result Value Ref Range   Prothrombin Time 14.0 11.4 - 15.2 seconds   INR 1.1 0.8 - 1.2    Comment: (NOTE) INR goal varies based on device  and disease states. Performed at [facility], [address], [place], [place] [address]   APTT     Status: None   Collection Time: 07/09/20  8:06 PM  Result Value Ref Range   aPTT 24 24 - 36 seconds    Comment: Performed at [facility], [address] [place], [place] [address]  CBC     Status: Abnormal   Collection Time: 07/09/20  8:06 PM  Result Value Ref Range   WBC 14.9 (H) 4.0 - 10.5 K/uL   RBC 3.20 (L) 3.87 - 5.11 MIL/uL   Hemoglobin 7.4 (L) 12.0 - 15.0 g/dL   HCT 91.6 (L) 38.4 - 66.5 %   MCV 79.7 (L) 80.0 - 100.0 fL   MCH 23.1 (L) 26.0 - 34.0 pg   MCHC 29.0 (L) 30.0 - 36.0 g/dL   RDW 99.3 57.0 - 17.7 %   Platelets 841 (H) 150 - 400 K/uL   nRBC 0.5 (H) 0.0 - 0.2 %    Comment: Performed at [facility], [address] [place], [place] [address]  Differential     Status: Abnormal   Collection Time: 07/09/20  8:06 PM  Result Value Ref Range   Neutrophils Relative % 72 %   Neutro Abs 10.7 (H) 1.7 - 7.7 K/uL   Lymphocytes Relative 17 %   Lymphs Abs 2.5 0.7 - 4.0 K/uL   Monocytes Relative 6 %   Monocytes Absolute 0.9 0.1 - 1.0 K/uL   Eosinophils Relative 2 %   Eosinophils Absolute 0.3 0.0 - 0.5 K/uL   Basophils Relative 0 %   Basophils Absolute 0.0 0.0 - 0.1 K/uL   Immature Granulocytes 3 %   Abs Immature Granulocytes 0.42 (H) 0.00 - 0.07 K/uL    Comment: Performed at [facility], [address], [place], [place] [address]  Comprehensive metabolic panel     Status: Abnormal   Collection Time: 07/09/20  8:06 PM  Result Value Ref Range   Sodium 133 (L) 135 - 145 mmol/L   Potassium 3.2 (L) 3.5 - 5.1 mmol/L   Chloride 96 (L) 98 - 111 mmol/L   CO2 22 22 - 32 mmol/L   Glucose, Bld 151 (H) 70 - 99 mg/dL    Comment: Glucose reference range applies only to samples taken after fasting for at least 8 hours.   BUN 8 6 - 20 mg/dL   Creatinine, Ser 3.00 0.44 - 1.00 mg/dL   Calcium 9.3 8.9 - 76.2 mg/dL   Total Protein 7.9 6.5 - 8.1 g/dL   Albumin 3.9 3.5 - 5.0 g/dL   AST 38 15 - 41 U/L   ALT 42 0 - 44 U/L   Alkaline Phosphatase 65 38 - 126 U/L   Total Bilirubin 1.2 0.3 - 1.2 mg/dL   GFR, Estimated >26 >33 mL/min    Comment: (NOTE) Calculated using the CKD-EPI Creatinine Equation (2021)    Anion gap 15 5 - 15    Comment: Performed at [facility], [address] [place], [place] [address]  Resp Panel by RT-PCR (Flu A&B, Covid) Nasopharyngeal Swab     Status: Abnormal   Collection Time: 07/09/20  8:25 PM   Specimen: Nasopharyngeal Swab; Nasopharyngeal(NP) swabs in vial transport medium  Result Value Ref Range   SARS Coronavirus 2 by RT PCR POSITIVE (A) NEGATIVE    Comment: CRITICAL RESULT CALLED TO, READ BACK BY AND VERIFIED WITH: T EASTER AT 2151 ON 07/09/2020 BY MOSLEY,J (NOTE) SARS-CoV-2 target nucleic acids are  DETECTED.  The SARS-CoV-2 RNA is generally detectable in upper respiratory specimens during the acute phase of infection. Positive results are indicative of the presence of the identified virus, but do not rule out bacterial infection or co-infection with other pathogens not detected by the test. Clinical correlation with patient history and other diagnostic information is necessary to determine patient infection status. The expected result is Negative.  Fact Sheet for Patients: BloggerCourse.com  Fact Sheet for Healthcare  Providers: SeriousBroker.it  This test is not yet approved or cleared by the [place] FDA and  has been authorized for detection and/or diagnosis of SARS-CoV-2 by FDA under an Emergency Use Authorization (EUA).  This EUA will remain in effect (meanin g this test can be used) for the duration of  the COVID-19 declaration under Section 564(b)(1) of the Act, 21 U.S.C. section 360bbb-3(b)(1), unless the authorization is terminated or revoked sooner.     Influenza A by PCR NEGATIVE NEGATIVE   Influenza B by PCR NEGATIVE NEGATIVE    Comment: (NOTE) The Xpert Xpress SARS-CoV-2/FLU/RSV plus assay is intended as an aid in the diagnosis of influenza from Nasopharyngeal swab specimens and should not be used as a sole basis for treatment. Nasal washings and aspirates are unacceptable for Xpert Xpress SARS-CoV-2/FLU/RSV testing.  Fact Sheet for Patients: BloggerCourse.com  Fact Sheet for Healthcare Providers: SeriousBroker.it  This test is not yet approved or cleared by the [place] FDA and has been authorized for detection and/or diagnosis of SARS-CoV-2 by FDA under an Emergency Use Authorization (EUA). This EUA will remain in effect (meaning this test can be used) for the duration of the COVID-19 declaration under Section 564(b)(1) of the Act, 21 U.S.C. section 360bbb-3(b)(1), unless the authorization is terminated or revoked.  Performed at [facility], [address], [place], [place] [address]   Urine rapid drug screen (hosp performed)     Status: None   Collection Time: 07/10/20  4:29 AM  Result Value Ref Range   Opiates NONE DETECTED NONE DETECTED   Cocaine NONE DETECTED NONE DETECTED   Benzodiazepines NONE DETECTED NONE DETECTED   Amphetamines NONE DETECTED NONE DETECTED   Tetrahydrocannabinol NONE DETECTED NONE DETECTED   Barbiturates NONE DETECTED NONE DETECTED    Comment: (NOTE) DRUG SCREEN  FOR MEDICAL PURPOSES ONLY.  IF CONFIRMATION IS NEEDED FOR ANY PURPOSE, NOTIFY LAB WITHIN 5 DAYS.  LOWEST DETECTABLE LIMITS FOR URINE DRUG SCREEN Drug Class                     Cutoff (ng/mL) Amphetamine and metabolites    1000 Barbiturate and metabolites    200 Benzodiazepine                 200 Tricyclics and metabolites     300 Opiates and metabolites        300 Cocaine and metabolites        300 THC                            50 Performed at [facility], [address], [place], [place] [address]   Urinalysis, Routine w reflex microscopic     Status: Abnormal   Collection Time: 07/10/20  4:29 AM  Result Value Ref Range   Color, Urine YELLOW YELLOW   APPearance HAZY (A) CLEAR   Specific Gravity, Urine 1.014 1.005 - 1.030   pH 6.0 5.0 - 8.0   Glucose, UA NEGATIVE NEGATIVE  mg/dL   Hgb urine dipstick LARGE (A) NEGATIVE   Bilirubin Urine NEGATIVE NEGATIVE   Ketones, ur 5 (A) NEGATIVE mg/dL   Protein, ur 30 (A) NEGATIVE mg/dL   Nitrite POSITIVE (A) NEGATIVE   Leukocytes,Ua LARGE (A) NEGATIVE   RBC / HPF >50 (H) 0 - 5 RBC/hpf   WBC, UA >50 (H) 0 - 5 WBC/hpf   Bacteria, UA FEW (A) NONE SEEN   Squamous Epithelial / LPF 0-5 0 - 5   WBC Clumps PRESENT    Mucus PRESENT    Budding Yeast PRESENT    Hyaline Casts, UA PRESENT    Ca Oxalate Crys, UA PRESENT     Comment: Performed at [facility], [address], [place], [place] [address]  POC urine preg, ED     Status: None   Collection Time: 07/10/20  4:30 AM  Result Value Ref Range   Preg Test, Ur NEGATIVE NEGATIVE    Comment:        THE SENSITIVITY OF THIS METHODOLOGY IS >24 mIU/mL   CBG monitoring, ED     Status: Abnormal   Collection Time: 07/10/20 10:13 AM  Result Value Ref Range   Glucose-Capillary 151 (H) 70 - 99 mg/dL    Comment: Glucose reference range applies only to samples taken after fasting for at least 8 hours.   Comment 1 Notify RN   MRSA PCR Screening     Status: None   Collection Time: 07/10/20 11:32 AM    Specimen: Nasopharyngeal  Result Value Ref Range   MRSA by PCR NEGATIVE NEGATIVE    Comment:        The GeneXpert MRSA Assay (FDA approved for NASAL specimens only), is one component of a comprehensive MRSA colonization surveillance program. It is not intended to diagnose MRSA infection nor to guide or monitor treatment for MRSA infections. Performed at [facility], [place] [address] [place],  [address]    CT HEAD CODE STROKE WO CONTRAST  Result Date: 07/09/2020 CLINICAL DATA:  Code stroke.  Left-sided weakness EXAM: CT HEAD WITHOUT CONTRAST TECHNIQUE: Contiguous axial images were obtained from the base of the skull through the vertex without intravenous contrast. COMPARISON:  None. FINDINGS: Brain: There is no acute intracranial hemorrhage, mass effect, or edema. Gray-white differentiation is preserved. Ventricles and sulci are normal in size and configuration. No extra-axial collection. Vascular: No hyperdense vessel. Skull: Unremarkable. Sinuses/Orbits: Diffuse paranasal sinus mucosal thickening. Orbits are unremarkable. Other: Mastoid air cells are clear. ASPECTS ([place] Stroke Program Early CT Score) - Ganglionic level infarction (caudate, lentiform nuclei, internal capsule, insula, M1-M3 cortex): 7 - Supraganglionic infarction (M4-M6 cortex): 3 Total score (0-10 with 10 being normal): 10 IMPRESSION: There is no acute intracranial hemorrhage or evidence of acute infarction. ASPECT score is 10. These results were called by telephone at the time of interpretation on 07/09/2020 at 7:50 pm to provider [facility] ZAMMIT , who verbally acknowledged these results. Electronically Signed   By: Macy Mis M.D.   On: 07/09/2020 19:52    Assessment: 39 y.o. female who presented to [facility] with left sided sensory numbness and weakness. She received IV tPA followed by transfer to [facility] for post-tPA care and stroke work up 1. Neurological exam is normal. 2. Stroke Risk Factors - DM, HLD and  HTN 3. U/A: > 50 WBC, + hyaline casts, mucus, calcium oxalate, nitrite, leukocyte esterase, elevated protein, and budding yeast 4. Covid +. On full Covid precautions. Mild and resolving symptoms. Now with mild  dry cough, but did have a white count on presentation to [facility] of 14.9 5. Hypothyroidism.   Plan: 1. Repeat HgbA1c. Fasting lipid panel 2. MRI of the brain without contrast 3. PT consult, OT consult, Speech consult 4. Echocardiogram 5. CTA of head and neck.  6. Prophylactic therapy- Will determine if she can be started on ASA after repeat CT. Now > 24 hours since tPA administration.  7. Risk factor modification 8. Telemetry monitoring 9. Increase home atorvastatin to 40 mg po qd. Obtaining CK level.  10. Empiric ceftriaxone for UTI. May also be a yeast infection. Urine culture pending.  11. BP management 12. Can be transferred to step-down unit with a Covid bed if repeat CT head is negative for hemorrhagic conversion 13. Covid full contact and airborne precautions.  14. Continue home synthroid.  15. SSI  Electronically signed: Dr. Kerney Elbe 07/10/2020, 8:16 PM

## 2020-07-11 ENCOUNTER — Inpatient Hospital Stay (HOSPITAL_COMMUNITY): Payer: Medicaid Other

## 2020-07-11 DIAGNOSIS — Z8673 Personal history of transient ischemic attack (TIA), and cerebral infarction without residual deficits: Secondary | ICD-10-CM | POA: Diagnosis not present

## 2020-07-11 DIAGNOSIS — I6389 Other cerebral infarction: Secondary | ICD-10-CM | POA: Diagnosis not present

## 2020-07-11 DIAGNOSIS — G9389 Other specified disorders of brain: Secondary | ICD-10-CM | POA: Diagnosis not present

## 2020-07-11 DIAGNOSIS — J3489 Other specified disorders of nose and nasal sinuses: Secondary | ICD-10-CM | POA: Diagnosis not present

## 2020-07-11 DIAGNOSIS — U071 COVID-19: Secondary | ICD-10-CM | POA: Diagnosis not present

## 2020-07-11 DIAGNOSIS — N390 Urinary tract infection, site not specified: Secondary | ICD-10-CM | POA: Diagnosis not present

## 2020-07-11 DIAGNOSIS — E1165 Type 2 diabetes mellitus with hyperglycemia: Secondary | ICD-10-CM | POA: Diagnosis not present

## 2020-07-11 DIAGNOSIS — I6621 Occlusion and stenosis of right posterior cerebral artery: Secondary | ICD-10-CM | POA: Diagnosis not present

## 2020-07-11 DIAGNOSIS — I63411 Cerebral infarction due to embolism of right middle cerebral artery: Secondary | ICD-10-CM | POA: Diagnosis not present

## 2020-07-11 DIAGNOSIS — D62 Acute posthemorrhagic anemia: Secondary | ICD-10-CM | POA: Diagnosis not present

## 2020-07-11 DIAGNOSIS — E7800 Pure hypercholesterolemia, unspecified: Secondary | ICD-10-CM

## 2020-07-11 DIAGNOSIS — I2609 Other pulmonary embolism with acute cor pulmonale: Secondary | ICD-10-CM | POA: Diagnosis not present

## 2020-07-11 DIAGNOSIS — D509 Iron deficiency anemia, unspecified: Secondary | ICD-10-CM | POA: Diagnosis not present

## 2020-07-11 DIAGNOSIS — I10 Essential (primary) hypertension: Secondary | ICD-10-CM | POA: Diagnosis not present

## 2020-07-11 LAB — LIPID PANEL
Cholesterol: 132 mg/dL (ref 0–200)
HDL: 23 mg/dL — ABNORMAL LOW (ref >40)
LDL Cholesterol: 69 mg/dL (ref 0–99)
Total CHOL/HDL Ratio: 5.7 RATIO
Triglycerides: 200 mg/dL — ABNORMAL HIGH (ref <150)
VLDL: 40 mg/dL (ref 0–40)

## 2020-07-11 LAB — ECHOCARDIOGRAM LIMITED
Area-P 1/2: 8.62 cm2
Height: 64 in
S' Lateral: 3.6 cm
Weight: 4246.4 oz

## 2020-07-11 LAB — URINALYSIS, COMPLETE (UACMP) WITH MICROSCOPIC
Bilirubin Urine: NEGATIVE
Glucose, UA: NEGATIVE mg/dL
Ketones, ur: NEGATIVE mg/dL
Nitrite: POSITIVE — AB
Protein, ur: 30 mg/dL — AB
RBC / HPF: >50 RBC/hpf — ABNORMAL HIGH (ref 0–5)
Specific Gravity, Urine: 1.015 (ref 1.005–1.030)
WBC, UA: >50 WBC/hpf — ABNORMAL HIGH (ref 0–5)
pH: 5 (ref 5.0–8.0)

## 2020-07-11 LAB — CBC
HCT: 22.5 % — ABNORMAL LOW (ref 36.0–46.0)
HCT: 24.4 % — ABNORMAL LOW (ref 36.0–46.0)
Hemoglobin: 6.6 g/dL — CL (ref 12.0–15.0)
Hemoglobin: 7.8 g/dL — ABNORMAL LOW (ref 12.0–15.0)
MCH: 23 pg — ABNORMAL LOW (ref 26.0–34.0)
MCH: 25 pg — ABNORMAL LOW (ref 26.0–34.0)
MCHC: 29.3 g/dL — ABNORMAL LOW (ref 30.0–36.0)
MCHC: 32 g/dL (ref 30.0–36.0)
MCV: 78.4 fL — ABNORMAL LOW (ref 80.0–100.0)
MCV: 78.2 fL — ABNORMAL LOW (ref 80.0–100.0)
Platelets: 562 10*3/uL — ABNORMAL HIGH (ref 150–400)
Platelets: 542 10*3/uL — ABNORMAL HIGH (ref 150–400)
RBC: 2.87 MIL/uL — ABNORMAL LOW (ref 3.87–5.11)
RBC: 3.12 MIL/uL — ABNORMAL LOW (ref 3.87–5.11)
RDW: 15.4 % (ref 11.5–15.5)
RDW: 15.6 % — ABNORMAL HIGH (ref 11.5–15.5)
WBC: 9.4 10*3/uL (ref 4.0–10.5)
WBC: 10 10*3/uL (ref 4.0–10.5)
nRBC: 0.4 % — ABNORMAL HIGH (ref 0.0–0.2)
nRBC: 0.4 % — ABNORMAL HIGH (ref 0.0–0.2)

## 2020-07-11 LAB — COMPREHENSIVE METABOLIC PANEL
ALT: 25 U/L (ref 0–44)
AST: 19 U/L (ref 15–41)
Albumin: 3.3 g/dL — ABNORMAL LOW (ref 3.5–5.0)
Alkaline Phosphatase: 57 U/L (ref 38–126)
Anion gap: 15 (ref 5–15)
BUN: 8 mg/dL (ref 6–20)
CO2: 24 mmol/L (ref 22–32)
Calcium: 9.3 mg/dL (ref 8.9–10.3)
Chloride: 98 mmol/L (ref 98–111)
Creatinine, Ser: 0.75 mg/dL (ref 0.44–1.00)
GFR, Estimated: >60 mL/min (ref >60)
Glucose, Bld: 126 mg/dL — ABNORMAL HIGH (ref 70–99)
Potassium: 3.1 mmol/L — ABNORMAL LOW (ref 3.5–5.1)
Sodium: 137 mmol/L (ref 135–145)
Total Bilirubin: 1.2 mg/dL (ref 0.3–1.2)
Total Protein: 6.7 g/dL (ref 6.5–8.1)

## 2020-07-11 LAB — ABO/RH: ABO/RH(D): A POS

## 2020-07-11 LAB — CK: Total CK: 36 U/L — ABNORMAL LOW (ref 38–234)

## 2020-07-11 LAB — PREPARE RBC (CROSSMATCH)

## 2020-07-11 LAB — GLUCOSE, CAPILLARY
Glucose-Capillary: 119 mg/dL — ABNORMAL HIGH (ref 70–99)
Glucose-Capillary: 123 mg/dL — ABNORMAL HIGH (ref 70–99)
Glucose-Capillary: 122 mg/dL — ABNORMAL HIGH (ref 70–99)
Glucose-Capillary: 130 mg/dL — ABNORMAL HIGH (ref 70–99)

## 2020-07-11 LAB — PROTIME-INR
INR: 1.1 (ref 0.8–1.2)
Prothrombin Time: 14.1 seconds (ref 11.4–15.2)

## 2020-07-11 LAB — HEMOGLOBIN A1C
Hgb A1c MFr Bld: 8.6 % — ABNORMAL HIGH (ref 4.8–5.6)
Mean Plasma Glucose: 200.12 mg/dL

## 2020-07-11 LAB — HIV ANTIBODY (ROUTINE TESTING W REFLEX): HIV Screen 4th Generation wRfx: NONREACTIVE

## 2020-07-11 LAB — APTT: aPTT: 28 seconds (ref 24–36)

## 2020-07-11 LAB — TRIGLYCERIDES: Triglycerides: 200 mg/dL — ABNORMAL HIGH (ref <150)

## 2020-07-11 MED ORDER — ACETAMINOPHEN 650 MG RE SUPP: 650.0000 mg | RECTAL | Status: DC | PRN

## 2020-07-11 MED ORDER — PANTOPRAZOLE SODIUM 40 MG IV SOLR
40.0000 mg | Freq: Every day | INTRAVENOUS | Status: DC
  Administered 2020-07-11: 02:00:00 40 mg via INTRAVENOUS
  Filled 2020-07-11: qty 40

## 2020-07-11 MED ORDER — ASPIRIN EC 81 MG PO TBEC: 81.0000 mg | DELAYED_RELEASE_TABLET | Freq: Every day | ORAL | Status: DC

## 2020-07-11 MED ORDER — STROKE: EARLY STAGES OF RECOVERY BOOK
Freq: Once | Status: AC
  Filled 2020-07-11: qty 1

## 2020-07-11 MED ORDER — ACETAMINOPHEN 160 MG/5ML PO SOLN: 650.0000 mg | ORAL | Status: DC | PRN

## 2020-07-11 MED ORDER — ATORVASTATIN CALCIUM 10 MG PO TABS
20.0000 mg | ORAL_TABLET | Freq: Every day | ORAL | Status: DC
  Administered 2020-07-11 – 2020-07-15 (×5): 20 mg via ORAL
  Filled 2020-07-11 (×5): qty 2

## 2020-07-11 MED ORDER — SODIUM CHLORIDE 0.9 % IV SOLN
1.0000 g | Freq: Every day | INTRAVENOUS | Status: DC
  Administered 2020-07-11 – 2020-07-12 (×3): 1 g via INTRAVENOUS
  Filled 2020-07-11 (×2): qty 10
  Filled 2020-07-11: qty 1

## 2020-07-11 MED ORDER — INSULIN ASPART 100 UNIT/ML ~~LOC~~ SOLN
6.0000 [IU] | Freq: Three times a day (TID) | SUBCUTANEOUS | Status: DC
  Administered 2020-07-11 – 2020-07-16 (×16): 6 [IU] via SUBCUTANEOUS

## 2020-07-11 MED ORDER — SODIUM CHLORIDE 0.9% IV SOLUTION: Freq: Once | INTRAVENOUS | Status: DC

## 2020-07-11 MED ORDER — INSULIN ASPART 100 UNIT/ML ~~LOC~~ SOLN
0.0000 [IU] | Freq: Three times a day (TID) | SUBCUTANEOUS | Status: DC
  Administered 2020-07-11 (×2): 3 [IU] via SUBCUTANEOUS
  Administered 2020-07-12: 19:00:00 7 [IU] via SUBCUTANEOUS
  Administered 2020-07-12 (×2): 3 [IU] via SUBCUTANEOUS
  Administered 2020-07-13 (×3): 11 [IU] via SUBCUTANEOUS
  Administered 2020-07-14: 17:00:00 7 [IU] via SUBCUTANEOUS
  Administered 2020-07-14: 07:00:00 4 [IU] via SUBCUTANEOUS
  Administered 2020-07-14: 12:00:00 3 [IU] via SUBCUTANEOUS
  Administered 2020-07-15: 12:00:00 4 [IU] via SUBCUTANEOUS
  Administered 2020-07-15 – 2020-07-16 (×2): 3 [IU] via SUBCUTANEOUS

## 2020-07-11 MED ORDER — ACETAMINOPHEN 325 MG PO TABS: 650.0000 mg | ORAL_TABLET | ORAL | Status: DC | PRN

## 2020-07-11 MED ORDER — POTASSIUM CHLORIDE CRYS ER 20 MEQ PO TBCR
40.0000 meq | EXTENDED_RELEASE_TABLET | ORAL | Status: AC
  Administered 2020-07-11 (×2): 40 meq via ORAL
  Filled 2020-07-11 (×2): qty 2

## 2020-07-11 MED ORDER — CLOPIDOGREL BISULFATE 75 MG PO TABS: 75.0000 mg | ORAL_TABLET | Freq: Every day | ORAL | Status: DC

## 2020-07-11 MED ORDER — ATORVASTATIN CALCIUM 40 MG PO TABS: 40.0000 mg | ORAL_TABLET | Freq: Every day | ORAL | Status: DC

## 2020-07-11 MED ORDER — ATORVASTATIN CALCIUM 10 MG PO TABS: 20.0000 mg | ORAL_TABLET | Freq: Every day | ORAL | Status: DC

## 2020-07-11 MED ORDER — FUROSEMIDE 10 MG/ML IJ SOLN: 20.0000 mg | Freq: Once | INTRAMUSCULAR | Status: DC

## 2020-07-11 MED ORDER — SENNOSIDES-DOCUSATE SODIUM 8.6-50 MG PO TABS
1.0000 | ORAL_TABLET | Freq: Every evening | ORAL | Status: DC | PRN
Start: 2020-07-11 — End: 2020-07-16

## 2020-07-11 MED ORDER — LEVOTHYROXINE SODIUM 75 MCG PO TABS
150.0000 ug | ORAL_TABLET | Freq: Every day | ORAL | Status: DC
  Administered 2020-07-11 – 2020-07-16 (×6): 150 ug via ORAL
  Filled 2020-07-11 (×6): qty 2

## 2020-07-11 MED ORDER — HYDROCHLOROTHIAZIDE 12.5 MG PO CAPS
12.5000 mg | ORAL_CAPSULE | Freq: Every day | ORAL | Status: DC
  Filled 2020-07-11: qty 1

## 2020-07-11 MED ORDER — LORAZEPAM 2 MG/ML IJ SOLN
2.0000 mg | Freq: Once | INTRAMUSCULAR | Status: AC
  Administered 2020-07-11: 22:00:00 2 mg via INTRAVENOUS
  Filled 2020-07-11: qty 1

## 2020-07-11 MED ORDER — FLUOXETINE HCL 20 MG PO CAPS
60.0000 mg | ORAL_CAPSULE | Freq: Every day | ORAL | Status: DC
  Administered 2020-07-11 – 2020-07-16 (×6): 60 mg via ORAL
  Filled 2020-07-11 (×6): qty 3

## 2020-07-11 MED ORDER — FERROUS SULFATE 325 (65 FE) MG PO TABS
325.0000 mg | ORAL_TABLET | Freq: Three times a day (TID) | ORAL | Status: DC
  Administered 2020-07-11 – 2020-07-16 (×15): 325 mg via ORAL
  Filled 2020-07-11 (×16): qty 1

## 2020-07-11 MED ORDER — IOHEXOL 350 MG/ML SOLN
75.0000 mL | Freq: Once | INTRAVENOUS | Status: AC | PRN
  Administered 2020-07-11: 75 mL via INTRAVENOUS

## 2020-07-11 MED ORDER — PANTOPRAZOLE SODIUM 40 MG PO TBEC
40.0000 mg | DELAYED_RELEASE_TABLET | Freq: Every day | ORAL | Status: DC
  Administered 2020-07-11 – 2020-07-16 (×6): 40 mg via ORAL
  Filled 2020-07-11 (×6): qty 1

## 2020-07-11 NOTE — Progress Notes (Signed)
Paged for critically low Hgb of 6.6. Hgb was 7.4 on 12/13. Per patient, she has chronic anemia. Type and cross with order to transfuse 2 U PRBC has been entered. Informed consent obtained from the patient. Fecal occult blood ordered.   Electronically signed: Dr. Caryl Pina

## 2020-07-11 NOTE — Progress Notes (Signed)
STROKE TEAM PROGRESS NOTE   INTERVAL HISTORY No family is at the bedside.  Pt sitting in bed, at her baseline. All symptoms resolved. Pending CTA head and neck and MRI.   OBJECTIVE Vitals:   07/11/20 1015 07/11/20 1030 07/11/20 1045 07/11/20 1100  BP: 118/67 128/63 138/66 133/66  Pulse: 96 96 95 91  Resp: (!) 25 (!) 29 (!) 26 (!) 26  Temp: 98.1 F (36.7 C) 98 F (36.7 C)  98 F (36.7 C)  TempSrc: Oral Oral  Oral  SpO2: 95% 95% 95% 93%  Weight:      Height:        CBC:  Recent Labs  Lab 07/09/20 2006 07/11/20 0034  WBC 14.9* 9.4  NEUTROABS 10.7*  --   HGB 7.4* 6.6*  HCT 25.5* 22.5*  MCV 79.7* 78.4*  PLT 841* 562*    Basic Metabolic Panel:  Recent Labs  Lab 07/09/20 2006 07/11/20 0034  NA 133* 137  K 3.2* 3.1*  CL 96* 98  CO2 22 24  GLUCOSE 151* 126*  BUN 8 8  CREATININE 0.81 0.75  CALCIUM 9.3 9.3    Lipid Panel:     Component Value Date/Time   CHOL 132 07/11/2020 0034   CHOL 227 (H) 10/28/2016 0927   TRIG 200 (H) 07/11/2020 0417   HDL 23 (L) 07/11/2020 0034   HDL 36 (L) 10/28/2016 0927   CHOLHDL 5.7 07/11/2020 0034   VLDL 40 07/11/2020 0034   LDLCALC 69 07/11/2020 0034   LDLCALC 91 09/25/2017 0836   HgbA1c:  Lab Results  Component Value Date   HGBA1C 8.6 (H) 07/11/2020   Urine Drug Screen:     Component Value Date/Time   LABOPIA NONE DETECTED 07/10/2020 0429   COCAINSCRNUR NONE DETECTED 07/10/2020 0429   LABBENZ NONE DETECTED 07/10/2020 0429   AMPHETMU NONE DETECTED 07/10/2020 0429   THCU NONE DETECTED 07/10/2020 0429   LABBARB NONE DETECTED 07/10/2020 0429    Alcohol Level     Component Value Date/Time   ETH <10 07/09/2020 2006    IMAGING  CT HEAD CODE STROKE WO CONTRAST 07/09/2020 IMPRESSION:  There is no acute intracranial hemorrhage or evidence of acute infarction. ASPECT score is 10.   Transthoracic Echocardiogram  1. Left ventricular ejection fraction, by estimation, is 55 to 60%. The  left ventricle has normal function.  Left ventricular endocardial border  not optimally defined to evaluate regional wall motion. Left ventricular  diastolic parameters are consistent  with Grade I diastolic dysfunction (impaired relaxation).  2. Right ventricular systolic function is normal. Tricuspid regurgitation  signal is inadequate for assessing PA pressure.  3. The mitral valve is normal in structure. Trivial mitral valve  regurgitation.  4. The aortic valve is normal in structure.   ECG - SR rate 90 BPM. (See cardiology reading for complete details)   PHYSICAL EXAM  Temp:  [97.7 F (36.5 C)-98.6 F (37 C)] 98 F (36.7 C) (12/15 1640) Pulse Rate:  [80-105] 94 (12/15 1640) Resp:  [19-29] 20 (12/15 1640) BP: (110-146)/(56-80) 117/80 (12/15 1640) SpO2:  [91 %-98 %] 98 % (12/15 1640)  General - Well nourished, well developed, in no apparent distress.  Ophthalmologic - fundi not visualized due to noncooperation.  Cardiovascular - Regular rhythm and rate.  Mental Status -  Level of arousal and orientation to time, place, and person were intact. Language including expression, naming, repetition, comprehension was assessed and found intact. Attention span and concentration were normal. Recent and remote memory were  intact. Fund of Knowledge was assessed and was intact.  Cranial Nerves II - XII - II - Visual field intact OU. III, IV, VI - Extraocular movements intact. V - Facial sensation intact bilaterally. VII - Facial movement intact bilaterally. VIII - Hearing & vestibular intact bilaterally. X - Palate elevates symmetrically. XI - Chin turning & shoulder shrug intact bilaterally. XII - Tongue protrusion intact.  Motor Strength - The patient's strength was normal in all extremities and pronator drift was absent.  Bulk was normal and fasciculations were absent.   Motor Tone - Muscle tone was assessed at the neck and appendages and was normal.  Reflexes - The patient's reflexes were symmetrical in  all extremities and she had no pathological reflexes.  Sensory - Light touch, temperature/pinprick were assessed and were symmetrical.    Coordination - The patient had normal movements in the hands and feet with no ataxia or dysmetria.  Tremor was absent.  Gait and Station - deferred.   ASSESSMENT/PLAN [name] is a 39 y.o. female with history of DM, HTN, HLD, depression, PTSD, hypothyroid, recent COVID +ve on 06/27/2020, presented to ER at [facility] with LKW of 1800hr and sudden onset of left sided numbness, weakness, mild HA, and slurred speech. Deficits were improving in ED. The patient received IV t-PA at [facility] Monday 07/09/20 evening ? Time via tele-med consult Dr Rory Percy. Admitted to [facility] 12/14 Dr [name]. Hgb today 6.6 -> tx'd 2 units per Dr [name] (pt has hx of anemia) . Stroke: right MCA/PCA infarcts s/p tPA - etiology unclear, COVID hypercoagulopathy vs. Paradoxical emboli ?   CT Head - There is no acute intracranial hemorrhage or evidence of acute infarction. ASPECT score is 10.       CTA H&N - unremarkable, but concerning for right occipital and parietal cortical infarcts  MRI head - pending  2D Echo EF 55-60%  LE venous doppler pending  TCD bubble study pending  Sars Corona Virus 2 - positive on 12/13  LDL - 69  HgbA1c - 8.6  UDS - negative  HIV neg  TSH/B12/RPR pending  Hypercoagulable and autoimmune work up pending  VTE prophylaxis - SCDs  No antithrombotic prior to admission, now on No antithrombotic due to severe anemia needing PRBC  Patient will be counseled to be compliant with her antithrombotic medications  Ongoing aggressive stroke risk factor management  Therapy recommendations:  pending  Disposition:  Pending  Anemia   Hb 7.4-6.6-PRBC-7.8  Heavy periods  Low MCV and MCH  Add iron pills  FOBT pending  Hold off antiplatelet for now, once anemia stable, consider start aspirin 81  CBC monitoring  UTI  UA WBC more than  50  On Rocephin  Urine culture pending  COVID  Reported Covid + 06/27/2020 at [facility]  Current 07/09/2020 still positive for Covid test  Airborne isolation  Ferritin/fibrin/ESR/CRP pending   Still has intermittent coughing, however no respiratory compromise  Hypertension  Home BP meds: HCTZ 12.5 mg daily  Stable . Long-term BP goal normotensive  Hyperlipidemia  Home Lipid lowering medication: Lipitor 20 mg daily  LDL 69, goal < 70  Current lipid lowering medication: Lipitor 20 mg daily  Continue statin at discharge  Diabetes  Home diabetic meds: metformin ; Januvia  HgbA1c 8.6, goal < 7.0 SSI  CBG monitoring Close PCP follow-up for better DM control  Other Stroke Risk Factors  ETOH use, advised to drink no more than 1 alcoholic beverage per day.  Obesity, Body mass  index is 45.56 kg/m., recommend weight loss, diet and exercise as appropriate   Family hx stroke (mother)   Other Active Problems, Findings and Recommendations  Code status - Full code  Hypokalemia - potassium - 3.2->3.1 - supplemented  Hypothyroidism on Synthroid  Hospital day # 2  This patient is critically ill due to left brain stroke status post TPA, Covid infection, severe anemia needing blood transfusion, UTI and at significant risk of neurological worsening, death form hemorrhagic conversion, hemorrhage from TPA, Covid pneumonia, urosepsis, severe anemia related to shock. This patient's care requires constant monitoring of vital signs, hemodynamics, respiratory and cardiac monitoring, review of multiple databases, neurological assessment, discussion with family, other specialists and medical decision making of high complexity. I spent 40 minutes of neurocritical care time in the care of this patient.  April Hawking, April Carney Stroke Neurology 07/11/2020 8:21 PM    To contact Stroke Continuity provider, please refer to [email or web address] After hours, contact General Neurology

## 2020-07-11 NOTE — Progress Notes (Signed)
SLP Cancellation Note  Patient Details Name: April Carney MRN: [id number] DOB: 05/22/1981   Cancelled treatment:       Reason Eval/Treat Not Completed: Patient at procedure or test/unavailable (Pt currently with RN. SLP will f/u on subsequent date.)  Tonie Vizcarrondo I. Vear Clock, MS, CCC-SLP Acute Rehabilitation Services Office number [phone] Pager [phone]  Scheryl Marten 07/11/2020, 5:11 PM

## 2020-07-11 NOTE — Progress Notes (Signed)
OT Cancellation Note  Patient Details Name: DIETRICH KE MRN: [id number] DOB: [birth year]   Cancelled Treatment:    Reason Eval/Treat Not Completed: Medical issues which prohibited therapy (Hb of 6.6). OT will continue to follow for evaluation  Emelda Fear 07/11/2020, 8:30 AM   Nyoka Cowden OTR/L Acute Rehabilitation Services Pager: [phone] Office: [phone]

## 2020-07-11 NOTE — Progress Notes (Signed)
  Echocardiogram 2D Echocardiogram has been performed.  Pieter Partridge 07/11/2020, 11:40 AM

## 2020-07-11 NOTE — Plan of Care (Signed)

## 2020-07-11 NOTE — Progress Notes (Signed)
PT Cancellation Note  Patient Details Name: April Carney MRN: [id number] DOB: 03-08-1981   Cancelled Treatment:    Reason Eval/Treat Not Completed: Medical issues which prohibited therapy   Critically low Hgb; noted plan for blood transfusion today;   Will follow up later today as time allows;  Otherwise, will follow up for PT tomorrow;   Thank you,  Van Clines, PT  Acute Rehabilitation Services Pager [phone] Office [phone]     Levi Aland 07/11/2020, 8:50 AM

## 2020-07-12 ENCOUNTER — Inpatient Hospital Stay (HOSPITAL_COMMUNITY): Payer: Medicaid Other

## 2020-07-12 ENCOUNTER — Other Ambulatory Visit: Payer: Self-pay

## 2020-07-12 DIAGNOSIS — I639 Cerebral infarction, unspecified: Secondary | ICD-10-CM

## 2020-07-12 DIAGNOSIS — I63511 Cerebral infarction due to unspecified occlusion or stenosis of right middle cerebral artery: Secondary | ICD-10-CM | POA: Diagnosis not present

## 2020-07-12 DIAGNOSIS — G8194 Hemiplegia, unspecified affecting left nondominant side: Secondary | ICD-10-CM | POA: Diagnosis not present

## 2020-07-12 DIAGNOSIS — I2699 Other pulmonary embolism without acute cor pulmonale: Secondary | ICD-10-CM | POA: Diagnosis not present

## 2020-07-12 DIAGNOSIS — D62 Acute posthemorrhagic anemia: Secondary | ICD-10-CM | POA: Diagnosis not present

## 2020-07-12 DIAGNOSIS — R4781 Slurred speech: Secondary | ICD-10-CM | POA: Diagnosis not present

## 2020-07-12 DIAGNOSIS — U071 COVID-19: Secondary | ICD-10-CM | POA: Diagnosis not present

## 2020-07-12 DIAGNOSIS — E1165 Type 2 diabetes mellitus with hyperglycemia: Secondary | ICD-10-CM

## 2020-07-12 DIAGNOSIS — I63411 Cerebral infarction due to embolism of right middle cerebral artery: Secondary | ICD-10-CM | POA: Diagnosis not present

## 2020-07-12 DIAGNOSIS — R0602 Shortness of breath: Secondary | ICD-10-CM | POA: Diagnosis not present

## 2020-07-12 DIAGNOSIS — E7800 Pure hypercholesterolemia, unspecified: Secondary | ICD-10-CM | POA: Diagnosis not present

## 2020-07-12 DIAGNOSIS — D509 Iron deficiency anemia, unspecified: Secondary | ICD-10-CM

## 2020-07-12 DIAGNOSIS — I2609 Other pulmonary embolism with acute cor pulmonale: Secondary | ICD-10-CM | POA: Diagnosis not present

## 2020-07-12 DIAGNOSIS — I10 Essential (primary) hypertension: Secondary | ICD-10-CM | POA: Diagnosis not present

## 2020-07-12 DIAGNOSIS — E039 Hypothyroidism, unspecified: Secondary | ICD-10-CM | POA: Diagnosis not present

## 2020-07-12 DIAGNOSIS — R29701 NIHSS score 1: Secondary | ICD-10-CM | POA: Diagnosis not present

## 2020-07-12 DIAGNOSIS — J1282 Pneumonia due to coronavirus disease 2019: Secondary | ICD-10-CM | POA: Diagnosis not present

## 2020-07-12 DIAGNOSIS — N390 Urinary tract infection, site not specified: Secondary | ICD-10-CM | POA: Diagnosis not present

## 2020-07-12 DIAGNOSIS — Z6841 Body Mass Index (BMI) 40.0 and over, adult: Secondary | ICD-10-CM | POA: Diagnosis not present

## 2020-07-12 DIAGNOSIS — N920 Excessive and frequent menstruation with regular cycle: Secondary | ICD-10-CM | POA: Diagnosis not present

## 2020-07-12 LAB — GLUCOSE, CAPILLARY
Glucose-Capillary: 132 mg/dL — ABNORMAL HIGH (ref 70–99)
Glucose-Capillary: 121 mg/dL — ABNORMAL HIGH (ref 70–99)
Glucose-Capillary: 134 mg/dL — ABNORMAL HIGH (ref 70–99)
Glucose-Capillary: 209 mg/dL — ABNORMAL HIGH (ref 70–99)
Glucose-Capillary: 286 mg/dL — ABNORMAL HIGH (ref 70–99)

## 2020-07-12 LAB — T4, FREE: Free T4: 1.37 ng/dL — ABNORMAL HIGH (ref 0.61–1.12)

## 2020-07-12 LAB — BASIC METABOLIC PANEL
Anion gap: 12 (ref 5–15)
BUN: 7 mg/dL (ref 6–20)
CO2: 25 mmol/L (ref 22–32)
Calcium: 9.2 mg/dL (ref 8.9–10.3)
Chloride: 102 mmol/L (ref 98–111)
Creatinine, Ser: 0.84 mg/dL (ref 0.44–1.00)
GFR, Estimated: >60 mL/min (ref >60)
Glucose, Bld: 122 mg/dL — ABNORMAL HIGH (ref 70–99)
Potassium: 3.7 mmol/L (ref 3.5–5.1)
Sodium: 139 mmol/L (ref 135–145)

## 2020-07-12 LAB — CBC
HCT: 26.1 % — ABNORMAL LOW (ref 36.0–46.0)
Hemoglobin: 8 g/dL — ABNORMAL LOW (ref 12.0–15.0)
MCH: 24.5 pg — ABNORMAL LOW (ref 26.0–34.0)
MCHC: 30.7 g/dL (ref 30.0–36.0)
MCV: 79.8 fL — ABNORMAL LOW (ref 80.0–100.0)
Platelets: 516 10*3/uL — ABNORMAL HIGH (ref 150–400)
RBC: 3.27 MIL/uL — ABNORMAL LOW (ref 3.87–5.11)
RDW: 15.8 % — ABNORMAL HIGH (ref 11.5–15.5)
WBC: 8.9 10*3/uL (ref 4.0–10.5)
nRBC: 0.3 % — ABNORMAL HIGH (ref 0.0–0.2)

## 2020-07-12 LAB — VITAMIN B12: Vitamin B-12: 272 pg/mL (ref 180–914)

## 2020-07-12 LAB — TSH: TSH: 5.944 u[IU]/mL — ABNORMAL HIGH (ref 0.350–4.500)

## 2020-07-12 LAB — C-REACTIVE PROTEIN: CRP: 3.3 mg/dL — ABNORMAL HIGH (ref <1.0)

## 2020-07-12 LAB — SEDIMENTATION RATE: Sed Rate: 48 mm/hr — ABNORMAL HIGH (ref 0–22)

## 2020-07-12 LAB — PHOSPHORUS: Phosphorus: 3.6 mg/dL (ref 2.5–4.6)

## 2020-07-12 LAB — MAGNESIUM: Magnesium: 2.1 mg/dL (ref 1.7–2.4)

## 2020-07-12 LAB — FIBRINOGEN: Fibrinogen: 376 mg/dL (ref 210–475)

## 2020-07-12 LAB — RPR: RPR Ser Ql: NONREACTIVE

## 2020-07-12 LAB — D-DIMER, QUANTITATIVE: D-Dimer, Quant: 4.12 ug/mL-FEU — ABNORMAL HIGH (ref 0.00–0.50)

## 2020-07-12 MED ORDER — GUAIFENESIN-DM 100-10 MG/5ML PO SYRP
10.0000 mL | ORAL_SOLUTION | ORAL | Status: DC | PRN
  Administered 2020-07-12: 15:00:00 10 mL via ORAL
  Filled 2020-07-12: qty 10

## 2020-07-12 MED ORDER — SODIUM CHLORIDE 0.9 % IV SOLN
200.0000 mg | Freq: Once | INTRAVENOUS | Status: AC
  Administered 2020-07-12: 16:00:00 200 mg via INTRAVENOUS
  Filled 2020-07-12: qty 40

## 2020-07-12 MED ORDER — METHYLPREDNISOLONE SODIUM SUCC 125 MG IJ SOLR
120.0000 mg | Freq: Two times a day (BID) | INTRAMUSCULAR | Status: DC
  Administered 2020-07-12 (×2): 120 mg via INTRAVENOUS
  Filled 2020-07-12 (×2): qty 2

## 2020-07-12 MED ORDER — METOPROLOL TARTRATE 5 MG/5ML IV SOLN: 2.5000 mg | Freq: Four times a day (QID) | INTRAVENOUS | Status: DC | PRN

## 2020-07-12 MED ORDER — PREDNISONE 50 MG PO TABS: 50.0000 mg | ORAL_TABLET | Freq: Every day | ORAL | Status: DC

## 2020-07-12 MED ORDER — IPRATROPIUM-ALBUTEROL 20-100 MCG/ACT IN AERS
1.0000 | INHALATION_SPRAY | Freq: Four times a day (QID) | RESPIRATORY_TRACT | Status: DC
  Administered 2020-07-12: 15:00:00 1 via RESPIRATORY_TRACT
  Filled 2020-07-12: qty 4

## 2020-07-12 MED ORDER — POTASSIUM CHLORIDE CRYS ER 20 MEQ PO TBCR
40.0000 meq | EXTENDED_RELEASE_TABLET | Freq: Once | ORAL | Status: AC
  Administered 2020-07-12: 15:00:00 40 meq via ORAL
  Filled 2020-07-12: qty 2

## 2020-07-12 MED ORDER — SODIUM CHLORIDE 0.9 % IV SOLN
100.0000 mg | Freq: Every day | INTRAVENOUS | Status: DC
  Filled 2020-07-12: qty 20

## 2020-07-12 MED ORDER — ENOXAPARIN SODIUM 40 MG/0.4ML ~~LOC~~ SOLN
40.0000 mg | SUBCUTANEOUS | Status: DC
  Administered 2020-07-12: 15:00:00 40 mg via SUBCUTANEOUS
  Filled 2020-07-12: qty 0.4

## 2020-07-12 MED ORDER — ASPIRIN EC 81 MG PO TBEC
81.0000 mg | DELAYED_RELEASE_TABLET | Freq: Every day | ORAL | Status: DC
  Administered 2020-07-12 – 2020-07-13 (×2): 81 mg via ORAL
  Filled 2020-07-12 (×2): qty 1

## 2020-07-12 NOTE — Consult Note (Signed)
[facility] Medical Consultation  April Carney YIF:[id number] DOB: 21-May-1981 DOA: 07/09/2020 PCP: Patient, No Pcp Per   Requesting physician: Dr. Erlinda Hong Date of consultation: 06/27/2020 Reason for consultation: COVID  Impression/Recommendations Active Problems:   Stroke (cerebrum) ([place])   CVA (cerebral vascular accident) ([place])    COVID 19 PNA -Although patient was first tested positive more than 2 weeks ago, but appears like she has had the symptoms at least 3+ weeks, CT angiogram of the neck on the day admission which was part of initial stroke work-up showing multifocal infiltrates on B/L apex (likely also involve other parts of the lungs) and today's chest x-ray showed multifocal interstitial infiltrates indicating that persistent viral pneumonia/Covid lung involvement, and the PTs evaluation showed patient easily desatted minimal activity certainly raises concern about Thus I concluded that pt's COVID-19 pneumonia is not in recovery phase.  Initiate remdesivir and steroid treatment, discussed with pharmacist.  Patient was informed about the plan and she agreed.  Sinus tachycardia -Probably mainly because of worsening of her anemia and Covid infection. -Clinically looks euvolemic, PRN metoprolol for now -This morning's EKG's computer reading showed accelerated junctional rhythm but clearly there are P waves followed by QRS.  And removal of patient overnight telemetry monitoring showed persistent sinus tachycardia.  Right MCA embolic infarct -Discussed with neurology attending, stroke work-up still undergoing.  However it appears at least several risk factors can potentiate each other, chronic factor such as obesity, diabetes, hypertension, and in addition much acute issues involving the patient just had a progesterone/estrogen implant placed 57-month ago, also has Covid related hyper coagulable state. -Neurology plan to perform a saline agitated TTE to look for PFO, and no plan  to start systemic anticoagulation given no clear etiology which mentioned factors may dominant here. -Status post TPA.  Neurologically patient has been stabilized, discussed with neurology, agreed with hospitalist will take over patient care from tomorrow, mainly to treat COVID-19 pneumonia and risk factor modification.  Uncontrolled IDDM -A1C 8.6, but given her chronic anemia status, this number likely will be much a underestimate of her hyperglycemia. -Agreed with inpatient insulin coverage, and she may need insulin to go home.  HTN -Permissive hypertension as per neuro  Worsening of chronic microcytic anemia -Secondary to menorrhagia, as above, there is a risk of using hormone manipulation this patient given her other risk and recent stroke.  Encouraged her to discuss this issue with her gynecologist, patient understand and agreed. -Status post PRBC x1 -Etiology of bleeding is clear, will resume DVT prophylaxis with Lovenox subcu   I will followup again tomorrow. Please contact me if I can be of assistance in the meanwhile. Thank you for this consultation.  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  Symptoms started 2 days ago in the morning, patient noted herself that she had new onset of left-sided weakness as well as slurred speech and came to the ED.  TPA was given patient admitted to neuro ICU for the treatment and work-up for last 2 days.  MRI showed multiple right MCA embolic infarct.  Starting from 3 to 4 weeks ago, started, nonproductive cough, patient reported that before that her son also similar symptoms.  She went to tested positive for Covid on December 1.  She never received any particular treatment for Covid.  3 days ago, she was tested positive  for Covid again in the ED.  Patient  herself reported her shortness breath has been getting worse for the last week, minimum activity can trigger severe shortness of breath.  No ICU record showed patient has a tendency to desat with negative activity, and her cough has been persistent.  She denied any fever chills, no muscle aching no loss of taste no diarrhea.  Review of Systems:  14 points of review system done negative except those mentioned in the HPI   Past Medical History:  Diagnosis Date   Depression    Diabetes mellitus without complication ([place])    Hyperlipidemia    Hypertension    Hypoactive thyroid    Hypothyroid    Mental disorder    depression and PTSD   Past Surgical History:  Procedure Laterality Date   CESAREAN SECTION     Social History:  reports that she has never smoked. She has never used smokeless tobacco. She reports current alcohol use. She reports that she does not use drugs.  No Known Allergies Family History  Problem Relation Age of Onset   Diabetes Mother    Hypertension Mother    Hyperlipidemia Mother    Kidney disease Mother    Stroke Mother    Diabetes Father    Heart disease Father    Liver disease Son        biliary atresia    Asthma Son    ADD / ADHD Son    ODD Son    Asthma Son    ADD / ADHD Son    ODD Son    Cancer Paternal Aunt    Colon cancer Neg Hx     Prior to Admission medications   Medication Sig Start Date End Date Taking? Authorizing Provider  Accu-Chek FastClix Lancets MISC Use to check blood glucose twice a day 01/02/20  Yes Corum, Rex Kras, MD  acetaminophen (TYLENOL) 325 MG tablet Take 650 mg by mouth every 6 (six) hours as needed.    Yes [provider]  atorvastatin (LIPITOR) 20 MG tablet Take 1 tablet (20 mg total) by mouth daily. 05/01/17  Yes [name], MD  blood glucose meter kit and supplies KIT Dispense based on patient and insurance preference. Use up to four times daily as directed. (FOR ICD-9 250.00, 250.01).  05/31/19  Yes Corum, Rex Kras, MD  FLUoxetine (PROZAC) 20 MG capsule Take three pills each morning as directed. Patient taking differently: Take 60 mg by mouth daily. Take three pills each morning as directed. 04/14/18  Yes [name], MD  glucose blood (ACCU-CHEK GUIDE) test strip Use to check glucose twice a day 01/02/20  Yes Corum, Rex Kras, MD  hydrochlorothiazide (MICROZIDE) 12.5 MG capsule Take 1 capsule (12.5 mg total) by mouth daily. 01/02/20  Yes Corum, Rex Kras, MD  levothyroxine (SYNTHROID) 150 MCG tablet Take 1 tablet (150 mcg total) by mouth daily. 07/15/19  Yes Corum, Rex Kras, MD  metFORMIN (GLUCOPHAGE) 500 MG tablet TAKE 1 TABLET BY MOUTH WITH BREAKFAST AND 2 TABLETS WITH EVENING MEAL 01/02/20  Yes Corum, Rex Kras, MD  sitaGLIPtin (JANUVIA) 50 MG tablet Take 50 mg by mouth daily. 06/08/20 06/08/21 Yes [provider]   Physical Exam: Blood pressure 107/70, pulse (!) 122, temperature 98.3 F (36.8 C), temperature source Oral, resp. rate (!) 22, height $RemoveBe'5\' 4"'SBBdUyOzT$  (1.626 m), weight 120.4 kg, SpO2 93 %. Vitals:   07/12/20 0823 07/12/20 1150  BP: (!) 112/58 107/70  Pulse: (!) 118 (!) 122  Resp: 18 (!) 22  Temp: 97.9 F (36.6 C) 98.3 F (36.8 C)  SpO2: 91% 93%     General: Mild respiratory distress  Eyes: PERRL  ENT: Mucosa moist  Neck: Supple no JVD  Cardiovascular: RRR  Respiratory: Tachypneic, increasing respiratory effort, no crackles no wheezing  Abdomen: Soft nontender nondistended  Skin: No rash  Musculoskeletal: Normal ROM  Psychiatric: calm  Neurologic: Slight weakness on left side  Labs on Admission:  Basic Metabolic Panel: Recent Labs  Lab 07/09/20 2006 07/11/20 0034 07/12/20 0212  NA 133* 137 139  K 3.2* 3.1* 3.7  CL 96* 98 102  CO2 $Re'22 24 25  'zJJ$ GLUCOSE 151* 126* 122*  BUN $Re'8 8 7  'bcZ$ CREATININE 0.81 0.75 0.84  CALCIUM 9.3 9.3 9.2   Liver Function Tests: Recent Labs  Lab 07/09/20 2006 07/11/20 0034  AST 38 19  ALT 42 25  ALKPHOS 65 57  BILITOT  1.2 1.2  PROT 7.9 6.7  ALBUMIN 3.9 3.3*   No results for input(s): LIPASE, AMYLASE in the last 168 hours. No results for input(s): AMMONIA in the last 168 hours. CBC: Recent Labs  Lab 07/09/20 2006 07/11/20 0034 07/11/20 1358 07/12/20 0212  WBC 14.9* 9.4 10.0 8.9  NEUTROABS 10.7*  --   --   --   HGB 7.4* 6.6* 7.8* 8.0*  HCT 25.5* 22.5* 24.4* 26.1*  MCV 79.7* 78.4* 78.2* 79.8*  PLT 841* 562* 542* 516*   Cardiac Enzymes: Recent Labs  Lab 07/11/20 0034  CKTOTAL 36*   BNP: Invalid input(s): POCBNP CBG: Recent Labs  Lab 07/11/20 1221 07/11/20 1638 07/11/20 2059 07/12/20 0603 07/12/20 1113  GLUCAP 123* 122* 130* 132* 121*    Radiological Exams on Admission: CT ANGIO HEAD W OR WO CONTRAST  Result Date: 07/11/2020 CLINICAL DATA:  Stroke/TIA.  Determine embolic source. EXAM: CT ANGIOGRAPHY HEAD AND NECK TECHNIQUE: Multidetector CT imaging of the head and neck was performed using the standard protocol during bolus administration of intravenous contrast. Multiplanar CT image reconstructions and MIPs were obtained to evaluate the vascular anatomy. Carotid stenosis measurements (when applicable) are obtained utilizing NASCET criteria, using the distal internal carotid diameter as the denominator. CONTRAST:  26mL OMNIPAQUE IOHEXOL 350 MG/ML SOLN COMPARISON:  CT head December 13, 21. FINDINGS: CT HEAD FINDINGS Brain: New cortical areas of hypodensity and loss of gray differentiation in the right parietal and occipital lobes, concerning for acute or early subacute infarcts. Mild edema without substantial mass effect. No midline shift. No acute hemorrhage. No hydrocephalus. Skull: No acute fracture. Sinuses: Diffuse paranasal sinus mucosal thickening. Orbits: Unremarkable. Review of the MIP images confirms the above findings CTA NECK FINDINGS Aortic arch: Great vessel origins are patent. Right carotid system: No evidence of dissection, stenosis (50% or greater) or occlusion. Left carotid  system: No evidence of dissection, stenosis (50% or greater) or occlusion. Vertebral arteries: Right dominant. No evidence of dissection, stenosis (50% or greater) or occlusion. Skeleton: No acute fracture. Other neck: No mass or suspicious adenopathy. Upper chest: In the partially visualized lung apices there are patchy multifocal airspace opacities, which could represent edema or infection. Review of the MIP images confirms the above findings CTA HEAD FINDINGS Anterior circulation: No significant stenosis, proximal occlusion, aneurysm, or vascular malformation. Posterior circulation: No significant stenosis, proximal occlusion, aneurysm, or vascular malformation. Mild stenosis of the right P2 PCA. Venous sinuses: As permitted by contrast timing, patent. Small right transverse sinus. Review of the MIP images confirms the above findings IMPRESSION: 1. Findings  concerning for acute or early subacute infarcts in the right occipital and parietal lobes, as described above. Mild edema without substantial mass effect. MRI could further characterize if clinically indicated. 2. No evidence of large vessel occlusion or proximal hemodynamically significant stenosis in the head or neck. 3. In the partially visualized lung apices there are patchy multifocal airspace opacities, which could represent edema or infection. Recommend dedicated chest imaging to further characterize. These results will be called to the ordering clinician or representative by the Radiologist Assistant, and communication documented in the PACS or [employer]. Electronically Signed   By: Feliberto Harts MD   On: 07/11/2020 19:05   CT ANGIO NECK W OR WO CONTRAST  Result Date: 07/11/2020 CLINICAL DATA:  Stroke/TIA.  Determine embolic source. EXAM: CT ANGIOGRAPHY HEAD AND NECK TECHNIQUE: Multidetector CT imaging of the head and neck was performed using the standard protocol during bolus administration of intravenous contrast. Multiplanar CT image  reconstructions and MIPs were obtained to evaluate the vascular anatomy. Carotid stenosis measurements (when applicable) are obtained utilizing NASCET criteria, using the distal internal carotid diameter as the denominator. CONTRAST:  74mL OMNIPAQUE IOHEXOL 350 MG/ML SOLN COMPARISON:  CT head December 13, 21. FINDINGS: CT HEAD FINDINGS Brain: New cortical areas of hypodensity and loss of gray differentiation in the right parietal and occipital lobes, concerning for acute or early subacute infarcts. Mild edema without substantial mass effect. No midline shift. No acute hemorrhage. No hydrocephalus. Skull: No acute fracture. Sinuses: Diffuse paranasal sinus mucosal thickening. Orbits: Unremarkable. Review of the MIP images confirms the above findings CTA NECK FINDINGS Aortic arch: Great vessel origins are patent. Right carotid system: No evidence of dissection, stenosis (50% or greater) or occlusion. Left carotid system: No evidence of dissection, stenosis (50% or greater) or occlusion. Vertebral arteries: Right dominant. No evidence of dissection, stenosis (50% or greater) or occlusion. Skeleton: No acute fracture. Other neck: No mass or suspicious adenopathy. Upper chest: In the partially visualized lung apices there are patchy multifocal airspace opacities, which could represent edema or infection. Review of the MIP images confirms the above findings CTA HEAD FINDINGS Anterior circulation: No significant stenosis, proximal occlusion, aneurysm, or vascular malformation. Posterior circulation: No significant stenosis, proximal occlusion, aneurysm, or vascular malformation. Mild stenosis of the right P2 PCA. Venous sinuses: As permitted by contrast timing, patent. Small right transverse sinus. Review of the MIP images confirms the above findings IMPRESSION: 1. Findings concerning for acute or early subacute infarcts in the right occipital and parietal lobes, as described above. Mild edema without substantial mass  effect. MRI could further characterize if clinically indicated. 2. No evidence of large vessel occlusion or proximal hemodynamically significant stenosis in the head or neck. 3. In the partially visualized lung apices there are patchy multifocal airspace opacities, which could represent edema or infection. Recommend dedicated chest imaging to further characterize. These results will be called to the ordering clinician or representative by the Radiologist Assistant, and communication documented in the PACS or [employer]. Electronically Signed   By: Feliberto Harts MD   On: 07/11/2020 19:05   MR BRAIN WO CONTRAST  Result Date: 07/11/2020 CLINICAL DATA:  Stroke follow-up.  Status post tPA EXAM: MRI HEAD WITHOUT CONTRAST TECHNIQUE: Multiplanar, multiecho pulse sequences of the brain and surrounding structures were obtained without intravenous contrast. COMPARISON:  None. FINDINGS: Brain: Multifocal acute ischemia in the right MCA territory, predominantly in the posterior parietal lobe. No contralateral ischemia. No acute or chronic hemorrhage. Normal white matter signal,  parenchymal volume and CSF spaces. The midline structures are normal. Vascular: Major flow voids are preserved. Skull and upper cervical spine: Normal calvarium and skull base. Visualized upper cervical spine and soft tissues are normal. Sinuses/Orbits:Moderate sinus mucosal thickening diffusely. Normal orbits. IMPRESSION: Multifocal acute ischemia in the right MCA territory, predominantly in the posterior parietal lobe. No hemorrhage or mass effect. Electronically Signed   By: Ulyses Jarred M.D.   On: 07/11/2020 23:02   ECHOCARDIOGRAM LIMITED  Result Date: 07/11/2020    ECHOCARDIOGRAM LIMITED REPORT   Patient Name:   DAVETTA OLLIFF Date of Exam: 07/11/2020 Medical Rec #:  [id number]         Height:       64.0 in Accession #:    [id number]        Weight:       265.4 lb Date of Birth:  1980-12-01        BSA:          2.206 m Patient  Age:    39 years          BP:           133/66 mmHg Patient Gender: F                 HR:           91 bpm. Exam Location:  Inpatient Procedure: Limited Echo, Cardiac Doppler and Color Doppler Indications:    CVA  History:        Patient has no prior history of Echocardiogram examinations.                 Stroke; Risk Factors:Hypertension, Diabetes, Dyslipidemia and                 Obesity. COVID+.  Sonographer:    Dustin Flock Referring Phys: [place]  1. Left ventricular ejection fraction, by estimation, is 55 to 60%. The left ventricle has normal function. Left ventricular endocardial border not optimally defined to evaluate regional wall motion. Left ventricular diastolic parameters are consistent with Grade I diastolic dysfunction (impaired relaxation).  2. Right ventricular systolic function is normal. Tricuspid regurgitation signal is inadequate for assessing PA pressure.  3. The mitral valve is normal in structure. Trivial mitral valve regurgitation.  4. The aortic valve is normal in structure. FINDINGS  Left Ventricle: Left ventricular ejection fraction, by estimation, is 55 to 60%. The left ventricle has normal function. Left ventricular endocardial border not optimally defined to evaluate regional wall motion. Left ventricular diastolic parameters are consistent with Grade I diastolic dysfunction (impaired relaxation). Normal left ventricular filling pressure. Right Ventricle: Right ventricular systolic function is normal. Tricuspid regurgitation signal is inadequate for assessing PA pressure. Left Atrium: Left atrial size was not well visualized. Right Atrium: Right atrial size was not well visualized. Pericardium: There is no evidence of pericardial effusion. Mitral Valve: The mitral valve is normal in structure. Trivial mitral valve regurgitation. Tricuspid Valve: The tricuspid valve is normal in structure. Aortic Valve: The aortic valve is normal in structure. Pulmonic Valve:  The pulmonic valve was not well visualized. Aorta: Aortic root could not be assessed. IAS/Shunts: The interatrial septum appears to be lipomatous. LEFT VENTRICLE PLAX 2D LVIDd:         5.10 cm Diastology LVIDs:         3.60 cm LV e' medial:    6.42 cm/s LV PW:         1.00 cm LV E/e'  medial:  10.8 LV IVS:        1.00 cm LV e' lateral:   6.85 cm/s                        LV E/e' lateral: 10.1  RIGHT VENTRICLE RV S prime:     10.90 cm/s LEFT ATRIUM         Index LA diam:    4.00 cm 1.81 cm/m   AORTA Ao Root diam: 2.90 cm MITRAL VALVE MV Area (PHT): 8.62 cm MV Decel Time: 88 msec MV E velocity: 69.40 cm/s MV A velocity: 89.50 cm/s MV E/A ratio:  0.78 Fransico Him MD Electronically signed by Fransico Him MD Signature Date/Time: 07/11/2020/1:13:15 PM    Final     EKG: Independently reviewed.  Sinus tachycardia  Time spent: [facility] Pager [phone] 07/12/2020, 1:57 PM

## 2020-07-12 NOTE — Evaluation (Signed)
Physical Therapy Evaluation & Discharge Patient Details Name: April Carney MRN: [id number] DOB: 1981/07/25 Today's Date: 07/12/2020   History of Present Illness  April Carney is an 39 y.o. female with a PMHx of DM, HLD, HTN, hypoactive thyroid, depression and PTSD, COVID + 12/1 who arrives to the [facility] ICU in transfer from the [facility] ED for stroke care following IV tPA administration at [facility]. She presented to the ER at [facility] with L sided numbness, weakness, mild HA, and slurred speech, which improved in ED. CT of head was negative, but MRI revealed multifocal acute ischemia in the R MCA territory, predominantly in the posterior parietal lobe. NIHSS was 1 for sensory loss.  Clinical Impression  Pt presents with condition mentioned above and residual coughing from COVID. At baseline, pt is independent without AD/AE with all functional mobility and takes care of her children. Pt demonstrates WNL bilat leg sensation to light touch, dynamic proprioception, coordination, and strength this date and reports feeling like she is at her baseline level of function. She did not display any overt LOB, but mild trunk sway with coughing when standing without UE support. However, she is able to maintain her balance when her feet are placed in narrow stances, such as tandem or single leg stance. Due to her most likely being at her baseline and her safety with all functional mobility will plan to sign off on PT with no further PT services necessary at this time.     Follow Up Recommendations No PT follow up    Equipment Recommendations  None recommended by PT    Recommendations for Other Services       Precautions / Restrictions Precautions Precautions: Fall (low) Precaution Comments: contact and enteric precautions Restrictions Weight Bearing Restrictions: No      Mobility  Bed Mobility Overal bed mobility: Independent             General bed mobility comments: Pt able to perform all bed mob  safely independently.    Transfers Overall transfer level: Independent Equipment used: None             General transfer comment: Sit to stand from low EOB with no LOB or increase in trunk sway.  Ambulation/Gait Ambulation/Gait assistance: Supervision Gait Distance (Feet): 60 Feet Assistive device: None Gait Pattern/deviations: Step-through pattern;WFL(Within Functional Limits);Decreased stride length Gait velocity: reduced Gait velocity interpretation: >2.62 ft/sec, indicative of community ambulatory General Gait Details: Pt ambulates with minor lateral trunk sway, most likely due to pt coughing and feeling unwell, but no overt LOB. Supervision for safety.  Stairs            Wheelchair Mobility    Modified Rankin (Stroke Patients Only) Modified Rankin (Stroke Patients Only) Pre-Morbid Rankin Score: No symptoms Modified Rankin: No symptoms     Balance Overall balance assessment: Mild deficits observed, not formally tested (mild trunks way with coughing in standing; able to hold SLS for several seconds and tandem stance for >5 seconds eyes open no UE support without LOB)                                           Pertinent Vitals/Pain Pain Assessment: No/denies pain    Home Living Family/patient expects to be discharged to:: Private residence Living Arrangements: Spouse/significant other;Children (8  and 37 yo female children) Available Help at Discharge: Family;Available 24 hours/day (husband works  3rd shift, sons can assist physically minimally (young)) Type of Home: Mobile home Home Access: Stairs to enter Entrance Stairs-Rails: Left Entrance Stairs-Number of Steps: 3 Home Layout: One level Home Equipment: None Additional Comments: Does not work.    Prior Function Level of Independence: Independent         Comments: Pt drives.     Hand Dominance   Dominant Hand: Right    Extremity/Trunk Assessment   Upper Extremity  Assessment Upper Extremity Assessment: Defer to OT evaluation    Lower Extremity Assessment Lower Extremity Assessment: Overall WFL for tasks assessed (intact sensation, dyn proprio, coordination, and WFL MMT scores of 4 to 5 grossly bilat legs)    Cervical / Trunk Assessment Cervical / Trunk Assessment: Normal  Communication   Communication: No difficulties  Cognition Arousal/Alertness: Awake/alert Behavior During Therapy: WFL for tasks assessed/performed Overall Cognitive Status: Within Functional Limits for tasks assessed                                 General Comments: A&Ox4, pt reports feeling back at baseline cognitively and with speech.      General Comments      Exercises     Assessment/Plan    PT Assessment Patent does not need any further PT services  PT Problem List         PT Treatment Interventions      PT Goals (Current goals can be found in the Care Plan section)  Acute Rehab PT Goals Patient Stated Goal: to go home PT Goal Formulation: With patient Time For Goal Achievement: 07/14/20 Potential to Achieve Goals: Good    Frequency     Barriers to discharge        Co-evaluation               AM-PAC PT "6 Clicks" Mobility  Outcome Measure Help needed turning from your back to your side while in a flat bed without using bedrails?: None Help needed moving from lying on your back to sitting on the side of a flat bed without using bedrails?: None Help needed moving to and from a bed to a chair (including a wheelchair)?: None Help needed standing up from a chair using your arms (e.g., wheelchair or bedside chair)?: None Help needed to walk in hospital room?: A Little Help needed climbing 3-5 steps with a railing? : A Little 6 Click Score: 22    End of Session   Activity Tolerance: Patient tolerated treatment well Patient left: in bed;with call bell/phone within reach   PT Visit Diagnosis: Unsteadiness on feet (R26.81)     Time: 4128-7867 PT Time Calculation (min) (ACUTE ONLY): 23 min   Charges:   PT Evaluation $PT Eval Low Complexity: 1 Low PT Treatments $Therapeutic Activity: 8-22 mins        Raymond Gurney, PT, DPT Acute Rehabilitation Services  Pager: [phone] Office: [phone]   April Carney 07/12/2020, 10:20 AM

## 2020-07-12 NOTE — Plan of Care (Signed)

## 2020-07-12 NOTE — Evaluation (Signed)
Speech Language Pathology Evaluation Patient Details Name: April Carney MRN: [id number] DOB: February 04, 1981 Today's Date: 07/12/2020 Time: 7035-0093 SLP Time Calculation (min) (ACUTE ONLY): 15 min  Problem List:  Patient Active Problem List   Diagnosis Date Noted  . COVID-19   . Stroke (cerebrum) (HCC) 07/09/2020  . CVA (cerebral vascular accident) (HCC) 07/09/2020  . Nexplanon insertion 05/25/2020  . Anemia 08/22/2019  . Elevated liver function tests 06/02/2017  . Superficial fungus infection of skin 07/01/2016  . Essential hypertension 07/01/2016  . Diabetes mellitus (HCC) 12/12/2015  . Hyperlipidemia 12/12/2015  . Situational depression 12/11/2015  . Family h/o Biliary atresia 04/24/2015  . History of preterm delivery 04/24/2015  . History of cesarean section 04/24/2015  . Obesity 04/24/2015  . Hypothyroidism 04/24/2015   Past Medical History:  Past Medical History:  Diagnosis Date  . Depression   . Diabetes mellitus without complication (HCC)   . Hyperlipidemia   . Hypertension   . Hypoactive thyroid   . Hypothyroid   . Mental disorder    depression and PTSD   Past Surgical History:  Past Surgical History:  Procedure Laterality Date  . CESAREAN SECTION     HPI:  Pt is a 39 y.o. female with a PMHx of DM, HLD, HTN, hypoactive thyroid, depression and PTSD was transferred to the ICU from the [facility] ED for stroke following IV tPA administration at [facility]. CT head: acute intracranial hemorrhage or evidence of acute infarction. MRI brain: Multifocal acute ischemia in the right MCA territory, predominantly  in the posterior parietal lobe.   Assessment / Plan / Recommendation Clinical Impression  Pt participated in speech/language/cognition evaluation. 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 She denied any baseline or acute deficits in speech, language, or cognition. The [facility] Mental Status Examination was completed to evaluate the pt's  cognitive-linguistic skills. She achieved a score of 28/30 which is within the normal limits of 27 or more out of 30. No speech or language deficits were noted and her performance on informal cognitive-linguistic tasks was within normal limits. Further skilled SLP services are not clinically indicated at this time. Pt and nursing were educated regarding this; understanding and agreement were verbalized.    SLP Assessment  SLP Recommendation/Assessment: Patient does not need any further Speech Lanaguage Pathology Services SLP Visit Diagnosis: Cognitive communication deficit (R41.841)    Follow Up Recommendations  None    Frequency and Duration           SLP Evaluation Cognition  Overall Cognitive Status: Within Functional Limits for tasks assessed Arousal/Alertness: Awake/alert Orientation Level: Oriented X4 Attention: Focused;Sustained Focused Attention: Appears intact Sustained Attention: Appears intact Memory: Appears intact (Immediate: 4/5; delayed: 5/5) Awareness: Appears intact Problem Solving: Appears intact Executive Function: Reasoning;Sequencing;Organizing Reasoning: Appears intact Sequencing: Appears intact (Clock drawing: 2/4; pt lost points due to hands being incorrect but stated that she only uses digital clocks and is therefore not surprised by this.) Organizing: Appears intact       Comprehension  Auditory Comprehension Overall Auditory Comprehension: Appears within functional limits for tasks assessed Yes/No Questions: Within Functional Limits Conversation: Complex    Expression Expression Primary Mode of Expression: Verbal Verbal Expression Overall Verbal Expression: Appears within functional limits for tasks assessed Initiation: No impairment Level of Generative/Spontaneous Verbalization: Conversation Repetition: No impairment Naming: No impairment Pragmatics: No impairment   Oral / Motor  Oral Motor/Sensory Function Overall Oral Motor/Sensory  Function: Within functional limits Motor Speech Overall Motor Speech: Appears  within functional limits for tasks assessed Respiration: Within functional limits Phonation: Normal Resonance: Within functional limits Articulation: Within functional limitis Intelligibility: Intelligible Motor Planning: Witnin functional limits Motor Speech Errors: Not applicable   Virna Livengood I. Vear Clock, MS, CCC-SLP Acute Rehabilitation Services Office number [phone] Pager [phone]                    Scheryl Marten 07/12/2020, 3:27 PM

## 2020-07-12 NOTE — Progress Notes (Addendum)
Patient arrived to the unit via wheelchair, assigned to room 39 at 1630. Patient oriented to room, equipment, bed lowered, locked, call bell placed by patient's side.

## 2020-07-12 NOTE — Progress Notes (Signed)
TCD w/ bubble and lower extremity venous bilateral studies completed.   Please see CV Proc for preliminary results.   Jean Rosenthal, RDMS

## 2020-07-12 NOTE — Progress Notes (Signed)
STROKE TEAM PROGRESS NOTE   INTERVAL HISTORY No family at bedside.  Patient awake alert orientated, no complaints, no acute event overnight.  However persistent sinus tachycardia 120s to 130s.  Per RN, patient desats when she coughs.  Internal medicine consultation placed.  OBJECTIVE Vitals:   07/12/20 0015 07/12/20 0400 07/12/20 0823 07/12/20 1150  BP: 129/86 103/83 (!) 112/58 107/70  Pulse: 71 86 (!) 118 (!) 122  Resp: 18  18 (!) 22  Temp: 98.2 F (36.8 C) 97.9 F (36.6 C) 97.9 F (36.6 C) 98.3 F (36.8 C)  TempSrc: Oral Oral Axillary Oral  SpO2: 97% 97% 91% 93%  Weight:      Height:       CBC:  Recent Labs  Lab 07/09/20 2006 07/11/20 0034 07/11/20 1358 07/12/20 0212  WBC 14.9*   < > 10.0 8.9  NEUTROABS 10.7*  --   --   --   HGB 7.4*   < > 7.8* 8.0*  HCT 25.5*   < > 24.4* 26.1*  MCV 79.7*   < > 78.2* 79.8*  PLT 841*   < > 542* 516*   < > = values in this interval not displayed.   Basic Metabolic Panel:  Recent Labs  Lab 07/11/20 0034 07/12/20 0212  NA 137 139  K 3.1* 3.7  CL 98 102  CO2 24 25  GLUCOSE 126* 122*  BUN 8 7  CREATININE 0.75 0.84  CALCIUM 9.3 9.2   Lipid Panel:     Component Value Date/Time   CHOL 132 07/11/2020 0034   CHOL 227 (H) 10/28/2016 0927   TRIG 200 (H) 07/11/2020 0417   HDL 23 (L) 07/11/2020 0034   HDL 36 (L) 10/28/2016 0927   CHOLHDL 5.7 07/11/2020 0034   VLDL 40 07/11/2020 0034   LDLCALC 69 07/11/2020 0034   LDLCALC 91 09/25/2017 0836   HgbA1c:  Lab Results  Component Value Date   HGBA1C 8.6 (H) 07/11/2020   Urine Drug Screen:     Component Value Date/Time   LABOPIA NONE DETECTED 07/10/2020 0429   COCAINSCRNUR NONE DETECTED 07/10/2020 0429   LABBENZ NONE DETECTED 07/10/2020 0429   AMPHETMU NONE DETECTED 07/10/2020 0429   THCU NONE DETECTED 07/10/2020 0429   LABBARB NONE DETECTED 07/10/2020 0429    Alcohol Level     Component Value Date/Time   ETH <10 07/09/2020 2006    IMAGING  CT HEAD CODE STROKE WO  CONTRAST 07/09/2020 There is no acute intracranial hemorrhage or evidence of acute infarction. ASPECT score is 10.   CT ANGIO HEAD W OR WO CONTRAST CT ANGIO NECK W OR WO CONTRAST 07/11/2020 1. Findings concerning for acute or early subacute infarcts in the right occipital and parietal lobes, as described above. Mild edema without substantial mass effect. MRI could further characterize if clinically indicated. 2. No evidence of large vessel occlusion or proximal hemodynamically significant stenosis in the head or neck. 3. In the partially visualized lung apices there are patchy multifocal airspace opacities, which could represent edema or infection. Recommend dedicated chest imaging to further characterize.   MR BRAIN WO CONTRAST 07/11/2020 Multifocal acute ischemia in the right MCA territory, predominantly in the posterior parietal lobe. No hemorrhage or mass effect.   ECHOCARDIOGRAM LIMITED 07/11/2020 1. Left ventricular ejection fraction, by estimation, is 55 to 60%. The left ventricle has normal function. Left ventricular endocardial border not optimally defined to evaluate regional wall motion. Left ventricular diastolic parameters are consistent with Grade I diastolic dysfunction (impaired  relaxation).  2. Right ventricular systolic function is normal. Tricuspid regurgitation signal is inadequate for assessing PA pressure.  3. The mitral valve is normal in structure. Trivial mitral valve regurgitation.  4. The aortic valve is normal in structure.   ECG - SR rate 90 BPM. (See cardiology reading for complete details)   PHYSICAL EXAM    Temp:  [97.7 F (36.5 C)-98.3 F (36.8 C)] 98.3 F (36.8 C) (12/16 1150) Pulse Rate:  [71-122] 122 (12/16 1150) Resp:  [18-25] 22 (12/16 1150) BP: (103-146)/(56-86) 107/70 (12/16 1150) SpO2:  [91 %-98 %] 93 % (12/16 1150)  General - Well nourished, well developed, in no apparent distress.  Ophthalmologic - fundi not visualized due to  noncooperation.  Cardiovascular - Regular rhythm and tachycardia.  Mental Status -  Level of arousal and orientation to time, place, and person were intact. Language including expression, naming, repetition, comprehension was assessed and found intact. Attention span and concentration were normal. Recent and remote memory were intact. Fund of Knowledge was assessed and was intact.  Cranial Nerves II - XII - II - Visual field intact OU. III, IV, VI - Extraocular movements intact. V - Facial sensation intact bilaterally. VII - Facial movement intact bilaterally. VIII - Hearing & vestibular intact bilaterally. X - Palate elevates symmetrically. XI - Chin turning & shoulder shrug intact bilaterally. XII - Tongue protrusion intact.  Motor Strength - The patient's strength was normal in all extremities and pronator drift was absent.  Bulk was normal and fasciculations were absent.   Motor Tone - Muscle tone was assessed at the neck and appendages and was normal.  Reflexes - The patient's reflexes were symmetrical in all extremities and she had no pathological reflexes.  Sensory - Light touch, temperature/pinprick were assessed and were symmetrical.    Coordination - The patient had normal movements in the hands and feet with no ataxia or dysmetria.  Tremor was absent.  Gait and Station - deferred.   ASSESSMENT/PLAN [name] is a 39 y.o. female with history of DM, HTN, HLD, depression, PTSD, hypothyroid, recent COVID +ve on 06/27/2020, presented to ER at [facility] with LKW of 1800hr and sudden onset of left sided numbness, weakness, mild HA, and slurred speech. Deficits were improving in ED. The patient received IV t-PA at [facility] Monday 07/09/20 evening ? Time via tele-med consult Dr Rory Percy. Admitted to [facility] 12/14 Dr [name]. Hgb today 6.6 -> tx'd 2 units per Dr [name] (pt has hx of anemia) . Stroke: right MCA/PCA infarcts s/p tPA - etiology unclear, ? COVID hypercoagulopathy in the  setting of recent estrogen/progestron implant   CT Head - There is no acute intracranial hemorrhage or evidence of acute infarction. ASPECT score is 10.       CTA H&N - unremarkable, but concerning for right occipital and parietal cortical infarcts.   MRI head - multifocal R MCA posterior parietal infarcts  2D Echo EF 55-60%  LE venous doppler negative for DVT  TCD bubble study negative for PFO  Recommend 30-day cardiac event monitor as outpatient to rule out A. fib  Sars Corona Virus 2 - positive on 12/13  LDL - 69  HgbA1c - 8.6  UDS - negative  HIV neg  TSH 5.944, B12 272, RPR NR  Hypercoagulable and autoimmune work up pending  VTE prophylaxis - SCDs  No antithrombotic prior to admission, now on ASA 81. No DAPT due to severe anemia needing PRBC  Therapy recommendations:  None   Disposition:  Pending  Anemia   Hb 7.4-6.6-PRBC-7.8->8.0  Heavy periods  Low MCV and MCH  Add iron pills  FOBT pending  CBC monitoring  UTI  UA WBC more than 50  On Rocephin  Urine culture pending  COVID-19 Infection  Reported Covid + 06/27/2020 at [facility]  Current 07/09/2020 still positive for Covid test  Airborne isolation  CTA neck w/ patchy multiocal airspace opacities  D-dimer 4.12, Fibrinogen normal, ESR 48 CRP 3.3.  Intermittent coughing with desaturation  Medical hospitalist consult, appreciate help  Recent estrogen/progesterone implant  implanted 2 months ago  could contribute to her embolic stroke in the setting of Covid infection  patient will discuss with her OB/GYN after discharge  Tachycardia  Etiology unclear  EKG junctional rhythm   Medical hospitalist consult, appreciate help  Hypertension  Home BP meds: HCTZ 12.5 mg daily  Stable . Long-term BP goal normotensive  Hyperlipidemia  Home Lipid lowering medication: Lipitor 20 mg daily  LDL 69, goal < 70  Current lipid lowering medication: Lipitor 20 mg daily  Continue statin at  discharge  Diabetes  Home diabetic meds: metformin ; Januvia  HgbA1c 8.6, goal < 7.0 SSI  CBG monitoring Close PCP follow-up for better DM control  Other Stroke Risk Factors  ETOH use, advised to drink no more than 1 alcoholic beverage per day.  Obesity, Body mass index is 45.56 kg/m., recommend weight loss, diet and exercise as appropriate   Family hx stroke (mother)   Other Active Problems, Findings and Recommendations  Code status - Full code  Hypokalemia - potassium - 3.2->3.1 - supplemented  Hypothyroidism on Synthroid  Hospital day # 3  Patient condition worsened within the last 24 hours, has developed persistent tachycardia, desaturation after coughing and I consulted internal medicine. I discussed with Dr. Roosevelt Locks. I spent  35 minutes in total face-to-face time with the patient, more than 50% of which was spent in counseling and coordination of care, reviewing test results, images and medication, and discussing the diagnosis, treatment plan and potential prognosis. This patient's care requiresreview of multiple databases, neurological assessment, discussion with family, other specialists and medical decision making of high complexity.   Rosalin Hawking, MD PhD Stroke Neurology 07/12/2020 12:13 PM    To contact Stroke Continuity provider, please refer to [email or web address] After hours, contact General Neurology

## 2020-07-12 NOTE — Evaluation (Signed)
Occupational Therapy Evaluation Patient Details Name: April Carney MRN: [id number] DOB: [birth year] Today's Date: 07/12/2020    History of Present Illness 39 y.o. female presenting with symptoms of acute CVA including L sided weakness and slurred speech s/p tPA administration. Symptoms have resolved. MRI (+) acute R MCA CVA. PMHx significant for DM, HLD, HTN, thyroid disease, depression, and  PTSD.   Clinical Impression   PTA patient was living in a single-level mobile home with her spouse and 2 children and was independent with ADLs/IADLs including meal prep, homemaking, and laundry. 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 Patient currently presents at baseline level of function demonstrating bed mobility, functional mobility, and ADLs including toileting/hygiene/clothing management and LB dressing with I. Symptoms of CVA have resolved with patient demonstrating baseline speech and LUE/LLE function. Sensation intact. Patient with increased HR during bouts of coughing. RN aware. Patient does not require continued acute occupational therapy services with OT to sign off at this time. Recommendation for return home with intermittent assist from family as needed. No follow-up OT recommendations at this time.     Follow Up Recommendations  No OT follow up;Supervision - Intermittent    Equipment Recommendations  None recommended by OT    Recommendations for Other Services       Precautions / Restrictions Precautions Precautions: Fall (low) Precaution Comments: Airborne precautions Restrictions Weight Bearing Restrictions: No      Mobility Bed Mobility Overal bed mobility: Independent             General bed mobility comments: Supine <> EOB without external assist or increased time.    Transfers Overall transfer level: Independent Equipment used: None             General transfer comment: Sit to stand from EOB and from standard commode without  assist.    Balance Overall balance assessment: Mild deficits observed, not formally tested                                         ADL either performed or assessed with clinical judgement   ADL Overall ADL's : At baseline                                             Vision Baseline Vision/History: Wears glasses Wears Glasses: At all times Patient Visual Report: No change from baseline Vision Assessment?: No apparent visual deficits     Perception     Praxis      Pertinent Vitals/Pain Pain Assessment: No/denies pain     Hand Dominance Right   Extremity/Trunk Assessment Upper Extremity Assessment Upper Extremity Assessment: Overall WFL for tasks assessed   Lower Extremity Assessment Lower Extremity Assessment: Defer to PT evaluation   Cervical / Trunk Assessment Cervical / Trunk Assessment: Normal   Communication Communication Communication: No difficulties   Cognition Arousal/Alertness: Awake/alert Behavior During Therapy: WFL for tasks assessed/performed Overall Cognitive Status: Within Functional Limits for tasks assessed                                 General Comments: A&Ox4, pt reports feeling back at baseline cognitively and with speech.   General Comments  HR increasing  with bouts of coughing. RN aware.    Exercises     Shoulder Instructions      Home Living Family/patient expects to be discharged to:: Private residence Living Arrangements: Spouse/significant other;Children (8  and 37 yo female children) Available Help at Discharge: Family;Available 24 hours/day (husband works 3rd shift, sons can assist physically minimally (young)) Type of Home: Mobile home Home Access: Stairs to enter Secretary/administrator of Steps: 3 Entrance Stairs-Rails: Left Home Layout: One level     Bathroom Shower/Tub: Tub/shower unit;Curtain   Firefighter: Standard Bathroom Accessibility:  (big enough for  walker)   Home Equipment: None   Additional Comments: Does not work.      Prior Functioning/Environment Level of Independence: Independent        Comments: Independent with BADLs/IADLs without AD. Patient is a stay at home mom.        OT Problem List:        OT Treatment/Interventions:      OT Goals(Current goals can be found in the care plan section) Acute Rehab OT Goals Patient Stated Goal: To return home. OT Goal Formulation: With patient  OT Frequency:     Barriers to D/C:            Co-evaluation              AM-PAC OT "6 Clicks" Daily Activity     Outcome Measure Help from another person eating meals?: None Help from another person taking care of personal grooming?: None Help from another person toileting, which includes using toliet, bedpan, or urinal?: None Help from another person bathing (including washing, rinsing, drying)?: A Little Help from another person to put on and taking off regular upper body clothing?: None Help from another person to put on and taking off regular lower body clothing?: None 6 Click Score: 23   End of Session    Activity Tolerance: Patient tolerated treatment well Patient left: in bed;with call bell/phone within reach;with bed alarm set;with nursing/sitter in room                   Time: 1047-1100 OT Time Calculation (min): 13 min Charges:  OT General Charges $OT Visit: 1 Visit OT Evaluation $OT Eval Low Complexity: 1 Low  Gamble Enderle H. OTR/L Supplemental OT, Department of rehab services [phone]  Geisha Abernathy R H. 07/12/2020, 11:49 AM

## 2020-07-13 ENCOUNTER — Inpatient Hospital Stay (HOSPITAL_COMMUNITY): Payer: Medicaid Other

## 2020-07-13 DIAGNOSIS — U071 COVID-19: Secondary | ICD-10-CM | POA: Diagnosis not present

## 2020-07-13 DIAGNOSIS — R000 Tachycardia, unspecified: Secondary | ICD-10-CM

## 2020-07-13 DIAGNOSIS — I10 Essential (primary) hypertension: Secondary | ICD-10-CM | POA: Diagnosis not present

## 2020-07-13 DIAGNOSIS — D62 Acute posthemorrhagic anemia: Secondary | ICD-10-CM | POA: Diagnosis not present

## 2020-07-13 DIAGNOSIS — I2609 Other pulmonary embolism with acute cor pulmonale: Secondary | ICD-10-CM | POA: Diagnosis not present

## 2020-07-13 DIAGNOSIS — E7800 Pure hypercholesterolemia, unspecified: Secondary | ICD-10-CM | POA: Diagnosis not present

## 2020-07-13 DIAGNOSIS — E1165 Type 2 diabetes mellitus with hyperglycemia: Secondary | ICD-10-CM | POA: Diagnosis not present

## 2020-07-13 DIAGNOSIS — I63411 Cerebral infarction due to embolism of right middle cerebral artery: Secondary | ICD-10-CM | POA: Diagnosis not present

## 2020-07-13 DIAGNOSIS — D509 Iron deficiency anemia, unspecified: Secondary | ICD-10-CM | POA: Diagnosis not present

## 2020-07-13 DIAGNOSIS — N390 Urinary tract infection, site not specified: Secondary | ICD-10-CM | POA: Diagnosis not present

## 2020-07-13 LAB — CBC WITH DIFFERENTIAL/PLATELET
Abs Immature Granulocytes: 0.12 10*3/uL — ABNORMAL HIGH (ref 0.00–0.07)
Basophils Absolute: 0 10*3/uL (ref 0.0–0.1)
Basophils Relative: 0 %
Eosinophils Absolute: 0 10*3/uL (ref 0.0–0.5)
Eosinophils Relative: 0 %
HCT: 27.1 % — ABNORMAL LOW (ref 36.0–46.0)
Hemoglobin: 8 g/dL — ABNORMAL LOW (ref 12.0–15.0)
Immature Granulocytes: 2 %
Lymphocytes Relative: 14 %
Lymphs Abs: 1.1 10*3/uL (ref 0.7–4.0)
MCH: 24.9 pg — ABNORMAL LOW (ref 26.0–34.0)
MCHC: 29.5 g/dL — ABNORMAL LOW (ref 30.0–36.0)
MCV: 84.4 fL (ref 80.0–100.0)
Monocytes Absolute: 0.1 10*3/uL (ref 0.1–1.0)
Monocytes Relative: 2 %
Neutro Abs: 6.5 10*3/uL (ref 1.7–7.7)
Neutrophils Relative %: 82 %
Platelets: 371 10*3/uL (ref 150–400)
RBC: 3.21 MIL/uL — ABNORMAL LOW (ref 3.87–5.11)
RDW: 16.7 % — ABNORMAL HIGH (ref 11.5–15.5)
WBC: 7.8 10*3/uL (ref 4.0–10.5)
nRBC: 0.3 % — ABNORMAL HIGH (ref 0.0–0.2)

## 2020-07-13 LAB — HEPARIN LEVEL (UNFRACTIONATED): Heparin Unfractionated: <0.1 IU/mL — ABNORMAL LOW (ref 0.30–0.70)

## 2020-07-13 LAB — HOMOCYSTEINE: Homocysteine: 13.9 umol/L (ref 0.0–14.5)

## 2020-07-13 LAB — CARDIOLIPIN ANTIBODIES, IGG, IGM, IGA
Anticardiolipin IgA: <9 APL U/mL (ref 0–11)
Anticardiolipin IgG: <9 GPL U/mL (ref 0–14)
Anticardiolipin IgM: 10 MPL U/mL (ref 0–12)

## 2020-07-13 LAB — LUPUS ANTICOAGULANT PANEL
DRVVT: 46.3 s (ref 0.0–47.0)
PTT Lupus Anticoagulant: 37.2 s (ref 0.0–51.9)

## 2020-07-13 LAB — COMPREHENSIVE METABOLIC PANEL
ALT: 22 U/L (ref 0–44)
AST: 15 U/L (ref 15–41)
Albumin: 3.2 g/dL — ABNORMAL LOW (ref 3.5–5.0)
Alkaline Phosphatase: 53 U/L (ref 38–126)
Anion gap: 11 (ref 5–15)
BUN: 8 mg/dL (ref 6–20)
CO2: 21 mmol/L — ABNORMAL LOW (ref 22–32)
Calcium: 9.4 mg/dL (ref 8.9–10.3)
Chloride: 104 mmol/L (ref 98–111)
Creatinine, Ser: 0.71 mg/dL (ref 0.44–1.00)
GFR, Estimated: >60 mL/min (ref >60)
Glucose, Bld: 272 mg/dL — ABNORMAL HIGH (ref 70–99)
Potassium: 4.5 mmol/L (ref 3.5–5.1)
Sodium: 136 mmol/L (ref 135–145)
Total Bilirubin: 0.7 mg/dL (ref 0.3–1.2)
Total Protein: 6.8 g/dL (ref 6.5–8.1)

## 2020-07-13 LAB — GLUCOSE, CAPILLARY
Glucose-Capillary: 256 mg/dL — ABNORMAL HIGH (ref 70–99)
Glucose-Capillary: 298 mg/dL — ABNORMAL HIGH (ref 70–99)
Glucose-Capillary: 298 mg/dL — ABNORMAL HIGH (ref 70–99)
Glucose-Capillary: 275 mg/dL — ABNORMAL HIGH (ref 70–99)

## 2020-07-13 LAB — MAGNESIUM: Magnesium: 2 mg/dL (ref 1.7–2.4)

## 2020-07-13 LAB — C-REACTIVE PROTEIN: CRP: 3 mg/dL — ABNORMAL HIGH (ref <1.0)

## 2020-07-13 LAB — PHOSPHORUS: Phosphorus: 2.9 mg/dL (ref 2.5–4.6)

## 2020-07-13 LAB — D-DIMER, QUANTITATIVE: D-Dimer, Quant: 6.1 ug/mL-FEU — ABNORMAL HIGH (ref 0.00–0.50)

## 2020-07-13 LAB — FERRITIN: Ferritin: 14 ng/mL (ref 11–307)

## 2020-07-13 LAB — PROCALCITONIN: Procalcitonin: <0.1 ng/mL

## 2020-07-13 MED ORDER — METOPROLOL TARTRATE 12.5 MG HALF TABLET
12.5000 mg | ORAL_TABLET | Freq: Two times a day (BID) | ORAL | Status: DC
  Administered 2020-07-13 – 2020-07-15 (×4): 12.5 mg via ORAL
  Filled 2020-07-13 (×6): qty 1

## 2020-07-13 MED ORDER — ENOXAPARIN SODIUM 60 MG/0.6ML ~~LOC~~ SOLN: 60.0000 mg | SUBCUTANEOUS | Status: DC

## 2020-07-13 MED ORDER — METOPROLOL TARTRATE 25 MG PO TABS
25.0000 mg | ORAL_TABLET | Freq: Two times a day (BID) | ORAL | Status: DC
  Administered 2020-07-13: 09:00:00 25 mg via ORAL
  Filled 2020-07-13: qty 1

## 2020-07-13 MED ORDER — HEPARIN BOLUS VIA INFUSION
2000.0000 [IU] | Freq: Once | INTRAVENOUS | Status: AC
  Administered 2020-07-13: 16:00:00 2000 [IU] via INTRAVENOUS
  Filled 2020-07-13: qty 2000

## 2020-07-13 MED ORDER — IOHEXOL 350 MG/ML SOLN
100.0000 mL | Freq: Once | INTRAVENOUS | Status: AC | PRN
  Administered 2020-07-13: 14:00:00 100 mL via INTRAVENOUS

## 2020-07-13 MED ORDER — SODIUM CHLORIDE 0.9 % IV SOLN
510.0000 mg | Freq: Once | INTRAVENOUS | Status: AC
  Administered 2020-07-13: 20:00:00 510 mg via INTRAVENOUS
  Filled 2020-07-13 (×2): qty 17

## 2020-07-13 MED ORDER — HEPARIN (PORCINE) 25000 UT/250ML-% IV SOLN
2300.0000 [IU]/h | INTRAVENOUS | Status: DC
  Administered 2020-07-13: 17:00:00 1300 [IU]/h via INTRAVENOUS
  Filled 2020-07-13 (×3): qty 250

## 2020-07-13 NOTE — Progress Notes (Signed)
STROKE TEAM PROGRESS NOTE   INTERVAL HISTORY RN is at bedside.  Patient awake alert orientated, sitting in bed, still has tachycardia 110s to 120s.  Intervention on board, has CTA chest found to have bilateral PE, started on heparin IV.  OBJECTIVE Vitals:   07/12/20 1534 07/12/20 2007 07/12/20 2304 07/13/20 0320  BP: 119/68 110/73 118/87 129/85  Pulse: (!) 116 (!) 117 (!) 117 (!) 107  Resp: _0 Temp: 98.5 F (36.9 C) 98 F (36.7 C) 97.9 F (36.6 C) 98.5 F (36.9 C)  TempSrc: Axillary Oral Oral Oral  SpO2: 95% 92% 92% 90%  Weight:      Height:       CBC:  Recent Labs  Lab 07/09/20 2006 07/11/20 0034 07/11/20 1358 07/12/20 0212  WBC 14.9*   < > 10.0 8.9  NEUTROABS 10.7*  --   --   --   HGB 7.4*   < > 7.8* 8.0*  HCT 25.5*   < > 24.4* 26.1*  MCV 79.7*   < > 78.2* 79.8*  PLT 841*   < > 542* 516*   < > = values in this interval not displayed.   Basic Metabolic Panel:  Recent Labs  Lab 07/11/20 0034 07/12/20 0154 07/12/20 0212  NA 137  --  139  K 3.1*  --  3.7  CL 98  --  102  CO2 24  --  25  GLUCOSE 126*  --  122*  BUN 8  --  7  CREATININE 0.75  --  0.84  CALCIUM 9.3  --  9.2  MG  --  2.1  --   PHOS  --  3.6  --    Lipid Panel:     Component Value Date/Time   CHOL 132 07/11/2020 0034   CHOL 227 (H) 10/28/2016 0927   TRIG 200 (H) 07/11/2020 0417   HDL 23 (L) 07/11/2020 0034   HDL 36 (L) 10/28/2016 0927   CHOLHDL 5.7 07/11/2020 0034   VLDL 40 07/11/2020 0034   LDLCALC 69 07/11/2020 0034   LDLCALC 91 09/25/2017 0836   HgbA1c:  Lab Results  Component Value Date   HGBA1C 8.6 (H) 07/11/2020   Urine Drug Screen:     Component Value Date/Time   LABOPIA NONE DETECTED 07/10/2020 0429   COCAINSCRNUR NONE DETECTED 07/10/2020 0429   LABBENZ NONE DETECTED 07/10/2020 0429   AMPHETMU NONE DETECTED 07/10/2020 0429   THCU NONE DETECTED 07/10/2020 0429   LABBARB NONE DETECTED 07/10/2020 0429    Alcohol Level     Component Value Date/Time   ETH <10  07/09/2020 2006    IMAGING  CT HEAD CODE STROKE WO CONTRAST 07/09/2020 There is no acute intracranial hemorrhage or evidence of acute infarction. ASPECT score is 10.   CT ANGIO HEAD W OR WO CONTRAST CT ANGIO NECK W OR WO CONTRAST 07/11/2020 1. Findings concerning for acute or early subacute infarcts in the right occipital and parietal lobes, as described above. Mild edema without substantial mass effect. MRI could further characterize if clinically indicated. 2. No evidence of large vessel occlusion or proximal hemodynamically significant stenosis in the head or neck. 3. In the partially visualized lung apices there are patchy multifocal airspace opacities, which could represent edema or infection. Recommend dedicated chest imaging to further characterize.   MR BRAIN WO CONTRAST 07/11/2020 Multifocal acute ischemia in the right MCA territory, predominantly in the posterior parietal lobe. No hemorrhage or mass effect.   ECHOCARDIOGRAM LIMITED  07/11/2020 1. Left ventricular ejection fraction, by estimation, is 55 to 60%. The left ventricle has normal function. Left ventricular endocardial border not optimally defined to evaluate regional wall motion. Left ventricular diastolic parameters are consistent with Grade I diastolic dysfunction (impaired relaxation).  2. Right ventricular systolic function is normal. Tricuspid regurgitation signal is inadequate for assessing PA pressure.  3. The mitral valve is normal in structure. Trivial mitral valve regurgitation.  4. The aortic valve is normal in structure.   ECG - SR rate 90 BPM. (See cardiology reading for complete details)   PHYSICAL EXAM    Temp:  [97.9 F (36.6 C)-98.6 F (37 C)] 98.5 F (36.9 C) (12/17 0320) Pulse Rate:  [107-123] 107 (12/17 0320) Resp:  [18-22] 18 (12/17 0320) BP: (107-129)/(58-87) 129/85 (12/17 0320) SpO2:  [90 %-95 %] 90 % (12/17 0320)  General - Well nourished, well developed, in no apparent  distress.  Ophthalmologic - fundi not visualized due to noncooperation.  Cardiovascular - Regular rhythm and tachycardia.  Mental Status -  Level of arousal and orientation to time, place, and person were intact. Language including expression, naming, repetition, comprehension was assessed and found intact. Attention span and concentration were normal. Recent and remote memory were intact. Fund of Knowledge was assessed and was intact.  Cranial Nerves II - XII - II - Visual field intact OU. III, IV, VI - Extraocular movements intact. V - Facial sensation intact bilaterally. VII - Facial movement intact bilaterally. VIII - Hearing & vestibular intact bilaterally. X - Palate elevates symmetrically. XI - Chin turning & shoulder shrug intact bilaterally. XII - Tongue protrusion intact.  Motor Strength - The patient's strength was normal in all extremities and pronator drift was absent.  Bulk was normal and fasciculations were absent.   Motor Tone - Muscle tone was assessed at the neck and appendages and was normal.  Reflexes - The patient's reflexes were symmetrical in all extremities and she had no pathological reflexes.  Sensory - Light touch, temperature/pinprick were assessed and were symmetrical.    Coordination - The patient had normal movements in the hands and feet with no ataxia or dysmetria.  Tremor was absent.  Gait and Station - deferred.   ASSESSMENT/PLAN [name] is a 39 y.o. female with history of DM, HTN, HLD, depression, PTSD, hypothyroid, recent COVID +ve on 06/27/2020, presented to ER at [facility] with LKW of 1800hr and sudden onset of left sided numbness, weakness, mild HA, and slurred speech. Deficits were improving in ED. The patient received IV [facility]-PA [facility] Monday 07/09/20 evening ? Time via tele-med consult Dr Rory Percy. Ad[facility] to [facility] 12/14 Dr [name]. Hgb today 6.6 -> tx'd 2 units per Dr [name] (pt has hx of anemia) . Stroke: right MCA/PCA infarcts s/p  tPA - etiology unclear, ? COVID hypercoagulopathy in the setting of recent estrogen/progestron implant   CT Head - There is no acute intracranial hemorrhage or evidence of acute infarction. ASPECT score is 10.       CTA H&N - unremarkable, but concerning for right occipital and parietal cortical infarcts.   MRI head - multifocal R MCA posterior parietal infarcts  2D Echo EF 55-60%  LE venous doppler negative for DVT  TCD bubble study negative for PFO  [employer] Virus 2 - positive on 12/13  LDL - 69  HgbA1c - 8.6  UDS - negative  HIV neg  TSH 5.944, B12 272, RPR NR  Hypercoagulable and autoimmune work up pending  VTE  prophylaxis - SCDs  No antithrombotic prior to admission, now on heparin IV.  Therapy recommendations:  None   Disposition:  Pending  Bilateral PE Sinus tachycardia  HR 110-120s  TTE EF 55 to 60%, right ventricular systolic function normal  LE venous Doppler no DVT  TCD bubble study no PFO  CTA chest - acute lateral PE with evidence of significant pulmonary hypertension and right heart strain  Intervention on board  Started on heparin IV  Anemia   Hb 7.4-6.6-PRBC-7.8->8.0  Heavy periods  Low MCV and MCH  Add iron pills  FOBT pending  CBC monitoring  UTI  UA WBC more than 50  Off Rocephin  Urine culture pending  COVID-19 Infection  Reported Covid + 06/27/2020 at [facility]  Current 07/09/2020 still positive for Covid test  Airborne isolation  CTA neck w/ patchy multiocal airspace opacities  D-dimer 4.12, Fibrinogen normal, ESR 48 CRP 3.3.  Intermittent coughing with desaturation  Internal medicine on board  On prednisone  Recent estrogen/progesterone implant  implanted 2 months ago  could contribute to her embolic stroke in the setting of Covid infection  patient will discuss with her OB/GYN after discharge  Hypertension  Home BP meds: HCTZ 12.5 mg daily  Stable . Long-term BP goal  normotensive  Hyperlipidemia  Home Lipid lowering medication: Lipitor 20 mg daily  LDL 69, goal < 70  Current lipid lowering medication: Lipitor 20 mg daily  Continue statin at discharge  Diabetes  Home diabetic meds: metformin ; Januvia  HgbA1c 8.6, goal < 7.0 SSI  CBG monitoring Close PCP follow-up for better DM control  Other Stroke Risk Factors  ETOH use, advised to drink no more than 1 alcoholic beverage per day.  Obesity, Body mass index is 45.56 kg/m., recommend weight loss, diet and exercise as appropriate   Family hx stroke (mother)   Other Active Problems, Findings and Recommendations  Code status - Full code  Hypokalemia - potassium - 3.2->3.1 - supplemented  Hypothyroidism on [facility]  Hospital day # 4  Neurology will sign off. Please call with questions. Pt will follow up with stroke clinic Dr. Leonie Man at [facility] in about 4 weeks. Thanks for the consult.  Rosalin Hawking, MD PhD Stroke Neurology 07/13/2020 9:43 PM   To contact Stroke Continuity provider, please refer to [email or web address] After hours, contact General Neurology

## 2020-07-13 NOTE — Progress Notes (Addendum)
ME                                                                                                                                                                                                           Patient Demographics:    April Carney, is a 39 y.o. female, DOB - 1980/11/07, FOY:[id number]  Outpatient Primary MD for the patient is Patient, No Pcp Per   Admit date - 07/09/2020   LOS - 4  Chief Complaint  Patient presents with  . Code Stroke       Brief Narrative: Patient is a 40 y.o. 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  Hospitalist service consulted on 12/16 for COVID-19 infection, sinus tachycardia.  Per patient-she and her son got diagnosed with COVID-19 infection on 12/1-she had significant cough and URI symptoms then.  Over the course of the next few weeks-her URI symptoms have essentially resolved-and her cough is now only minimal.    Subjective:    April Carney today feels much better-she only has very minimal cough when she walks.  At rest she is asymptomatic.  She was ambulated in the room by the family-pulse ox never dropped below 92%.  She had a very minimal cough when she got back to bed.  Her heart rate was in the 130s-140s with ambulation.   Assessment  & Plan :   COVID-19 viral pneumonia: Per patient report-her cough/URI symptoms have significantly improved compared to how she was on initial diagnosis on 12/1.  She does not have shortness of breath with ambulation-she is relatively asymptomatic currently-with occasional cough.  She probably has some residual pneumonia/inflammation on imaging studies-and really does not require Remdesivir this far out from her initial diagnosis.  I will go and stop Remdesivir-she may benefit from a few days of tapering steroids  (start at 40 mg-decrease by 10 mg daily until off)   I asked the patient to see if she can call the COVID-19 testing center-and get [place] a hard copy of the report-given that she is relatively asymptomatic-I do not think she requires more than 14 days of isolation.  She will benefit from her PCP repeating two-view chest x-ray or CT chest in around 4 to 6 weeks to document resolution of pneumonia.  Elevated D-dimer: Probably  due to CVA with TPA administration-probable some residual COVID-19 related inflammation.  Lower extremity Dopplers negative-not hypoxic-low threshold to suspect PE-but will go ahead and get a CTA chest not only to make sure she does not have a PE but also to evaluate the lung parenchyma.  Sinus tachycardia: She does not have tachycardia at rest-her heart rate did go to the 130s this morning when I ambulated her-tachycardia seems to be exclusively with exertion-suspect this is multifactorial-mostly from anemia with some contributions from fatigue/tiredness related to COVID-19 infection and CVA.  Start low-dose metoprolol-suspect that this will improve with time-improvement in her hemoglobin, as deconditioning improves.  2D echocardiogram done this admission as part of CVA work-showed preserved EF.  Asymptomatic bacteriuria: No UTI symptoms-has completed 3 days of Rocephin in case she had UTI.  Stop Rocephin-does not require any further antimicrobial therapy.  Iron deficiency anemia due to longstanding menorrhagia: Continue iron supplementation-we will discuss with pharmacy if patient can qualify for IV iron.  She needs outpatient follow-up with GYN-per patient-her menstrual.  Per patient her menstrual.  Can last for approximately 15 days at times.  Acute CVA: Defer to nephrology/primary team  Rest of the issues defer to primary team.  Diet :  Diet Order            Diet Heart Room service appropriate? Yes; Fluid consistency: Thin  Diet effective now                   Antimicorbials  :    Anti-infectives (From admission, onward)   Start     Dose/Rate Route Frequency Ordered Stop   07/13/20 1000  remdesivir 100 mg in sodium chloride 0.9 % 100 mL IVPB  Status:  Discontinued       "Followed by" Linked Group Details   100 mg 200 mL/hr over 30 Minutes Intravenous Daily 07/12/20 1303 07/13/20 0732   07/12/20 1400  remdesivir 200 mg in sodium chloride 0.9% 250 mL IVPB       "Followed by" Linked Group Details   200 mg 580 mL/hr over 30 Minutes Intravenous Once 07/12/20 1303 07/12/20 1604   07/11/20 0130  cefTRIAXone (ROCEPHIN) 1 g in sodium chloride 0.9 % 100 mL IVPB  Status:  Discontinued        1 g 200 mL/hr over 30 Minutes Intravenous Daily at bedtime 07/11/20 0040 07/13/20 0737      Inpatient Medications  Scheduled Meds: . sodium chloride   Intravenous Once  . sodium chloride   Intravenous Once  . aspirin EC  81 mg Oral Daily  . atorvastatin  20 mg Oral q1800  . Chlorhexidine Gluconate Cloth  6 each Topical Daily  . enoxaparin (LOVENOX) injection  60 mg Subcutaneous Q24H  . ferrous sulfate  325 mg Oral TID WC  . FLUoxetine  60 mg Oral Daily  . insulin aspart  0-20 Units Subcutaneous TID WC  . insulin aspart  6 Units Subcutaneous TID WC  . levothyroxine  150 mcg Oral Daily  . metoprolol tartrate  25 mg Oral BID  . pantoprazole  40 mg Oral Daily   Continuous Infusions: PRN Meds:.acetaminophen **OR** acetaminophen (TYLENOL) oral liquid 160 mg/5 mL **OR** acetaminophen, guaiFENesin-dextromethorphan, metoprolol tartrate, senna-docusate   Time Spent in minutes  25  See all Orders from today for further details   Jeoffrey Massed M.D on 07/13/2020 at 10:40 AM  To page go to [email or web address] - use universal password  [facility] -  Office  [phone]  Objective:   Vitals:   07/12/20 2007 07/12/20 2304 07/13/20 0320 07/13/20 0747  BP: 110/73 118/87 129/85 130/87  Pulse: (!) 117 (!) 117 (!) 107 (!) 110  Resp: 18 18 18 20    Temp: 98 F (36.7 C) 97.9 F (36.6 C) 98.5 F (36.9 C) 98.1 F (36.7 C)  TempSrc: Oral Oral Oral Oral  SpO2: 92% 92% 90% 94%  Weight:      Height:        Wt Readings from Last 3 Encounters:  07/09/20 120.4 kg  05/25/20 129.9 kg  05/14/20 129.7 kg     Intake/Output Summary (Last 24 hours) at 07/13/2020 1040 Last data filed at 07/12/2020 1700 Gross per 24 hour  Intake 370 ml  Output --  Net 370 ml     Physical Exam Gen Exam:Alert awake-not in any distress HEENT:atraumatic, normocephalic Chest: B/L clear to auscultation anteriorly CVS:S1S2 regular Abdomen:soft non tender, non distended Extremities:no edema Neurology: Non focal Skin: no rash   Data Review:    CBC Recent Labs  Lab 07/09/20 2006 07/11/20 0034 07/11/20 1358 07/12/20 0212 07/13/20 0409  WBC 14.9* 9.4 10.0 8.9 7.8  HGB 7.4* 6.6* 7.8* 8.0* 8.0*  HCT 25.5* 22.5* 24.4* 26.1* 27.1*  PLT 841* 562* 542* 516* 371  MCV 79.7* 78.4* 78.2* 79.8* 84.4  MCH 23.1* 23.0* 25.0* 24.5* 24.9*  MCHC 29.0* 29.3* 32.0 30.7 29.5*  RDW 15.4 15.4 15.6* 15.8* 16.7*  LYMPHSABS 2.5  --   --   --  1.1  MONOABS 0.9  --   --   --  0.1  EOSABS 0.3  --   --   --  0.0  BASOSABS 0.0  --   --   --  0.0    Chemistries  Recent Labs  Lab 07/09/20 2006 07/11/20 0034 07/12/20 0154 07/12/20 0212 07/13/20 0409  NA 133* 137  --  139 136  K 3.2* 3.1*  --  3.7 4.5  CL 96* 98  --  102 104  CO2 22 24  --  25 21*  GLUCOSE 151* 126*  --  122* 272*  BUN 8 8  --  7 8  CREATININE 0.81 0.75  --  0.84 0.71  CALCIUM 9.3 9.3  --  9.2 9.4  MG  --   --  2.1  --  2.0  AST 38 19  --   --  15  ALT 42 25  --   --  22  ALKPHOS 65 57  --   --  53  BILITOT 1.2 1.2  --   --  0.7   ------------------------------------------------------------------------------------------------------------------ Recent Labs    07/11/20 0034 07/11/20 0417  CHOL 132  --   HDL 23*  --   LDLCALC 69  --   TRIG 200* 200*  CHOLHDL 5.7  --     Lab  Results  Component Value Date   HGBA1C 8.6 (H) 07/11/2020   ------------------------------------------------------------------------------------------------------------------ Recent Labs    07/12/20 0212  TSH 5.944*   ------------------------------------------------------------------------------------------------------------------ Recent Labs    07/12/20 0212 07/13/20 0409  VITAMINB12 272  --   FERRITIN  --  14    Coagulation profile Recent Labs  Lab 07/09/20 2006 07/11/20 0034  INR 1.1 1.1    Recent Labs    07/12/20 0212 07/13/20 0409  DDIMER 4.12* 6.10*    Cardiac Enzymes No results for input(s): CKMB, TROPONINI, MYOGLOBIN in the last 168 hours.  Invalid input(s): CK ------------------------------------------------------------------------------------------------------------------ No results found for: BNP  Micro Results Recent Results (from the past 240 hour(s))  Resp Panel by RT-PCR (Flu A&B, Covid) Nasopharyngeal Swab     Status: Abnormal   Collection Time: 07/09/20  8:25 PM   Specimen: Nasopharyngeal Swab; Nasopharyngeal(NP) swabs in vial transport medium  Result Value Ref Range Status   SARS Coronavirus 2 by RT PCR POSITIVE (A) NEGATIVE Final    Comment: CRITICAL RESULT CALLED TO, READ BACK BY AND VERIFIED WITH: T EASTER AT 2151 ON 07/09/2020 BY MOSLEY,J (NOTE) SARS-CoV-2 target nucleic acids are DETECTED.  The SARS-CoV-2 RNA is generally detectable in upper respiratory specimens during the acute phase of infection. Positive results are indicative of the presence of the identified virus, but do not rule out bacterial infection or co-infection with other pathogens not detected by the test. Clinical correlation with patient history and other diagnostic information is necessary to determine patient infection status. The expected result is Negative.  Fact Sheet for Patients: BloggerCourse.com  Fact Sheet for Healthcare  Providers: SeriousBroker.it  This test is not yet approved or cleared by the [place] FDA and  has been authorized for detection and/or diagnosis of SARS-CoV-2 by FDA under an Emergency Use Authorization (EUA).  This EUA will remain in effect (meanin g this test can be used) for the duration of  the COVID-19 declaration under Section 564(b)(1) of the Act, 21 U.S.C. section 360bbb-3(b)(1), unless the authorization is terminated or revoked sooner.     Influenza A by PCR NEGATIVE NEGATIVE Final   Influenza B by PCR NEGATIVE NEGATIVE Final    Comment: (NOTE) The Xpert Xpress SARS-CoV-2/FLU/RSV plus assay is intended as an aid in the diagnosis of influenza from Nasopharyngeal swab specimens and should not be used as a sole basis for treatment. Nasal washings and aspirates are unacceptable for Xpert Xpress SARS-CoV-2/FLU/RSV testing.  Fact Sheet for Patients: BloggerCourse.com  Fact Sheet for Healthcare Providers: SeriousBroker.it  This test is not yet approved or cleared by the [place] FDA and has been authorized for detection and/or diagnosis of SARS-CoV-2 by FDA under an Emergency Use Authorization (EUA). This EUA will remain in effect (meaning this test can be used) for the duration of the COVID-19 declaration under Section 564(b)(1) of the Act, 21 U.S.C. section 360bbb-3(b)(1), unless the authorization is terminated or revoked.  Performed at [facility], [address], [place], [place] [address]   MRSA PCR Screening     Status: None   Collection Time: 07/10/20 11:32 AM   Specimen: Nasopharyngeal  Result Value Ref Range Status   MRSA by PCR NEGATIVE NEGATIVE Final    Comment:        The GeneXpert MRSA Assay (FDA approved for NASAL specimens only), is one component of a comprehensive MRSA colonization surveillance program. It is not intended to diagnose MRSA infection nor to guide  or monitor treatment for MRSA infections. Performed at [facility], [address], [place], [place] [address]     Radiology Reports CT ANGIO HEAD W OR WO CONTRAST  Result Date: 07/11/2020 CLINICAL DATA:  Stroke/TIA.  Determine embolic source. EXAM: CT ANGIOGRAPHY HEAD AND NECK TECHNIQUE: Multidetector CT imaging of the head and neck was performed using the standard protocol during bolus administration of intravenous contrast. Multiplanar CT image reconstructions and MIPs were obtained to evaluate the vascular anatomy. Carotid stenosis measurements (when applicable) are obtained utilizing NASCET criteria, using the distal internal carotid diameter as the denominator. CONTRAST:  24mL OMNIPAQUE IOHEXOL 350 MG/ML SOLN COMPARISON:  CT head December 13,  21. FINDINGS: CT HEAD FINDINGS Brain: New cortical areas of hypodensity and loss of gray differentiation in the right parietal and occipital lobes, concerning for acute or early subacute infarcts. Mild edema without substantial mass effect. No midline shift. No acute hemorrhage. No hydrocephalus. Skull: No acute fracture. Sinuses: Diffuse paranasal sinus mucosal thickening. Orbits: Unremarkable. Review of the MIP images confirms the above findings CTA NECK FINDINGS Aortic arch: Great vessel origins are patent. Right carotid system: No evidence of dissection, stenosis (50% or greater) or occlusion. Left carotid system: No evidence of dissection, stenosis (50% or greater) or occlusion. Vertebral arteries: Right dominant. No evidence of dissection, stenosis (50% or greater) or occlusion. Skeleton: No acute fracture. Other neck: No mass or suspicious adenopathy. Upper chest: In the partially visualized lung apices there are patchy multifocal airspace opacities, which could represent edema or infection. Review of the MIP images confirms the above findings CTA HEAD FINDINGS Anterior circulation: No significant stenosis, proximal occlusion, aneurysm, or  vascular malformation. Posterior circulation: No significant stenosis, proximal occlusion, aneurysm, or vascular malformation. Mild stenosis of the right P2 PCA. Venous sinuses: As permitted by contrast timing, patent. Small right transverse sinus. Review of the MIP images confirms the above findings IMPRESSION: 1. Findings concerning for acute or early subacute infarcts in the right occipital and parietal lobes, as described above. Mild edema without substantial mass effect. MRI could further characterize if clinically indicated. 2. No evidence of large vessel occlusion or proximal hemodynamically significant stenosis in the head or neck. 3. In the partially visualized lung apices there are patchy multifocal airspace opacities, which could represent edema or infection. Recommend dedicated chest imaging to further characterize. These results will be called to the ordering clinician or representative by the Radiologist Assistant, and communication documented in the PACS or [employer] Dashboard. Electronically Signed   By: Feliberto HartsFrederick S Jones MD   On: 07/11/2020 19:05   CT ANGIO NECK W OR WO CONTRAST  Result Date: 07/11/2020 CLINICAL DATA:  Stroke/TIA.  Determine embolic source. EXAM: CT ANGIOGRAPHY HEAD AND NECK TECHNIQUE: Multidetector CT imaging of the head and neck was performed using the standard protocol during bolus administration of intravenous contrast. Multiplanar CT image reconstructions and MIPs were obtained to evaluate the vascular anatomy. Carotid stenosis measurements (when applicable) are obtained utilizing NASCET criteria, using the distal internal carotid diameter as the denominator. CONTRAST:  75mL OMNIPAQUE IOHEXOL 350 MG/ML SOLN COMPARISON:  CT head December 13, 21. FINDINGS: CT HEAD FINDINGS Brain: New cortical areas of hypodensity and loss of gray differentiation in the right parietal and occipital lobes, concerning for acute or early subacute infarcts. Mild edema without substantial mass  effect. No midline shift. No acute hemorrhage. No hydrocephalus. Skull: No acute fracture. Sinuses: Diffuse paranasal sinus mucosal thickening. Orbits: Unremarkable. Review of the MIP images confirms the above findings CTA NECK FINDINGS Aortic arch: Great vessel origins are patent. Right carotid system: No evidence of dissection, stenosis (50% or greater) or occlusion. Left carotid system: No evidence of dissection, stenosis (50% or greater) or occlusion. Vertebral arteries: Right dominant. No evidence of dissection, stenosis (50% or greater) or occlusion. Skeleton: No acute fracture. Other neck: No mass or suspicious adenopathy. Upper chest: In the partially visualized lung apices there are patchy multifocal airspace opacities, which could represent edema or infection. Review of the MIP images confirms the above findings CTA HEAD FINDINGS Anterior circulation: No significant stenosis, proximal occlusion, aneurysm, or vascular malformation. Posterior circulation: No significant stenosis, proximal occlusion, aneurysm, or vascular malformation. Mild stenosis  of the right P2 PCA. Venous sinuses: As permitted by contrast timing, patent. Small right transverse sinus. Review of the MIP images confirms the above findings IMPRESSION: 1. Findings concerning for acute or early subacute infarcts in the right occipital and parietal lobes, as described above. Mild edema without substantial mass effect. MRI could further characterize if clinically indicated. 2. No evidence of large vessel occlusion or proximal hemodynamically significant stenosis in the head or neck. 3. In the partially visualized lung apices there are patchy multifocal airspace opacities, which could represent edema or infection. Recommend dedicated chest imaging to further characterize. These results will be called to the ordering clinician or representative by the Radiologist Assistant, and communication documented in the PACS or [employer].  Electronically Signed   By: Feliberto Harts MD   On: 07/11/2020 19:05   MR BRAIN WO CONTRAST  Result Date: 07/11/2020 CLINICAL DATA:  Stroke follow-up.  Status post tPA EXAM: MRI HEAD WITHOUT CONTRAST TECHNIQUE: Multiplanar, multiecho pulse sequences of the brain and surrounding structures were obtained without intravenous contrast. COMPARISON:  None. FINDINGS: Brain: Multifocal acute ischemia in the right MCA territory, predominantly in the posterior parietal lobe. No contralateral ischemia. No acute or chronic hemorrhage. Normal white matter signal, parenchymal volume and CSF spaces. The midline structures are normal. Vascular: Major flow voids are preserved. Skull and upper cervical spine: Normal calvarium and skull base. Visualized upper cervical spine and soft tissues are normal. Sinuses/Orbits:Moderate sinus mucosal thickening diffusely. Normal orbits. IMPRESSION: Multifocal acute ischemia in the right MCA territory, predominantly in the posterior parietal lobe. No hemorrhage or mass effect. Electronically Signed   By: Deatra Robinson M.D.   On: 07/11/2020 23:02   DG Chest Port 1 View  Result Date: 07/12/2020 CLINICAL DATA:  COVID positive, shortness of breath EXAM: PORTABLE CHEST 1 VIEW COMPARISON:  2015 FINDINGS: Low lung volumes. No consolidation or edema. No pleural effusion or pneumothorax. Cardiomediastinal contours are likely within normal limits for technique. IMPRESSION: No acute process in the chest. Electronically Signed   By: Guadlupe Spanish M.D.   On: 07/12/2020 14:02   VAS [place] TRANSCRANIAL DOPPLER W BUBBLES  Result Date: 07/12/2020  Transcranial Doppler with Bubble Indications: Stroke. Performing Technologist: Jean Rosenthal RDMS  Examination Guidelines: A complete evaluation includes B-mode imaging, spectral Doppler, color Doppler, and power Doppler as needed of all accessible portions of each vessel. Bilateral testing is considered an integral part of a complete examination. Limited  examinations for reoccurring indications may be performed as noted.  Summary: No HITS at rest or during Valsalva. Negative transcranial Doppler Bubble study with no evidence of right to left intracardiac communication.  A vascular evaluation was performed. The left middle cerebral artery was studied. An IV was inserted into the patient's right forearm. Verbal informed consent was obtained.  *See table(s) above for TCD measurements and observations.  Diagnosing physician: Marvel Plan MD Electronically signed by Marvel Plan MD on 07/12/2020 at 8:20:20 PM.    Final    CT HEAD CODE STROKE WO CONTRAST  Result Date: 07/09/2020 CLINICAL DATA:  Code stroke.  Left-sided weakness EXAM: CT HEAD WITHOUT CONTRAST TECHNIQUE: Contiguous axial images were obtained from the base of the skull through the vertex without intravenous contrast. COMPARISON:  None. FINDINGS: Brain: There is no acute intracranial hemorrhage, mass effect, or edema. Gray-white differentiation is preserved. Ventricles and sulci are normal in size and configuration. No extra-axial collection. Vascular: No hyperdense vessel. Skull: Unremarkable. Sinuses/Orbits: Diffuse paranasal sinus mucosal thickening. Orbits are unremarkable. Other:  Mastoid air cells are clear. ASPECTS ([place] Stroke Program Early CT Score) - Ganglionic level infarction (caudate, lentiform nuclei, internal capsule, insula, M1-M3 cortex): 7 - Supraganglionic infarction (M4-M6 cortex): 3 Total score (0-10 with 10 being normal): 10 IMPRESSION: There is no acute intracranial hemorrhage or evidence of acute infarction. ASPECT score is 10. These results were called by telephone at the time of interpretation on 07/09/2020 at 7:50 pm to provider [facility] ZAMMIT , who verbally acknowledged these results. Electronically Signed   By: Guadlupe Spanish M.D.   On: 07/09/2020 19:52   VAS [place] LOWER EXTREMITY VENOUS (DVT)  Result Date: 07/12/2020  Lower Venous DVT Study Indications: Stroke, hormonal birth  control, Covid-19.  Comparison Study: No prior studies. Performing Technologist: Jean Rosenthal RDMS  Examination Guidelines: A complete evaluation includes B-mode imaging, spectral Doppler, color Doppler, and power Doppler as needed of all accessible portions of each vessel. Bilateral testing is considered an integral part of a complete examination. Limited examinations for reoccurring indications may be performed as noted. The reflux portion of the exam is performed with the patient in reverse Trendelenburg.  +---------+---------------+---------+-----------+----------+--------------+ RIGHT    CompressibilityPhasicitySpontaneityPropertiesThrombus Aging +---------+---------------+---------+-----------+----------+--------------+ CFV      Full           Yes      Yes                                 +---------+---------------+---------+-----------+----------+--------------+ SFJ      Full                                                        +---------+---------------+---------+-----------+----------+--------------+ FV Prox  Full                                                        +---------+---------------+---------+-----------+----------+--------------+ FV Mid   Full                                                        +---------+---------------+---------+-----------+----------+--------------+ FV DistalFull                                                        +---------+---------------+---------+-----------+----------+--------------+ PFV      Full                                                        +---------+---------------+---------+-----------+----------+--------------+ POP      Full           Yes      Yes                                 +---------+---------------+---------+-----------+----------+--------------+  PTV      Full                                                         +---------+---------------+---------+-----------+----------+--------------+ PERO     Full                                                        +---------+---------------+---------+-----------+----------+--------------+   +---------+---------------+---------+-----------+----------+--------------+ LEFT     CompressibilityPhasicitySpontaneityPropertiesThrombus Aging +---------+---------------+---------+-----------+----------+--------------+ CFV      Full           Yes      Yes                                 +---------+---------------+---------+-----------+----------+--------------+ SFJ      Full                                                        +---------+---------------+---------+-----------+----------+--------------+ FV Prox  Full                                                        +---------+---------------+---------+-----------+----------+--------------+ FV Mid   Full                                                        +---------+---------------+---------+-----------+----------+--------------+ FV DistalFull                                                        +---------+---------------+---------+-----------+----------+--------------+ PFV      Full                                                        +---------+---------------+---------+-----------+----------+--------------+ POP      Full           Yes      Yes                                 +---------+---------------+---------+-----------+----------+--------------+ PTV      Full                                                        +---------+---------------+---------+-----------+----------+--------------+  PERO     Full                                                        +---------+---------------+---------+-----------+----------+--------------+     Summary: RIGHT: - There is no evidence of deep vein thrombosis in the lower extremity.  - No cystic structure found in  the popliteal fossa.  LEFT: - There is no evidence of deep vein thrombosis in the lower extremity.  - No cystic structure found in the popliteal fossa.  *See table(s) above for measurements and observations.    Preliminary    ECHOCARDIOGRAM LIMITED  Result Date: 07/11/2020    ECHOCARDIOGRAM LIMITED REPORT   Patient Name:   April Carney Date of Exam: 07/11/2020 Medical Rec #:  [id number]         Height:       64.0 in Accession #:    [id number]        Weight:       265.4 lb Date of Birth:  05/31/1981        BSA:          2.206 m Patient Age:    39 years          BP:           133/66 mmHg Patient Gender: F                 HR:           91 bpm. Exam Location:  Inpatient Procedure: Limited Echo, Cardiac Doppler and Color Doppler Indications:    CVA  History:        Patient has no prior history of Echocardiogram examinations.                 Stroke; Risk Factors:Hypertension, Diabetes, Dyslipidemia and                 Obesity. COVID+.  Sonographer:    Lavenia Atlas Referring Phys: [id number] ERIC LINDZEN IMPRESSIONS  1. Left ventricular ejection fraction, by estimation, is 55 to 60%. The left ventricle has normal function. Left ventricular endocardial border not optimally defined to evaluate regional wall motion. Left ventricular diastolic parameters are consistent with Grade I diastolic dysfunction (impaired relaxation).  2. Right ventricular systolic function is normal. Tricuspid regurgitation signal is inadequate for assessing PA pressure.  3. The mitral valve is normal in structure. Trivial mitral valve regurgitation.  4. The aortic valve is normal in structure. FINDINGS  Left Ventricle: Left ventricular ejection fraction, by estimation, is 55 to 60%. The left ventricle has normal function. Left ventricular endocardial border not optimally defined to evaluate regional wall motion. Left ventricular diastolic parameters are consistent with Grade I diastolic dysfunction (impaired relaxation). Normal left  ventricular filling pressure. Right Ventricle: Right ventricular systolic function is normal. Tricuspid regurgitation signal is inadequate for assessing PA pressure. Left Atrium: Left atrial size was not well visualized. Right Atrium: Right atrial size was not well visualized. Pericardium: There is no evidence of pericardial effusion. Mitral Valve: The mitral valve is normal in structure. Trivial mitral valve regurgitation. Tricuspid Valve: The tricuspid valve is normal in structure. Aortic Valve: The aortic valve is normal in structure. Pulmonic Valve: The pulmonic valve was not well visualized. Aorta: Aortic root could not be assessed. IAS/Shunts: The interatrial septum  appears to be lipomatous. LEFT VENTRICLE PLAX 2D LVIDd:         5.10 cm Diastology LVIDs:         3.60 cm LV e' medial:    6.42 cm/s LV PW:         1.00 cm LV E/e' medial:  10.8 LV IVS:        1.00 cm LV e' lateral:   6.85 cm/s                        LV E/e' lateral: 10.1  RIGHT VENTRICLE RV S prime:     10.90 cm/s LEFT ATRIUM         Index LA diam:    4.00 cm 1.81 cm/m   AORTA Ao Root diam: 2.90 cm MITRAL VALVE MV Area (PHT): 8.62 cm MV Decel Time: 88 msec MV E velocity: 69.40 cm/s MV A velocity: 89.50 cm/s MV E/A ratio:  0.78 Armanda Magic MD Electronically signed by Armanda Magic MD Signature Date/Time: 07/11/2020/1:13:15 PM    Final

## 2020-07-13 NOTE — Progress Notes (Signed)
ANTICOAGULATION CONSULT NOTE - Follow Up Consult  Pharmacy Consult for Heparin Indication: pulmonary embolus / recent CVA / Covid  No Known Allergies  Patient Measurements: Height: 5\' 4"  (162.6 cm) Weight: 120.4 kg (265 lb 6.4 oz) IBW/kg (Calculated) : 54.7 Heparin Dosing Weight: 86 kg  Vital Signs: Temp: 98.2 F (36.8 C) (12/17 1144) Temp Source: Oral (12/17 1144) BP: 99/64 (12/17 1144) Pulse Rate: 79 (12/17 1144)  Labs: Recent Labs    07/11/20 0034 07/11/20 1358 07/12/20 0212 07/13/20 0409  HGB 6.6* 7.8* 8.0* 8.0*  HCT 22.5* 24.4* 26.1* 27.1*  PLT 562* 542* 516* 371  APTT 28  --   --   --   LABPROT 14.1  --   --   --   INR 1.1  --   --   --   CREATININE 0.75  --  0.84 0.71  CKTOTAL 36*  --   --   --     Estimated Creatinine Clearance: 123.1 mL/min (by C-G formula based on SCr of 0.71 mg/dL).  Assessment: 39 year old female with COVID, recent CVA, now with PE x 2.  To begin heparin for PE treatment, last dose of Lovenox last PM  Goal of Therapy:  Heparin level 0.3-0.7 units/ml Monitor platelets by anticoagulation protocol: Yes   Plan:  Small heparin bolus of 2000 units due to recent CVA Heparin drip at 1300 units / hr Heparin level in 6 hours Daily heparin level, CBC  Thank you 30, PharmD [phone]  07/13/2020,2:51 PM

## 2020-07-13 NOTE — Plan of Care (Signed)
Pt is alert and oriented. Pt is on RA. When the pt goes to the bathroom, her HR sustains in the 140-150's until she gets back in the bed.  Problem: Education: Goal: Knowledge of General Education information will improve Description: Including pain rating scale, medication(s)/side effects and non-pharmacologic comfort measures Outcome: Progressing   Problem: Health Behavior/Discharge Planning: Goal: Ability to manage health-related needs will improve Outcome: Progressing   Problem: Clinical Measurements: Goal: Ability to maintain clinical measurements within normal limits will improve Outcome: Progressing Goal: Will remain free from infection Outcome: Progressing Goal: Diagnostic test results will improve Outcome: Progressing Goal: Respiratory complications will improve Outcome: Progressing Goal: Cardiovascular complication will be avoided Outcome: Progressing   Problem: Activity: Goal: Risk for activity intolerance will decrease Outcome: Progressing   Problem: Nutrition: Goal: Adequate nutrition will be maintained Outcome: Progressing   Problem: Coping: Goal: Level of anxiety will decrease Outcome: Progressing   Problem: Elimination: Goal: Will not experience complications related to bowel motility Outcome: Progressing Goal: Will not experience complications related to urinary retention Outcome: Progressing   Problem: Pain Managment: Goal: General experience of comfort will improve Outcome: Progressing   Problem: Safety: Goal: Ability to remain free from injury will improve Outcome: Progressing   Problem: Skin Integrity: Goal: Risk for impaired skin integrity will decrease Outcome: Progressing   Problem: Education: Goal: Knowledge of secondary prevention will improve Outcome: Progressing Goal: Knowledge of patient specific risk factors addressed and post discharge goals established will improve Outcome: Progressing   Problem: Education: Goal: Ability to  describe self-care measures that may prevent or decrease complications (Diabetes Survival Skills Education) will improve Outcome: Progressing Goal: Individualized Educational Video(s) Outcome: Progressing   Problem: Coping: Goal: Ability to adjust to condition or change in health will improve Outcome: Progressing   Problem: Fluid Volume: Goal: Ability to maintain a balanced intake and output will improve Outcome: Progressing   Problem: Health Behavior/Discharge Planning: Goal: Ability to identify and utilize available resources and services will improve Outcome: Progressing Goal: Ability to manage health-related needs will improve Outcome: Progressing   Problem: Metabolic: Goal: Ability to maintain appropriate glucose levels will improve Outcome: Progressing   Problem: Nutritional: Goal: Maintenance of adequate nutrition will improve Outcome: Progressing Goal: Progress toward achieving an optimal weight will improve Outcome: Progressing   Problem: Skin Integrity: Goal: Risk for impaired skin integrity will decrease Outcome: Progressing   Problem: Tissue Perfusion: Goal: Adequacy of tissue perfusion will improve Outcome: Progressing

## 2020-07-13 NOTE — Progress Notes (Signed)
CTA chest positive for PE with radiographic evidence of right heart strain.  Clinically-patient not hypoxic-not hypotensive stable vital signs.  Although she has tachycardia with ambulation.  Recent echo on 12/15 without RV strain.  Lower extremity Dopplers were negative for DVT as well.  Discussed with Dr. Xu-neurology-stop aspirin-we will go and start IV heparin and watch closely.  If she were to develop hypoxemia or hypotension-we will then consult PCCM.  Given this new information-do not think she requires prednisone-we will go and stop prednisone as well.  [facility] hospitalist will resume primary attending role.

## 2020-07-13 NOTE — Progress Notes (Signed)
ANTICOAGULATION CONSULT NOTE - Follow Up Consult  Pharmacy Consult for Heparin Indication: pulmonary embolus / recent CVA / Covid  No Known Allergies  Patient Measurements: Height: 5\' 4"  (162.6 cm) Weight: 120.4 kg (265 lb 6.4 oz) IBW/kg (Calculated) : 54.7 Heparin Dosing Weight: 86 kg  Vital Signs: Temp: 98.6 F (37 C) (12/17 2317) Temp Source: Oral (12/17 2317) BP: 113/65 (12/17 2317) Pulse Rate: 65 (12/17 2317)  Labs: Recent Labs    07/11/20 0034 07/11/20 1358 07/12/20 0212 07/13/20 0409 07/13/20 2212  HGB 6.6* 7.8* 8.0* 8.0*  --   HCT 22.5* 24.4* 26.1* 27.1*  --   PLT 562* 542* 516* 371  --   APTT 28  --   --   --   --   LABPROT 14.1  --   --   --   --   INR 1.1  --   --   --   --   HEPARINUNFRC  --   --   --   --  <0.10*  CREATININE 0.75  --  0.84 0.71  --   CKTOTAL 36*  --   --   --   --     Estimated Creatinine Clearance: 123.1 mL/min (by C-G formula based on SCr of 0.71 mg/dL).  Assessment: 39 year old female with PE, recent CVA s/p tPA 12/13, for heparin  Goal of Therapy:  Heparin level 0.3-0.7 units/ml Monitor platelets by anticoagulation protocol: Yes   Plan:  Increase Heparin 1650 units/hr Follow-up am labs.   1/14, PharmD, BCPS  07/13/2020,11:56 PM

## 2020-07-13 NOTE — Progress Notes (Signed)
Pt has a long hx of menorrhagia. Dr. Jerral Ralph would like to give her a dose of feraheme for repletion.  Ulyses Southward, PharmD, BCIDP, AAHIVP, CPP Infectious Disease Pharmacist 07/13/2020 10:58 AM

## 2020-07-13 NOTE — Progress Notes (Signed)
I observed written COVID-19 Nasal RT-PCR test results from [facility] on 06/27/2020 were positive.

## 2020-07-13 NOTE — Progress Notes (Signed)
Inpatient Diabetes Program Recommendations  AACE/ADA: New Consensus Statement on Inpatient Glycemic Control (2015)  Target Ranges:  Prepandial:   less than 140 mg/dL      Peak postprandial:   less than 180 mg/dL (1-2 hours)      Critically ill patients:  140 - 180 mg/dL   Lab Results  Component Value Date   GLUCAP 298 (H) 07/13/2020   HGBA1C 8.6 (H) 07/11/2020    Review of Glycemic Control Results for April Carney, April Carney (MRN [id number]) as of 07/13/2020 13:28  Ref. Range 07/12/2020 17:54 07/12/2020 21:23 07/13/2020 06:19 07/13/2020 11:45  Glucose-Capillary Latest Ref Range: 70 - 99 mg/dL 381 (H) 840 (H) 375 (H) 298 (H)   Diabetes history: DM 2 Outpatient Diabetes medications:  Metformin 500 mg q AM and 1000 mg q PM, Januvia 50 mg daily Current orders for Inpatient glycemic control:  Novolog resistant tid with meals, Novolog 6 units tid with meals Inpatient Diabetes Program Recommendations:   Patient received steroids on 12/16 which is likely increasing blood sugars.   A1C indicates uncontrolled CBG's.  May need increases in PO medications for DM.   Thank s Beryl Meager, RN, BC-ADM Inpatient Diabetes Coordinator Pager [phone] (8a-5p)

## 2020-07-13 NOTE — TOC Initial Note (Signed)
Transition of Care [facility]) - Initial/Assessment Note    Patient Details  Name: April Carney MRN: [id number] Date of Birth: 08-03-1980  Transition of Care [facility]) CM/SW Contact:    Kermit Balo, RN Phone Number: 07/13/2020, 4:02 PM  Clinical Narrative:                 Pt lives with spouse and denies any issues with home medications or transportation. PCP: Dr Shellia Carwin TOC following for further d/c needs.   Expected Discharge Plan: Home/Self Care Barriers to Discharge: Continued Medical Work up   Patient Goals and CMS Choice        Expected Discharge Plan and Services Expected Discharge Plan: Home/Self Care   Discharge Planning Services: CM Consult                                          Prior Living Arrangements/Services   Lives with:: Spouse Patient language and need for interpreter reviewed:: Yes Do you feel safe going back to the place where you live?: Yes      Need for Family Participation in Patient Care: Yes (Comment) Care giver support system in place?: Yes (comment)   Criminal Activity/Legal Involvement Pertinent to Current Situation/Hospitalization: No - Comment as needed  Activities of Daily Living      Permission Sought/Granted                  Emotional Assessment Appearance:: Appears stated age Attitude/Demeanor/Rapport: Engaged Affect (typically observed): Accepting Orientation: : Oriented to Self,Oriented to Place,Oriented to  Time,Oriented to Situation   Psych Involvement: No (comment)  Admission diagnosis:  Stroke (cerebrum) (HCC) [I63.9] CVA (cerebral vascular accident) [facility]) [I63.9] Patient Active Problem List   Diagnosis Date Noted  . COVID-19   . Stroke (cerebrum) (HCC) 07/09/2020  . CVA (cerebral vascular accident) (HCC) 07/09/2020  . Nexplanon insertion 05/25/2020  . Anemia 08/22/2019  . Elevated liver function tests 06/02/2017  . Superficial fungus infection of skin 07/01/2016  . Essential hypertension  07/01/2016  . Diabetes mellitus (HCC) 12/12/2015  . Hyperlipidemia 12/12/2015  . Situational depression 12/11/2015  . Family h/o Biliary atresia 04/24/2015  . History of preterm delivery 04/24/2015  . History of cesarean section 04/24/2015  . Obesity 04/24/2015  . Hypothyroidism 04/24/2015   PCP:  Patient, No Pcp Per Pharmacy:   [facility] - [place], [place] - [address] [place] [address] Phone: [phone] Fax: [phone]     Social Determinants of Health (SDOH) Interventions    Readmission Risk Interventions No flowsheet data found.

## 2020-07-14 DIAGNOSIS — U071 COVID-19: Secondary | ICD-10-CM | POA: Diagnosis not present

## 2020-07-14 DIAGNOSIS — I63411 Cerebral infarction due to embolism of right middle cerebral artery: Secondary | ICD-10-CM | POA: Diagnosis not present

## 2020-07-14 LAB — BETA-2-GLYCOPROTEIN I ABS, IGG/M/A
Beta-2 Glyco I IgG: <9 GPI IgG units (ref 0–20)
Beta-2-Glycoprotein I IgA: <9 GPI IgA units (ref 0–25)
Beta-2-Glycoprotein I IgM: 9 GPI IgM units (ref 0–32)

## 2020-07-14 LAB — COMPREHENSIVE METABOLIC PANEL
ALT: 23 U/L (ref 0–44)
AST: 31 U/L (ref 15–41)
Albumin: 3 g/dL — ABNORMAL LOW (ref 3.5–5.0)
Alkaline Phosphatase: 47 U/L (ref 38–126)
Anion gap: 12 (ref 5–15)
BUN: 12 mg/dL (ref 6–20)
CO2: 21 mmol/L — ABNORMAL LOW (ref 22–32)
Calcium: 9.2 mg/dL (ref 8.9–10.3)
Chloride: 104 mmol/L (ref 98–111)
Creatinine, Ser: 0.68 mg/dL (ref 0.44–1.00)
GFR, Estimated: >60 mL/min (ref >60)
Glucose, Bld: 223 mg/dL — ABNORMAL HIGH (ref 70–99)
Potassium: 4.1 mmol/L (ref 3.5–5.1)
Sodium: 137 mmol/L (ref 135–145)
Total Bilirubin: 0.8 mg/dL (ref 0.3–1.2)
Total Protein: 6.2 g/dL — ABNORMAL LOW (ref 6.5–8.1)

## 2020-07-14 LAB — CBC WITH DIFFERENTIAL/PLATELET
Abs Immature Granulocytes: 0.19 10*3/uL — ABNORMAL HIGH (ref 0.00–0.07)
Basophils Absolute: 0 10*3/uL (ref 0.0–0.1)
Basophils Relative: 0 %
Eosinophils Absolute: 0 10*3/uL (ref 0.0–0.5)
Eosinophils Relative: 0 %
HCT: 23.7 % — ABNORMAL LOW (ref 36.0–46.0)
Hemoglobin: 7.3 g/dL — ABNORMAL LOW (ref 12.0–15.0)
Immature Granulocytes: 2 %
Lymphocytes Relative: 18 %
Lymphs Abs: 2.3 10*3/uL (ref 0.7–4.0)
MCH: 25 pg — ABNORMAL LOW (ref 26.0–34.0)
MCHC: 30.8 g/dL (ref 30.0–36.0)
MCV: 81.2 fL (ref 80.0–100.0)
Monocytes Absolute: 1.1 10*3/uL — ABNORMAL HIGH (ref 0.1–1.0)
Monocytes Relative: 9 %
Neutro Abs: 8.8 10*3/uL — ABNORMAL HIGH (ref 1.7–7.7)
Neutrophils Relative %: 71 %
Platelets: 495 10*3/uL — ABNORMAL HIGH (ref 150–400)
RBC: 2.92 MIL/uL — ABNORMAL LOW (ref 3.87–5.11)
RDW: 16.9 % — ABNORMAL HIGH (ref 11.5–15.5)
WBC: 12.5 10*3/uL — ABNORMAL HIGH (ref 4.0–10.5)
nRBC: 0.3 % — ABNORMAL HIGH (ref 0.0–0.2)

## 2020-07-14 LAB — GLUCOSE, CAPILLARY
Glucose-Capillary: 165 mg/dL — ABNORMAL HIGH (ref 70–99)
Glucose-Capillary: 179 mg/dL — ABNORMAL HIGH (ref 70–99)
Glucose-Capillary: 142 mg/dL — ABNORMAL HIGH (ref 70–99)
Glucose-Capillary: 224 mg/dL — ABNORMAL HIGH (ref 70–99)
Glucose-Capillary: 202 mg/dL — ABNORMAL HIGH (ref 70–99)
Glucose-Capillary: 195 mg/dL — ABNORMAL HIGH (ref 70–99)

## 2020-07-14 LAB — PHOSPHORUS: Phosphorus: 3.5 mg/dL (ref 2.5–4.6)

## 2020-07-14 LAB — HEPARIN LEVEL (UNFRACTIONATED)
Heparin Unfractionated: <0.1 IU/mL — ABNORMAL LOW (ref 0.30–0.70)
Heparin Unfractionated: 0.22 IU/mL — ABNORMAL LOW (ref 0.30–0.70)
Heparin Unfractionated: 0.13 IU/mL — ABNORMAL LOW (ref 0.30–0.70)

## 2020-07-14 LAB — ANTINUCLEAR ANTIBODIES, IFA: ANA Ab, IFA: NEGATIVE

## 2020-07-14 LAB — MAGNESIUM: Magnesium: 2 mg/dL (ref 1.7–2.4)

## 2020-07-14 LAB — C-REACTIVE PROTEIN: CRP: 0.5 mg/dL (ref <1.0)

## 2020-07-14 LAB — FERRITIN: Ferritin: 20 ng/mL (ref 11–307)

## 2020-07-14 LAB — D-DIMER, QUANTITATIVE: D-Dimer, Quant: 3.31 ug/mL-FEU — ABNORMAL HIGH (ref 0.00–0.50)

## 2020-07-14 NOTE — Progress Notes (Addendum)
ANTICOAGULATION CONSULT NOTE - Follow Up Consult  Pharmacy Consult for Heparin Indication: pulmonary embolus / recent CVA / Covid  No Known Allergies  Patient Measurements: Height: 5\' 4"  (162.6 cm) Weight: 120.4 kg (265 lb 6.4 oz) IBW/kg (Calculated) : 54.7 Heparin Dosing Weight: 86 kg  Vital Signs: Temp: 98.8 F (37.1 C) (12/18 0847) Temp Source: Oral (12/18 0847) BP: 117/79 (12/18 0847) Pulse Rate: 72 (12/18 0847)  Labs: Recent Labs    07/12/20 0212 07/13/20 0409 07/13/20 2212 07/14/20 0322 07/14/20 0756  HGB 8.0* 8.0*  --  7.3*  --   HCT 26.1* 27.1*  --  23.7*  --   PLT 516* 371  --  495*  --   HEPARINUNFRC  --   --  <0.10* <0.10* 0.22*  CREATININE 0.84 0.71  --  0.68  --     Estimated Creatinine Clearance: 123.1 mL/min (by C-G formula based on SCr of 0.68 mg/dL).  Assessment: 39 year old female with PE, recent CVA s/p tPA 12/13, for heparin  12/18 AM:  HL subtherapeutic at 0.22 on 1650 units/hr.  Per RN, no issues with line, no bleeding noted.  CBC stable. Will increase rate slightly by 2 units/kg/hr (based on protocol recommendation) to 1800 units/hr given new PE.  Goal of Therapy:  Heparin level 0.3-0.7 units/ml Monitor platelets by anticoagulation protocol: Yes   Plan:  Increase Heparin to 1800 units/hr Follow-up 6 hour HL  1/19, PharmD PGY-1 Acute Care Pharmacy Resident Office: [phone] 07/14/2020 9:02 AM

## 2020-07-14 NOTE — Progress Notes (Addendum)
ANTICOAGULATION CONSULT NOTE - Follow Up Consult  Pharmacy Consult for Heparin Indication: pulmonary embolus / recent CVA / Covid  No Known Allergies  Patient Measurements: Height: 5\' 4"  (162.6 cm) Weight: 120.4 kg (265 lb 6.4 oz) IBW/kg (Calculated) : 54.7 Heparin Dosing Weight: 86 kg  Vital Signs: Temp: 97.9 F (36.6 C) (12/18 1614) Temp Source: Oral (12/18 1614) BP: 109/74 (12/18 1614) Pulse Rate: 72 (12/18 1614)  Labs: Recent Labs    07/12/20 0212 07/13/20 0409 07/13/20 2212 07/14/20 0322 07/14/20 0756 07/14/20 1617  HGB 8.0* 8.0*  --  7.3*  --   --   HCT 26.1* 27.1*  --  23.7*  --   --   PLT 516* 371  --  495*  --   --   HEPARINUNFRC  --   --    < > <0.10* 0.22* 0.13*  CREATININE 0.84 0.71  --  0.68  --   --    < > = values in this interval not displayed.    Estimated Creatinine Clearance: 123.1 mL/min (by C-G formula based on SCr of 0.68 mg/dL).  Assessment: 39 year old female with PE, recent CVA s/p tPA 12/13, for heparin.  Repeat heparin level remains subtherapeutic at 0.13 despite rate increase earlier. Infusion line recently changed, previous line constantly beeping.  Goal of Therapy:  Heparin level 0.3-0.7 units/ml Monitor platelets by anticoagulation protocol: Yes   Plan:  Increase Heparin to 2050 units/h Recheck heparin level in 6h   1/14, PharmD, [place], [facility] Clinical Pharmacist [phone] Please check AMION for all [facility] Pharmacy numbers 07/14/2020

## 2020-07-14 NOTE — Progress Notes (Signed)
ME                                                                                                                                                                                                           Patient Demographics:    April Carney, is a 39 y.o. female, DOB - October 19, 1980, ZOX:[id number]  Outpatient Primary MD for the patient is Patient, No Pcp Per   Admit date - 07/09/2020   LOS - 5  Chief Complaint  Patient presents with  . Code Stroke       Brief Narrative: Patient is a 39 y.o. 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  Hospitalist service consulted on 12/16 for COVID-19 infection, sinus tachycardia.  Per patient-she and her son got diagnosed with COVID-19 infection on 12/1-she had significant cough and URI symptoms then.  Over the course of the next few weeks-her URI symptoms have essentially resolved-and her cough is now only minimal.    Subjective:   Patient in bed, appears comfortable, denies any headache, no fever, no chest pain or pressure, no shortness of breath , no abdominal pain. No focal weakness.    Assessment  & Plan :   COVID-19 viral pneumonia: Per patient report-her cough/URI symptoms have significantly improved compared to how she was on initial diagnosis on 12/1. On Steroid taper and improving.  Acute PE - Hep GTT >> Eliquis.  Sinus tachycardia: due to PE, TTE stable.  Asymptomatic bacteriuria: No UTI symptoms-has completed 3 days of Rocephin in case she had UTI.  Stop Rocephin-does not require any further antimicrobial therapy.  Iron deficiency anemia due to longstanding menorrhagia: Continue iron supplementation-we will discuss with pharmacy if patient can qualify for IV iron.  She needs outpatient follow-up with GYN-per patient-her menstrual.   Per patient her menstrual.  Can last for approximately 15 days at times.  Discussed with patient okay for blood transfusion if needed.  Acute CVA: DW - Dr. Roda Shutters on 07/14/2020 over the phone, continue anticoagulation per statin for secondary prevention and follow with neurology outpatient post discharge.  Rest of the issues  defer to primary team.  Diet :  Diet Order            Diet Heart Room service appropriate? Yes; Fluid consistency: Thin  Diet effective now                  Antimicorbials  :    Anti-infectives (From admission, onward)   Start     Dose/Rate Route Frequency Ordered Stop   07/13/20 1000  remdesivir 100 mg in sodium chloride 0.9 % 100 mL IVPB  Status:  Discontinued       "Followed by" Linked Group Details   100 mg 200 mL/hr over 30 Minutes Intravenous Daily 07/12/20 1303 07/13/20 0732   07/12/20 1400  remdesivir 200 mg in sodium chloride 0.9% 250 mL IVPB       "Followed by" Linked Group Details   200 mg 580 mL/hr over 30 Minutes Intravenous Once 07/12/20 1303 07/12/20 1604   07/11/20 0130  cefTRIAXone (ROCEPHIN) 1 g in sodium chloride 0.9 % 100 mL IVPB  Status:  Discontinued        1 g 200 mL/hr over 30 Minutes Intravenous Daily at bedtime 07/11/20 0040 07/13/20 0737      TTE - 1. Left ventricular ejection fraction, by estimation, is 55 to 60%. The left ventricle has normal function. Left ventricular endocardial border not optimally defined to evaluate regional wall motion. Left ventricular diastolic parameters are consistent with Grade I diastolic dysfunction (impaired relaxation).  2. Right ventricular systolic function is normal. Tricuspid regurgitation signal is inadequate for assessing PA pressure.  3. The mitral valve is normal in structure. Trivial mitral valve regurgitation.  4. The aortic valve is normal in structure.  CTA - Vascular: Acute bilateral pulmonary emboli extending from the bilateral upper and lower lobar to subsegmental branches. CT evidence of  significant pulmonary hypertension resulting in right heart strain (RV:LV = 1.1). Non-Vascular: Diffuse bilateral mid lung and peripheral predominant ground-glass and consolidative opacities most consistent with multifocal pneumonia associated with COVID-19.  MRI - Multifocal acute ischemia in the right MCA territory, predominantly in the posterior parietal lobe. No hemorrhage or mass effect.  Inpatient Medications  Scheduled Meds: . sodium chloride   Intravenous Once  . atorvastatin  20 mg Oral q1800  . Chlorhexidine Gluconate Cloth  6 each Topical Daily  . ferrous sulfate  325 mg Oral TID WC  . FLUoxetine  60 mg Oral Daily  . insulin aspart  0-20 Units Subcutaneous TID WC  . insulin aspart  6 Units Subcutaneous TID WC  . levothyroxine  150 mcg Oral Daily  . metoprolol tartrate  12.5 mg Oral BID  . pantoprazole  40 mg Oral Daily   Continuous Infusions: . heparin 1,800 Units/hr (07/14/20 0923)   PRN Meds:.acetaminophen **OR** [DISCONTINUED] acetaminophen (TYLENOL) oral liquid 160 mg/5 mL **OR** [DISCONTINUED] acetaminophen, guaiFENesin-dextromethorphan, metoprolol tartrate, senna-docusate   Time Spent in minutes  25  See all Orders from today for further details   Susa Raring M.D on 07/14/2020 at 10:33 AM  To page go to [email or web address] - use universal password  [facility] -  Office  [phone]    Objective:   Vitals:   07/13/20 2317 07/14/20 0324 07/14/20 0506 07/14/20 0847  BP: 113/65 121/65 (!) 110/58 117/79  Pulse: 65 (!) 55 (!) 49 72  Resp: 17 18 18 20   Temp: 98.6 F (37 C) 98.6 F (37 C) 98.1 F (36.7 C) 98.8 F (37.1 C)  TempSrc: Oral Oral Oral Oral  SpO2:  96% 98% 95% 97%  Weight:      Height:        Wt Readings from Last 3 Encounters:  07/09/20 120.4 kg  05/25/20 129.9 kg  05/14/20 129.7 kg     Intake/Output Summary (Last 24 hours) at 07/14/2020 1033 Last data filed at 07/13/2020 1846 Gross per 24 hour  Intake 265.14 ml  Output --   Net 265.14 ml     Physical Exam  Awake Alert, No new F.N deficits, Normal affect [place].AT,PERRAL Supple Neck,No JVD, No cervical lymphadenopathy appriciated.  Symmetrical Chest wall movement, Good air movement bilaterally, CTAB RRR,No Gallops, Rubs or new Murmurs, No Parasternal Heave +ve B.Sounds, Abd Soft, No tenderness, No organomegaly appriciated, No rebound - guarding or rigidity. No Cyanosis, Clubbing or edema, No new Rash or bruise    Data Review:    CBC Recent Labs  Lab 07/09/20 2006 07/11/20 0034 07/11/20 1358 07/12/20 0212 07/13/20 0409 07/14/20 0322  WBC 14.9* 9.4 10.0 8.9 7.8 12.5*  HGB 7.4* 6.6* 7.8* 8.0* 8.0* 7.3*  HCT 25.5* 22.5* 24.4* 26.1* 27.1* 23.7*  PLT 841* 562* 542* 516* 371 495*  MCV 79.7* 78.4* 78.2* 79.8* 84.4 81.2  MCH 23.1* 23.0* 25.0* 24.5* 24.9* 25.0*  MCHC 29.0* 29.3* 32.0 30.7 29.5* 30.8  RDW 15.4 15.4 15.6* 15.8* 16.7* 16.9*  LYMPHSABS 2.5  --   --   --  1.1 2.3  MONOABS 0.9  --   --   --  0.1 1.1*  EOSABS 0.3  --   --   --  0.0 0.0  BASOSABS 0.0  --   --   --  0.0 0.0    Chemistries  Recent Labs  Lab 07/09/20 2006 07/11/20 0034 07/12/20 0154 07/12/20 0212 07/13/20 0409 07/14/20 0322  NA 133* 137  --  139 136 137  K 3.2* 3.1*  --  3.7 4.5 4.1  CL 96* 98  --  102 104 104  CO2 22 24  --  25 21* 21*  GLUCOSE 151* 126*  --  122* 272* 223*  BUN 8 8  --  7 8 12   CREATININE 0.81 0.75  --  0.84 0.71 0.68  CALCIUM 9.3 9.3  --  9.2 9.4 9.2  MG  --   --  2.1  --  2.0 2.0  AST 38 19  --   --  15 31  ALT 42 25  --   --  22 23  ALKPHOS 65 57  --   --  53 47  BILITOT 1.2 1.2  --   --  0.7 0.8   ------------------------------------------------------------------------------------------------------------------ No results for input(s): CHOL, HDL, LDLCALC, TRIG, CHOLHDL, LDLDIRECT in the last 72 hours.  Lab Results  Component Value Date   HGBA1C 8.6 (H) 07/11/2020    ------------------------------------------------------------------------------------------------------------------ Recent Labs    07/12/20 0212  TSH 5.944*   ------------------------------------------------------------------------------------------------------------------ Recent Labs    07/12/20 0212 07/13/20 0409 07/14/20 0322  VITAMINB12 272  --   --   FERRITIN  --  14 20    Coagulation profile Recent Labs  Lab 07/09/20 2006 07/11/20 0034  INR 1.1 1.1    Recent Labs    07/13/20 0409 07/14/20 0322  DDIMER 6.10* 3.31*    Cardiac Enzymes No results for input(s): CKMB, TROPONINI, MYOGLOBIN in the last 168 hours.  Invalid input(s): CK ------------------------------------------------------------------------------------------------------------------ No results found for: BNP  Micro Results Recent Results (from the past 240 hour(s))  Resp Panel by RT-PCR (Flu A&B, Covid) Nasopharyngeal  Swab     Status: Abnormal   Collection Time: 07/09/20  8:25 PM   Specimen: Nasopharyngeal Swab; Nasopharyngeal(NP) swabs in vial transport medium  Result Value Ref Range Status   SARS Coronavirus 2 by RT PCR POSITIVE (A) NEGATIVE Final    Comment: CRITICAL RESULT CALLED TO, READ BACK BY AND VERIFIED WITH: T EASTER AT 2151 ON 07/09/2020 BY MOSLEY,J (NOTE) SARS-CoV-2 target nucleic acids are DETECTED.  The SARS-CoV-2 RNA is generally detectable in upper respiratory specimens during the acute phase of infection. Positive results are indicative of the presence of the identified virus, but do not rule out bacterial infection or co-infection with other pathogens not detected by the test. Clinical correlation with patient history and other diagnostic information is necessary to determine patient infection status. The expected result is Negative.  Fact Sheet for Patients: BloggerCourse.comhttps://www.fda.gov/media/152166/download  Fact Sheet for Healthcare  Providers: SeriousBroker.ithttps://www.fda.gov/media/152162/download  This test is not yet approved or cleared by the [place] FDA and  has been authorized for detection and/or diagnosis of SARS-CoV-2 by FDA under an Emergency Use Authorization (EUA).  This EUA will remain in effect (meanin g this test can be used) for the duration of  the COVID-19 declaration under Section 564(b)(1) of the Act, 21 U.S.C. section 360bbb-3(b)(1), unless the authorization is terminated or revoked sooner.     Influenza A by PCR NEGATIVE NEGATIVE Final   Influenza B by PCR NEGATIVE NEGATIVE Final    Comment: (NOTE) The Xpert Xpress SARS-CoV-2/FLU/RSV plus assay is intended as an aid in the diagnosis of influenza from Nasopharyngeal swab specimens and should not be used as a sole basis for treatment. Nasal washings and aspirates are unacceptable for Xpert Xpress SARS-CoV-2/FLU/RSV testing.  Fact Sheet for Patients: BloggerCourse.comhttps://www.fda.gov/media/152166/download  Fact Sheet for Healthcare Providers: SeriousBroker.ithttps://www.fda.gov/media/152162/download  This test is not yet approved or cleared by the [place] FDA and has been authorized for detection and/or diagnosis of SARS-CoV-2 by FDA under an Emergency Use Authorization (EUA). This EUA will remain in effect (meaning this test can be used) for the duration of the COVID-19 declaration under Section 564(b)(1) of the Act, 21 U.S.C. section 360bbb-3(b)(1), unless the authorization is terminated or revoked.  Performed at [facility], [address] [place], [place] [address]   MRSA PCR Screening     Status: None   Collection Time: 07/10/20 11:32 AM   Specimen: Nasopharyngeal  Result Value Ref Range Status   MRSA by PCR NEGATIVE NEGATIVE Final    Comment:        The GeneXpert MRSA Assay (FDA approved for NASAL specimens only), is one component of a comprehensive MRSA colonization surveillance program. It is not intended to diagnose MRSA infection nor to guide  or monitor treatment for MRSA infections. Performed at [facility], [address] [place], [place] [address]     Radiology Reports CT ANGIO HEAD W OR WO CONTRAST  Result Date: 07/11/2020 CLINICAL DATA:  Stroke/TIA.  Determine embolic source. EXAM: CT ANGIOGRAPHY HEAD AND NECK TECHNIQUE: Multidetector CT imaging of the head and neck was performed using the standard protocol during bolus administration of intravenous contrast. Multiplanar CT image reconstructions and MIPs were obtained to evaluate the vascular anatomy. Carotid stenosis measurements (when applicable) are obtained utilizing NASCET criteria, using the distal internal carotid diameter as the denominator. CONTRAST:  75mL OMNIPAQUE IOHEXOL 350 MG/ML SOLN COMPARISON:  CT head December 13, 21. FINDINGS: CT HEAD FINDINGS Brain: New cortical areas of hypodensity and loss of gray differentiation in the  right parietal and occipital lobes, concerning for acute or early subacute infarcts. Mild edema without substantial mass effect. No midline shift. No acute hemorrhage. No hydrocephalus. Skull: No acute fracture. Sinuses: Diffuse paranasal sinus mucosal thickening. Orbits: Unremarkable. Review of the MIP images confirms the above findings CTA NECK FINDINGS Aortic arch: Great vessel origins are patent. Right carotid system: No evidence of dissection, stenosis (50% or greater) or occlusion. Left carotid system: No evidence of dissection, stenosis (50% or greater) or occlusion. Vertebral arteries: Right dominant. No evidence of dissection, stenosis (50% or greater) or occlusion. Skeleton: No acute fracture. Other neck: No mass or suspicious adenopathy. Upper chest: In the partially visualized lung apices there are patchy multifocal airspace opacities, which could represent edema or infection. Review of the MIP images confirms the above findings CTA HEAD FINDINGS Anterior circulation: No significant stenosis, proximal occlusion, aneurysm, or  vascular malformation. Posterior circulation: No significant stenosis, proximal occlusion, aneurysm, or vascular malformation. Mild stenosis of the right P2 PCA. Venous sinuses: As permitted by contrast timing, patent. Small right transverse sinus. Review of the MIP images confirms the above findings IMPRESSION: 1. Findings concerning for acute or early subacute infarcts in the right occipital and parietal lobes, as described above. Mild edema without substantial mass effect. MRI could further characterize if clinically indicated. 2. No evidence of large vessel occlusion or proximal hemodynamically significant stenosis in the head or neck. 3. In the partially visualized lung apices there are patchy multifocal airspace opacities, which could represent edema or infection. Recommend dedicated chest imaging to further characterize. These results will be called to the ordering clinician or representative by the Radiologist Assistant, and communication documented in the PACS or [employer]. Electronically Signed   By: Feliberto Harts MD   On: 07/11/2020 19:05   CT ANGIO NECK W OR WO CONTRAST  Result Date: 07/11/2020 CLINICAL DATA:  Stroke/TIA.  Determine embolic source. EXAM: CT ANGIOGRAPHY HEAD AND NECK TECHNIQUE: Multidetector CT imaging of the head and neck was performed using the standard protocol during bolus administration of intravenous contrast. Multiplanar CT image reconstructions and MIPs were obtained to evaluate the vascular anatomy. Carotid stenosis measurements (when applicable) are obtained utilizing NASCET criteria, using the distal internal carotid diameter as the denominator. CONTRAST:  62mL OMNIPAQUE IOHEXOL 350 MG/ML SOLN COMPARISON:  CT head December 13, 21. FINDINGS: CT HEAD FINDINGS Brain: New cortical areas of hypodensity and loss of gray differentiation in the right parietal and occipital lobes, concerning for acute or early subacute infarcts. Mild edema without substantial mass  effect. No midline shift. No acute hemorrhage. No hydrocephalus. Skull: No acute fracture. Sinuses: Diffuse paranasal sinus mucosal thickening. Orbits: Unremarkable. Review of the MIP images confirms the above findings CTA NECK FINDINGS Aortic arch: Great vessel origins are patent. Right carotid system: No evidence of dissection, stenosis (50% or greater) or occlusion. Left carotid system: No evidence of dissection, stenosis (50% or greater) or occlusion. Vertebral arteries: Right dominant. No evidence of dissection, stenosis (50% or greater) or occlusion. Skeleton: No acute fracture. Other neck: No mass or suspicious adenopathy. Upper chest: In the partially visualized lung apices there are patchy multifocal airspace opacities, which could represent edema or infection. Review of the MIP images confirms the above findings CTA HEAD FINDINGS Anterior circulation: No significant stenosis, proximal occlusion, aneurysm, or vascular malformation. Posterior circulation: No significant stenosis, proximal occlusion, aneurysm, or vascular malformation. Mild stenosis of the right P2 PCA. Venous sinuses: As permitted by contrast timing, patent. Small right transverse sinus. Review  of the MIP images confirms the above findings IMPRESSION: 1. Findings concerning for acute or early subacute infarcts in the right occipital and parietal lobes, as described above. Mild edema without substantial mass effect. MRI could further characterize if clinically indicated. 2. No evidence of large vessel occlusion or proximal hemodynamically significant stenosis in the head or neck. 3. In the partially visualized lung apices there are patchy multifocal airspace opacities, which could represent edema or infection. Recommend dedicated chest imaging to further characterize. These results will be called to the ordering clinician or representative by the Radiologist Assistant, and communication documented in the PACS or [employer].  Electronically Signed   By: Feliberto Harts MD   On: 07/11/2020 19:05   CT ANGIO CHEST PE W OR WO CONTRAST  Result Date: 07/13/2020 CLINICAL DATA:  39 year old female with suspected pulmonary embolism. EXAM: CT ANGIOGRAPHY CHEST WITH CONTRAST TECHNIQUE: Multidetector CT imaging of the chest was performed using the standard protocol during bolus administration of intravenous contrast. Multiplanar CT image reconstructions and MIPs were obtained to evaluate the vascular anatomy. CONTRAST:  100 mL Omnipaque 350, intravenous COMPARISON:  None. FINDINGS: Cardiovascular: Satisfactory opacification of the pulmonary arteries to the segmental level. There are bilateral lobar nonocclusive acute pulmonary emboli extending into the upper and lower segmental and subsegmental branches bilaterally. The right to left ventricular ratio is 1.1. The main pulmonary artery measures up to 35 mm. No pericardial effusion. Mediastinum/Nodes: Multiple scattered mediastinal lymph nodes, none pathologic by size criteria. No axillary lymph nodes. Thyroid gland, trachea, and esophagus demonstrate no significant findings. Lungs/Pleura: Multifocal diffuse, mid lung and peripheral predominant ground-glass and consolidative opacities no pleural effusion or pneumothorax. Upper Abdomen: The visualized upper abdomen is within normal limits. Musculoskeletal: No chest wall abnormality. No acute or significant osseous findings. Review of the MIP images confirms the above findings. IMPRESSION: Vascular: Acute bilateral pulmonary emboli extending from the bilateral upper and lower lobar to subsegmental branches. CT evidence of significant pulmonary hypertension resulting in right heart strain (RV:LV = 1.1). Non-Vascular: Diffuse bilateral mid lung and peripheral predominant ground-glass and consolidative opacities most consistent with multifocal pneumonia associated with COVID-19. These results were called by telephone at the time of interpretation on  07/13/2020 at 2:42 pm to provider [facility] , who verbally acknowledged these results. Marliss Coots, MD [facility] Electronically Signed   By: Marliss Coots MD   On: 07/13/2020 14:45   MR BRAIN WO CONTRAST  Result Date: 07/11/2020 CLINICAL DATA:  Stroke follow-up.  Status post tPA EXAM: MRI HEAD WITHOUT CONTRAST TECHNIQUE: Multiplanar, multiecho pulse sequences of the brain and surrounding structures were obtained without intravenous contrast. COMPARISON:  None. FINDINGS: Brain: Multifocal acute ischemia in the right MCA territory, predominantly in the posterior parietal lobe. No contralateral ischemia. No acute or chronic hemorrhage. Normal white matter signal, parenchymal volume and CSF spaces. The midline structures are normal. Vascular: Major flow voids are preserved. Skull and upper cervical spine: Normal calvarium and skull base. Visualized upper cervical spine and soft tissues are normal. Sinuses/Orbits:Moderate sinus mucosal thickening diffusely. Normal orbits. IMPRESSION: Multifocal acute ischemia in the right MCA territory, predominantly in the posterior parietal lobe. No hemorrhage or mass effect. Electronically Signed   By: Deatra Robinson M.D.   On: 07/11/2020 23:02   DG Chest Port 1 View  Result Date: 07/12/2020 CLINICAL DATA:  COVID positive, shortness of breath EXAM: PORTABLE CHEST 1 VIEW COMPARISON:  2015 FINDINGS: Low lung volumes. No consolidation or edema.  No pleural effusion or pneumothorax. Cardiomediastinal contours are likely within normal limits for technique. IMPRESSION: No acute process in the chest. Electronically Signed   By: Guadlupe Spanish M.D.   On: 07/12/2020 14:02   VAS [place] TRANSCRANIAL DOPPLER W BUBBLES  Result Date: 07/12/2020  Transcranial Doppler with Bubble Indications: Stroke. Performing Technologist: Jean Rosenthal RDMS  Examination Guidelines: A complete evaluation includes B-mode imaging, spectral  Doppler, color Doppler, and power Doppler as needed of all accessible portions of each vessel. Bilateral testing is considered an integral part of a complete examination. Limited examinations for reoccurring indications may be performed as noted.  Summary: No HITS at rest or during Valsalva. Negative transcranial Doppler Bubble study with no evidence of right to left intracardiac communication.  A vascular evaluation was performed. The left middle cerebral artery was studied. An IV was inserted into the patient's right forearm. Verbal informed consent was obtained.  *See table(s) above for TCD measurements and observations.  Diagnosing physician: Marvel Plan MD Electronically signed by Marvel Plan MD on 07/12/2020 at 8:20:20 PM.    Final    CT HEAD CODE STROKE WO CONTRAST  Result Date: 07/09/2020 CLINICAL DATA:  Code stroke.  Left-sided weakness EXAM: CT HEAD WITHOUT CONTRAST TECHNIQUE: Contiguous axial images were obtained from the base of the skull through the vertex without intravenous contrast. COMPARISON:  None. FINDINGS: Brain: There is no acute intracranial hemorrhage, mass effect, or edema. Gray-white differentiation is preserved. Ventricles and sulci are normal in size and configuration. No extra-axial collection. Vascular: No hyperdense vessel. Skull: Unremarkable. Sinuses/Orbits: Diffuse paranasal sinus mucosal thickening. Orbits are unremarkable. Other: Mastoid air cells are clear. ASPECTS ([place]berta Stroke Program Early CT Score) - Ganglionic level infarction (caudate, lentiform nuclei, internal capsule, insula, M1-M3 cortex): 7 - Supraganglionic infarction (M4-M6 cortex): 3 Total score (0-10 with 10 being normal): 10 IMPRESSION: There is no acute intracranial hemorrhage or evidence of acute infarction. ASPECT score is 10. These results were called by telephone at the time of interpretation on 07/09/2020 at 7:50 pm to provider [facility] , who verbally acknowledged these results. Electronically  Signed   By: Guadlupe Spanish M.D.   On: 07/09/2020 19:52   VAS [place] LOWER EXTREMITY VENOUS (DVT)  Result Date: 07/13/2020  Lower Venous DVT Study Indications: Stroke, hormonal birth control, Covid-19.  Comparison Study: No prior studies. Performing Technologist: Jean Rosenthal RDMS  Examination Guidelines: A complete evaluation includes B-mode imaging, spectral Doppler, color Doppler, and power Doppler as needed of all accessible portions of each vessel. Bilateral testing is considered an integral part of a complete examination. Limited examinations for reoccurring indications may be performed as noted. The reflux portion of the exam is performed with the patient in reverse Trendelenburg.  +---------+---------------+---------+-----------+----------+--------------+ RIGHT    CompressibilityPhasicitySpontaneityPropertiesThrombus Aging +---------+---------------+---------+-----------+----------+--------------+ CFV      Full           Yes      Yes                                 +---------+---------------+---------+-----------+----------+--------------+ SFJ      Full                                                        +---------+---------------+---------+-----------+----------+--------------+ FV Prox  Full                                                        +---------+---------------+---------+-----------+----------+--------------+  FV Mid   Full                                                        +---------+---------------+---------+-----------+----------+--------------+ FV DistalFull                                                        +---------+---------------+---------+-----------+----------+--------------+ PFV      Full                                                        +---------+---------------+---------+-----------+----------+--------------+ POP      Full           Yes      Yes                                  +---------+---------------+---------+-----------+----------+--------------+ PTV      Full                                                        +---------+---------------+---------+-----------+----------+--------------+ PERO     Full                                                        +---------+---------------+---------+-----------+----------+--------------+   +---------+---------------+---------+-----------+----------+--------------+ LEFT     CompressibilityPhasicitySpontaneityPropertiesThrombus Aging +---------+---------------+---------+-----------+----------+--------------+ CFV      Full           Yes      Yes                                 +---------+---------------+---------+-----------+----------+--------------+ SFJ      Full                                                        +---------+---------------+---------+-----------+----------+--------------+ FV Prox  Full                                                        +---------+---------------+---------+-----------+----------+--------------+ FV Mid   Full                                                        +---------+---------------+---------+-----------+----------+--------------+  FV DistalFull                                                        +---------+---------------+---------+-----------+----------+--------------+ PFV      Full                                                        +---------+---------------+---------+-----------+----------+--------------+ POP      Full           Yes      Yes                                 +---------+---------------+---------+-----------+----------+--------------+ PTV      Full                                                        +---------+---------------+---------+-----------+----------+--------------+ PERO     Full                                                         +---------+---------------+---------+-----------+----------+--------------+     Summary: RIGHT: - There is no evidence of deep vein thrombosis in the lower extremity.  - No cystic structure found in the popliteal fossa.  LEFT: - There is no evidence of deep vein thrombosis in the lower extremity.  - No cystic structure found in the popliteal fossa.  *See table(s) above for measurements and observations. Electronically signed by Sherald Hess MD on 07/13/2020 at 4:53:52 PM.    Final    ECHOCARDIOGRAM LIMITED  Result Date: 07/11/2020    ECHOCARDIOGRAM LIMITED REPORT   Patient Name:   CLARRISA KAYLOR Date of Exam: 07/11/2020 Medical Rec #:  [id number]         Height:       64.0 in Accession #:    [id number]        Weight:       265.4 lb Date of Birth:  1981-06-13        BSA:          2.206 m Patient Age:    39 years          BP:           133/66 mmHg Patient Gender: F                 HR:           91 bpm. Exam Location:  Inpatient Procedure: Limited Echo, Cardiac Doppler and Color Doppler Indications:    CVA  History:        Patient has no prior history of Echocardiogram examinations.                 Stroke; Risk Factors:Hypertension, Diabetes, Dyslipidemia and  Obesity. COVID+.  Sonographer:    Lavenia Atlas Referring Phys: [phone] ERIC LINDZEN IMPRESSIONS  1. Left ventricular ejection fraction, by estimation, is 55 to 60%. The left ventricle has normal function. Left ventricular endocardial border not optimally defined to evaluate regional wall motion. Left ventricular diastolic parameters are consistent with Grade I diastolic dysfunction (impaired relaxation).  2. Right ventricular systolic function is normal. Tricuspid regurgitation signal is inadequate for assessing PA pressure.  3. The mitral valve is normal in structure. Trivial mitral valve regurgitation.  4. The aortic valve is normal in structure. FINDINGS  Left Ventricle: Left ventricular ejection fraction, by estimation, is 55 to  60%. The left ventricle has normal function. Left ventricular endocardial border not optimally defined to evaluate regional wall motion. Left ventricular diastolic parameters are consistent with Grade I diastolic dysfunction (impaired relaxation). Normal left ventricular filling pressure. Right Ventricle: Right ventricular systolic function is normal. Tricuspid regurgitation signal is inadequate for assessing PA pressure. Left Atrium: Left atrial size was not well visualized. Right Atrium: Right atrial size was not well visualized. Pericardium: There is no evidence of pericardial effusion. Mitral Valve: The mitral valve is normal in structure. Trivial mitral valve regurgitation. Tricuspid Valve: The tricuspid valve is normal in structure. Aortic Valve: The aortic valve is normal in structure. Pulmonic Valve: The pulmonic valve was not well visualized. Aorta: Aortic root could not be assessed. IAS/Shunts: The interatrial septum appears to be lipomatous. LEFT VENTRICLE PLAX 2D LVIDd:         5.10 cm Diastology LVIDs:         3.60 cm LV e' medial:    6.42 cm/s LV PW:         1.00 cm LV E/e' medial:  10.8 LV IVS:        1.00 cm LV e' lateral:   6.85 cm/s                        LV E/e' lateral: 10.1  RIGHT VENTRICLE RV S prime:     10.90 cm/s LEFT ATRIUM         Index LA diam:    4.00 cm 1.81 cm/m   AORTA Ao Root diam: 2.90 cm MITRAL VALVE MV Area (PHT): 8.62 cm MV Decel Time: 88 msec MV E velocity: 69.40 cm/s MV A velocity: 89.50 cm/s MV E/A ratio:  0.78 Armanda Magic MD Electronically signed by Armanda Magic MD Signature Date/Time: 07/11/2020/1:13:15 PM    Final

## 2020-07-15 DIAGNOSIS — I63411 Cerebral infarction due to embolism of right middle cerebral artery: Secondary | ICD-10-CM | POA: Diagnosis not present

## 2020-07-15 DIAGNOSIS — U071 COVID-19: Secondary | ICD-10-CM | POA: Diagnosis not present

## 2020-07-15 LAB — CBC WITH DIFFERENTIAL/PLATELET
Abs Immature Granulocytes: 0.27 10*3/uL — ABNORMAL HIGH (ref 0.00–0.07)
Basophils Absolute: 0 10*3/uL (ref 0.0–0.1)
Basophils Relative: 0 %
Eosinophils Absolute: 0.2 10*3/uL (ref 0.0–0.5)
Eosinophils Relative: 2 %
HCT: 28.8 % — ABNORMAL LOW (ref 36.0–46.0)
Hemoglobin: 8.2 g/dL — ABNORMAL LOW (ref 12.0–15.0)
Immature Granulocytes: 3 %
Lymphocytes Relative: 40 %
Lymphs Abs: 4 10*3/uL (ref 0.7–4.0)
MCH: 23.8 pg — ABNORMAL LOW (ref 26.0–34.0)
MCHC: 28.5 g/dL — ABNORMAL LOW (ref 30.0–36.0)
MCV: 83.5 fL (ref 80.0–100.0)
Monocytes Absolute: 0.9 10*3/uL (ref 0.1–1.0)
Monocytes Relative: 9 %
Neutro Abs: 4.6 10*3/uL (ref 1.7–7.7)
Neutrophils Relative %: 46 %
Platelets: 524 10*3/uL — ABNORMAL HIGH (ref 150–400)
RBC: 3.45 MIL/uL — ABNORMAL LOW (ref 3.87–5.11)
RDW: 18.6 % — ABNORMAL HIGH (ref 11.5–15.5)
WBC: 10 10*3/uL (ref 4.0–10.5)
nRBC: 0.6 % — ABNORMAL HIGH (ref 0.0–0.2)

## 2020-07-15 LAB — TYPE AND SCREEN
ABO/RH(D): A POS
Antibody Screen: NEGATIVE
Unit division: 0
Unit division: 0

## 2020-07-15 LAB — HEPARIN LEVEL (UNFRACTIONATED): Heparin Unfractionated: 0.14 IU/mL — ABNORMAL LOW (ref 0.30–0.70)

## 2020-07-15 LAB — COMPREHENSIVE METABOLIC PANEL
ALT: 50 U/L — ABNORMAL HIGH (ref 0–44)
AST: 58 U/L — ABNORMAL HIGH (ref 15–41)
Albumin: 3.3 g/dL — ABNORMAL LOW (ref 3.5–5.0)
Alkaline Phosphatase: 57 U/L (ref 38–126)
Anion gap: 12 (ref 5–15)
BUN: 11 mg/dL (ref 6–20)
CO2: 20 mmol/L — ABNORMAL LOW (ref 22–32)
Calcium: 9.1 mg/dL (ref 8.9–10.3)
Chloride: 107 mmol/L (ref 98–111)
Creatinine, Ser: 0.71 mg/dL (ref 0.44–1.00)
GFR, Estimated: >60 mL/min (ref >60)
Glucose, Bld: 146 mg/dL — ABNORMAL HIGH (ref 70–99)
Potassium: 4.1 mmol/L (ref 3.5–5.1)
Sodium: 139 mmol/L (ref 135–145)
Total Bilirubin: 0.7 mg/dL (ref 0.3–1.2)
Total Protein: 6.5 g/dL (ref 6.5–8.1)

## 2020-07-15 LAB — BPAM RBC
Blood Product Expiration Date: 202201102359
Blood Product Expiration Date: 202201102359
ISSUE DATE / TIME: 202112150952
Unit Type and Rh: 6200
Unit Type and Rh: 6200

## 2020-07-15 LAB — D-DIMER, QUANTITATIVE: D-Dimer, Quant: 2.96 ug/mL-FEU — ABNORMAL HIGH (ref 0.00–0.50)

## 2020-07-15 LAB — GLUCOSE, CAPILLARY
Glucose-Capillary: 135 mg/dL — ABNORMAL HIGH (ref 70–99)
Glucose-Capillary: 180 mg/dL — ABNORMAL HIGH (ref 70–99)
Glucose-Capillary: 94 mg/dL (ref 70–99)
Glucose-Capillary: 197 mg/dL — ABNORMAL HIGH (ref 70–99)

## 2020-07-15 LAB — MAGNESIUM: Magnesium: 2 mg/dL (ref 1.7–2.4)

## 2020-07-15 LAB — C-REACTIVE PROTEIN: CRP: 0.5 mg/dL (ref <1.0)

## 2020-07-15 MED ORDER — APIXABAN 5 MG PO TABS: 5.0000 mg | ORAL_TABLET | Freq: Two times a day (BID) | ORAL | Status: DC

## 2020-07-15 MED ORDER — APIXABAN 5 MG PO TABS: 10.0000 mg | ORAL_TABLET | Freq: Two times a day (BID) | ORAL | Status: DC

## 2020-07-15 MED ORDER — APIXABAN 5 MG PO TABS
10.0000 mg | ORAL_TABLET | Freq: Two times a day (BID) | ORAL | Status: DC
  Administered 2020-07-15 – 2020-07-16 (×3): 10 mg via ORAL
  Filled 2020-07-15 (×3): qty 2

## 2020-07-15 NOTE — Progress Notes (Signed)
ANTICOAGULATION CONSULT NOTE  Pharmacy Consult for Heparin --> apixaban Indication: pulmonary embolus / recent CVA   No Known Allergies  Patient Measurements: Height: 5\' 4"  (162.6 cm) Weight: 120.4 kg (265 lb 6.4 oz) IBW/kg (Calculated) : 54.7 Heparin Dosing Weight: 86 kg  Vital Signs: Temp: 98.1 F (36.7 C) (12/19 0532) Temp Source: Oral (12/19 0532) BP: 105/73 (12/19 0532) Pulse Rate: 69 (12/19 0532)  Labs: Recent Labs    07/13/20 0409 07/13/20 2212 07/14/20 0322 07/14/20 0756 07/14/20 1617 07/14/20 2331  HGB 8.0*  --  7.3*  --   --   --   HCT 27.1*  --  23.7*  --   --   --   PLT 371  --  495*  --   --   --   HEPARINUNFRC  --    < > <0.10* 0.22* 0.13* 0.14*  CREATININE 0.71  --  0.68  --   --   --    < > = values in this interval not displayed.    Estimated Creatinine Clearance: 123.1 mL/min (by C-G formula based on SCr of 0.68 mg/dL).  Assessment: 39 year old female with PE, recent CVA s/p tPA 12/13. Previously on heparin drip.  Pharmacy now consulted for transition to apixaban  Goal of Therapy:  Monitor platelets by anticoagulation protocol: Yes   Plan:  Discontinue heparin, apixaban to begin this AM Begin apixaban 10mg  BID x 7 days, followed by 5mg  BID thereafter Monitor CBC  1/14, PharmD PGY-1 Acute Care Pharmacy Resident Office: [phone] 07/15/2020 7:46 AM

## 2020-07-15 NOTE — Progress Notes (Signed)
ANTICOAGULATION CONSULT NOTE  Pharmacy Consult for Heparin Indication: pulmonary embolus / recent CVA   No Known Allergies  Patient Measurements: Height: 5\' 4"  (162.6 cm) Weight: 120.4 kg (265 lb 6.4 oz) IBW/kg (Calculated) : 54.7 Heparin Dosing Weight: 86 kg  Vital Signs: Temp: 98.6 F (37 C) (12/18 2356) Temp Source: Oral (12/18 2356) BP: 110/66 (12/18 2356) Pulse Rate: 75 (12/18 2356)  Labs: Recent Labs    07/12/20 0212 07/13/20 0409 07/13/20 2212 07/14/20 0322 07/14/20 0756 07/14/20 1617 07/14/20 2331  HGB 8.0* 8.0*  --  7.3*  --   --   --   HCT 26.1* 27.1*  --  23.7*  --   --   --   PLT 516* 371  --  495*  --   --   --   HEPARINUNFRC  --   --    < > <0.10* 0.22* 0.13* 0.14*  CREATININE 0.84 0.71  --  0.68  --   --   --    < > = values in this interval not displayed.    Estimated Creatinine Clearance: 123.1 mL/min (by C-G formula based on SCr of 0.68 mg/dL).  Assessment: 39 year old female with PE, recent CVA s/p tPA 12/13, for heparin  Goal of Therapy:  Heparin level 0.3-0.7 units/ml Monitor platelets by anticoagulation protocol: Yes   Plan:  Increase Heparin 2300 units/hr Follow-up am labs.   1/14, PharmD, BCPS  07/15/2020,12:31 AM

## 2020-07-15 NOTE — Progress Notes (Signed)
ME                                                                                                                                                                                                           Patient Demographics:    April Carney, is a 39 y.o. female, DOB - 10-14-1980, MLJ:[id number]  Outpatient Primary MD for the patient is Patient, No Pcp Per   Admit date - 07/09/2020   LOS - 6  Chief Complaint  Patient presents with  . Code Stroke       Brief Narrative: Patient is a 39 y.o. 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  Hospitalist service consulted on 12/16 for COVID-19 infection, sinus tachycardia.  Per patient-she and her son got diagnosed with COVID-19 infection on 12/1-she had significant cough and URI symptoms then.  Over the course of the next few weeks-her URI symptoms have essentially resolved-and her cough is now only minimal.    Subjective:   Patient in bed, appears comfortable, denies any headache, no fever, no chest pain or pressure, no shortness of breath , no abdominal pain. No focal weakness.   Assessment  & Plan :   COVID-19 viral pneumonia: Per patient report-her cough/URI symptoms have significantly improved compared to how she was on initial diagnosis on 12/1. On Steroid taper and improving.  Acute PE - Hep GTT >> Eliquis on 07/15/20.  Sinus tachycardia: due to PE, TTE stable.  Asymptomatic bacteriuria: No UTI symptoms-has completed 3 days of Rocephin in case she had UTI.  Stop Rocephin-does not require any further antimicrobial therapy.  Iron deficiency anemia due to longstanding menorrhagia: Continue iron supplementation-we will discuss with pharmacy if patient can qualify for IV iron.  She needs outpatient follow-up with GYN-per patient-her  menstrual.  Per patient her menstrual.  Can last for approximately 15 days at times.  Discussed with patient okay for blood transfusion if needed.  Acute CVA: DW - Dr. Roda Shutters on 07/14/2020 over the phone, continue anticoagulation per statin for secondary prevention and follow with neurology outpatient post discharge.    Diet :  Diet Order            Diet Heart Room service appropriate? Yes; Fluid consistency: Thin  Diet effective now                  Antimicorbials  :    Anti-infectives (From admission, onward)   Start     Dose/Rate Route Frequency Ordered Stop   07/13/20 1000  remdesivir 100 mg in sodium chloride 0.9 % 100 mL IVPB  Status:  Discontinued       "Followed by" Linked Group Details   100 mg 200 mL/hr over 30 Minutes Intravenous Daily 07/12/20 1303 07/13/20 0732   07/12/20 1400  remdesivir 200 mg in sodium chloride 0.9% 250 mL IVPB       "Followed by" Linked Group Details   200 mg 580 mL/hr over 30 Minutes Intravenous Once 07/12/20 1303 07/12/20 1604   07/11/20 0130  cefTRIAXone (ROCEPHIN) 1 g in sodium chloride 0.9 % 100 mL IVPB  Status:  Discontinued        1 g 200 mL/hr over 30 Minutes Intravenous Daily at bedtime 07/11/20 0040 07/13/20 0737      TTE - 1. Left ventricular ejection fraction, by estimation, is 55 to 60%. The left ventricle has normal function. Left ventricular endocardial border not optimally defined to evaluate regional wall motion. Left ventricular diastolic parameters are consistent with Grade I diastolic dysfunction (impaired relaxation).  2. Right ventricular systolic function is normal. Tricuspid regurgitation signal is inadequate for assessing PA pressure.  3. The mitral valve is normal in structure. Trivial mitral valve regurgitation.  4. The aortic valve is normal in structure.  CTA - Vascular: Acute bilateral pulmonary emboli extending from the bilateral upper and lower lobar to subsegmental branches. CT evidence of significant pulmonary  hypertension resulting in right heart strain (RV:LV = 1.1). Non-Vascular: Diffuse bilateral mid lung and peripheral predominant ground-glass and consolidative opacities most consistent with multifocal pneumonia associated with COVID-19.  MRI - Multifocal acute ischemia in the right MCA territory, predominantly in the posterior parietal lobe. No hemorrhage or mass effect.  Inpatient Medications  Scheduled Meds: . sodium chloride   Intravenous Once  . apixaban  10 mg Oral BID   Followed by  . [START ON 07/22/2020] apixaban  5 mg Oral BID  . atorvastatin  20 mg Oral q1800  . Chlorhexidine Gluconate Cloth  6 each Topical Daily  . ferrous sulfate  325 mg Oral TID WC  . FLUoxetine  60 mg Oral Daily  . insulin aspart  0-20 Units Subcutaneous TID WC  . insulin aspart  6 Units Subcutaneous TID WC  . levothyroxine  150 mcg Oral Daily  . metoprolol tartrate  12.5 mg Oral BID  . pantoprazole  40 mg Oral Daily   Continuous Infusions:  PRN Meds:.acetaminophen **OR** [DISCONTINUED] acetaminophen (TYLENOL) oral liquid 160 mg/5 mL **OR** [DISCONTINUED] acetaminophen, guaiFENesin-dextromethorphan, metoprolol tartrate, senna-docusate   Time Spent in minutes  25  See all Orders from today for further details   Susa Raring M.D on 07/15/2020 at 11:31 AM  To page go to [email or web address] - use universal password  [facility] -  Office  [phone]    Objective:   Vitals:   07/14/20 1935 07/14/20 2356 07/15/20 0532 07/15/20 0801  BP: (!) 91/49 110/66 105/73 (!) 104/59  Pulse: 76 75 69 67  Resp: 18 18 18 16   Temp: 97.6 F (36.4 C) 98.6 F (37 C) 98.1 F (36.7 C) 97.6 F (36.4 C)  TempSrc: Oral Oral Oral Oral  SpO2: 99% 96% 98% 99%  Weight:      Height:        Wt Readings from Last 3 Encounters:  07/09/20 120.4 kg  05/25/20 129.9 kg  05/14/20 129.7 kg    No intake or output data in the 24 hours ending 07/15/20 1131   Physical Exam  Awake Alert, No new F.N deficits,  Normal affect [place].AT,PERRAL Supple Neck,No JVD, No cervical lymphadenopathy appriciated.  Symmetrical Chest wall movement, Good air movement bilaterally, CTAB RRR,No Gallops, Rubs or new Murmurs, No Parasternal Heave +ve B.Sounds, Abd Soft, No tenderness, No organomegaly appriciated, No rebound - guarding or rigidity. No Cyanosis, Clubbing or edema, No new Rash or bruise     Data Review:    CBC Recent Labs  Lab 07/09/20 2006 07/11/20 0034 07/11/20 1358 07/12/20 0212 07/13/20 0409 07/14/20 0322 07/15/20 0758  WBC 14.9*   < > 10.0 8.9 7.8 12.5* 10.0  HGB 7.4*   < > 7.8* 8.0* 8.0* 7.3* 8.2*  HCT 25.5*   < > 24.4* 26.1* 27.1* 23.7* 28.8*  PLT 841*   < > 542* 516* 371 495* 524*  MCV 79.7*   < > 78.2* 79.8* 84.4 81.2 83.5  MCH 23.1*   < > 25.0* 24.5* 24.9* 25.0* 23.8*  MCHC 29.0*   < > 32.0 30.7 29.5* 30.8 28.5*  RDW 15.4   < > 15.6* 15.8* 16.7* 16.9* 18.6*  LYMPHSABS 2.5  --   --   --  1.1 2.3 4.0  MONOABS 0.9  --   --   --  0.1 1.1* 0.9  EOSABS 0.3  --   --   --  0.0 0.0 0.2  BASOSABS 0.0  --   --   --  0.0 0.0 0.0   < > = values in this interval not displayed.    Chemistries  Recent Labs  Lab 07/09/20 2006 07/11/20 0034 07/12/20 0154 07/12/20 0212 07/13/20 0409 07/14/20 0322 07/15/20 0758  NA 133* 137  --  139 136 137 139  K 3.2* 3.1*  --  3.7 4.5 4.1 4.1  CL 96* 98  --  102 104 104 107  CO2 22 24  --  25 21* 21* 20*  GLUCOSE 151* 126*  --  122* 272* 223* 146*  BUN 8 8  --  CREATININE 0.81 0.75  --  0.84 0.71 0.68 0.71  CALCIUM 9.3 9.3  --  9.2 9.4 9.2 9.1  MG  --   --  2.1  --  2.0 2.0 2.0  AST 38 19  --   --  15 31 58*  ALT 42 25  --   --  22 23 50*  ALKPHOS 65 57  --   --  53 47 57  BILITOT 1.2 1.2  --   --  0.7 0.8 0.7   ------------------------------------------------------------------------------------------------------------------ No results for input(s): CHOL, HDL, LDLCALC, TRIG, CHOLHDL, LDLDIRECT in the last 72 hours.  Lab Results   Component Value Date   HGBA1C 8.6 (H) 07/11/2020   ------------------------------------------------------------------------------------------------------------------ No results for input(s): TSH, T4TOTAL, T3FREE, THYROIDAB in the last 72 hours.  Invalid input(s): FREET3 ------------------------------------------------------------------------------------------------------------------ Recent Labs    07/13/20 0409 07/14/20 0322  FERRITIN 14 20    Coagulation profile Recent Labs  Lab 07/09/20 2006 07/11/20 0034  INR 1.1 1.1    Recent Labs    07/14/20 0322 07/15/20 0758  DDIMER 3.31* 2.96*  Cardiac Enzymes No results for input(s): CKMB, TROPONINI, MYOGLOBIN in the last 168 hours.  Invalid input(s): CK ------------------------------------------------------------------------------------------------------------------ No results found for: BNP  Micro Results Recent Results (from the past 240 hour(s))  Resp Panel by RT-PCR (Flu A&B, Covid) Nasopharyngeal Swab     Status: Abnormal   Collection Time: 07/09/20  8:25 PM   Specimen: Nasopharyngeal Swab; Nasopharyngeal(NP) swabs in vial transport medium  Result Value Ref Range Status   SARS Coronavirus 2 by RT PCR POSITIVE (A) NEGATIVE Final    Comment: CRITICAL RESULT CALLED TO, READ BACK BY AND VERIFIED WITH: T EASTER AT 2151 ON 07/09/2020 BY MOSLEY,J (NOTE) SARS-CoV-2 target nucleic acids are DETECTED.  The SARS-CoV-2 RNA is generally detectable in upper respiratory specimens during the acute phase of infection. Positive results are indicative of the presence of the identified virus, but do not rule out bacterial infection or co-infection with other pathogens not detected by the test. Clinical correlation with patient history and other diagnostic information is necessary to determine patient infection status. The expected result is Negative.  Fact Sheet for Patients: BloggerCourse.com  Fact  Sheet for Healthcare Providers: SeriousBroker.it  This test is not yet approved or cleared by the [place] FDA and  has been authorized for detection and/or diagnosis of SARS-CoV-2 by FDA under an Emergency Use Authorization (EUA).  This EUA will remain in effect (meanin g this test can be used) for the duration of  the COVID-19 declaration under Section 564(b)(1) of the Act, 21 U.S.C. section 360bbb-3(b)(1), unless the authorization is terminated or revoked sooner.     Influenza A by PCR NEGATIVE NEGATIVE Final   Influenza B by PCR NEGATIVE NEGATIVE Final    Comment: (NOTE) The Xpert Xpress SARS-CoV-2/FLU/RSV plus assay is intended as an aid in the diagnosis of influenza from Nasopharyngeal swab specimens and should not be used as a sole basis for treatment. Nasal washings and aspirates are unacceptable for Xpert Xpress SARS-CoV-2/FLU/RSV testing.  Fact Sheet for Patients: BloggerCourse.com  Fact Sheet for Healthcare Providers: SeriousBroker.it  This test is not yet approved or cleared by the [place] FDA and has been authorized for detection and/or diagnosis of SARS-CoV-2 by FDA under an Emergency Use Authorization (EUA). This EUA will remain in effect (meaning this test can be used) for the duration of the COVID-19 declaration under Section 564(b)(1) of the Act, 21 U.S.C. section 360bbb-3(b)(1), unless the authorization is terminated or revoked.  Performed at [facility], [address], [place], [place] [address]   MRSA PCR Screening     Status: None   Collection Time: 07/10/20 11:32 AM   Specimen: Nasopharyngeal  Result Value Ref Range Status   MRSA by PCR NEGATIVE NEGATIVE Final    Comment:        The GeneXpert MRSA Assay (FDA approved for NASAL specimens only), is one component of a comprehensive MRSA colonization surveillance program. It is not intended to diagnose  MRSA infection nor to guide or monitor treatment for MRSA infections. Performed at [facility], [address] [place], [place] [address]     Radiology Reports CT ANGIO HEAD W OR WO CONTRAST  Result Date: 07/11/2020 CLINICAL DATA:  Stroke/TIA.  Determine embolic source. EXAM: CT ANGIOGRAPHY HEAD AND NECK TECHNIQUE: Multidetector CT imaging of the head and neck was performed using the standard protocol during bolus administration of intravenous contrast. Multiplanar CT image reconstructions and MIPs were obtained to evaluate the vascular anatomy. Carotid stenosis measurements (when applicable) are obtained utilizing  NASCET criteria, using the distal internal carotid diameter as the denominator. CONTRAST:  69mL OMNIPAQUE IOHEXOL 350 MG/ML SOLN COMPARISON:  CT head December 13, 21. FINDINGS: CT HEAD FINDINGS Brain: New cortical areas of hypodensity and loss of gray differentiation in the right parietal and occipital lobes, concerning for acute or early subacute infarcts. Mild edema without substantial mass effect. No midline shift. No acute hemorrhage. No hydrocephalus. Skull: No acute fracture. Sinuses: Diffuse paranasal sinus mucosal thickening. Orbits: Unremarkable. Review of the MIP images confirms the above findings CTA NECK FINDINGS Aortic arch: Great vessel origins are patent. Right carotid system: No evidence of dissection, stenosis (50% or greater) or occlusion. Left carotid system: No evidence of dissection, stenosis (50% or greater) or occlusion. Vertebral arteries: Right dominant. No evidence of dissection, stenosis (50% or greater) or occlusion. Skeleton: No acute fracture. Other neck: No mass or suspicious adenopathy. Upper chest: In the partially visualized lung apices there are patchy multifocal airspace opacities, which could represent edema or infection. Review of the MIP images confirms the above findings CTA HEAD FINDINGS Anterior circulation: No significant stenosis, proximal  occlusion, aneurysm, or vascular malformation. Posterior circulation: No significant stenosis, proximal occlusion, aneurysm, or vascular malformation. Mild stenosis of the right P2 PCA. Venous sinuses: As permitted by contrast timing, patent. Small right transverse sinus. Review of the MIP images confirms the above findings IMPRESSION: 1. Findings concerning for acute or early subacute infarcts in the right occipital and parietal lobes, as described above. Mild edema without substantial mass effect. MRI could further characterize if clinically indicated. 2. No evidence of large vessel occlusion or proximal hemodynamically significant stenosis in the head or neck. 3. In the partially visualized lung apices there are patchy multifocal airspace opacities, which could represent edema or infection. Recommend dedicated chest imaging to further characterize. These results will be called to the ordering clinician or representative by the Radiologist Assistant, and communication documented in the PACS or [employer]. Electronically Signed   By: Feliberto Harts MD   On: 07/11/2020 19:05   CT ANGIO NECK W OR WO CONTRAST  Result Date: 07/11/2020 CLINICAL DATA:  Stroke/TIA.  Determine embolic source. EXAM: CT ANGIOGRAPHY HEAD AND NECK TECHNIQUE: Multidetector CT imaging of the head and neck was performed using the standard protocol during bolus administration of intravenous contrast. Multiplanar CT image reconstructions and MIPs were obtained to evaluate the vascular anatomy. Carotid stenosis measurements (when applicable) are obtained utilizing NASCET criteria, using the distal internal carotid diameter as the denominator. CONTRAST:  27mL OMNIPAQUE IOHEXOL 350 MG/ML SOLN COMPARISON:  CT head December 13, 21. FINDINGS: CT HEAD FINDINGS Brain: New cortical areas of hypodensity and loss of gray differentiation in the right parietal and occipital lobes, concerning for acute or early subacute infarcts. Mild edema without  substantial mass effect. No midline shift. No acute hemorrhage. No hydrocephalus. Skull: No acute fracture. Sinuses: Diffuse paranasal sinus mucosal thickening. Orbits: Unremarkable. Review of the MIP images confirms the above findings CTA NECK FINDINGS Aortic arch: Great vessel origins are patent. Right carotid system: No evidence of dissection, stenosis (50% or greater) or occlusion. Left carotid system: No evidence of dissection, stenosis (50% or greater) or occlusion. Vertebral arteries: Right dominant. No evidence of dissection, stenosis (50% or greater) or occlusion. Skeleton: No acute fracture. Other neck: No mass or suspicious adenopathy. Upper chest: In the partially visualized lung apices there are patchy multifocal airspace opacities, which could represent edema or infection. Review of the MIP images confirms the above findings CTA HEAD  FINDINGS Anterior circulation: No significant stenosis, proximal occlusion, aneurysm, or vascular malformation. Posterior circulation: No significant stenosis, proximal occlusion, aneurysm, or vascular malformation. Mild stenosis of the right P2 PCA. Venous sinuses: As permitted by contrast timing, patent. Small right transverse sinus. Review of the MIP images confirms the above findings IMPRESSION: 1. Findings concerning for acute or early subacute infarcts in the right occipital and parietal lobes, as described above. Mild edema without substantial mass effect. MRI could further characterize if clinically indicated. 2. No evidence of large vessel occlusion or proximal hemodynamically significant stenosis in the head or neck. 3. In the partially visualized lung apices there are patchy multifocal airspace opacities, which could represent edema or infection. Recommend dedicated chest imaging to further characterize. These results will be called to the ordering clinician or representative by the Radiologist Assistant, and communication documented in the PACS or [employer]. Electronically Signed   By: Feliberto Harts MD   On: 07/11/2020 19:05   CT ANGIO CHEST PE W OR WO CONTRAST  Result Date: 07/13/2020 CLINICAL DATA:  39 year old female with suspected pulmonary embolism. EXAM: CT ANGIOGRAPHY CHEST WITH CONTRAST TECHNIQUE: Multidetector CT imaging of the chest was performed using the standard protocol during bolus administration of intravenous contrast. Multiplanar CT image reconstructions and MIPs were obtained to evaluate the vascular anatomy. CONTRAST:  100 mL Omnipaque 350, intravenous COMPARISON:  None. FINDINGS: Cardiovascular: Satisfactory opacification of the pulmonary arteries to the segmental level. There are bilateral lobar nonocclusive acute pulmonary emboli extending into the upper and lower segmental and subsegmental branches bilaterally. The right to left ventricular ratio is 1.1. The main pulmonary artery measures up to 35 mm. No pericardial effusion. Mediastinum/Nodes: Multiple scattered mediastinal lymph nodes, none pathologic by size criteria. No axillary lymph nodes. Thyroid gland, trachea, and esophagus demonstrate no significant findings. Lungs/Pleura: Multifocal diffuse, mid lung and peripheral predominant ground-glass and consolidative opacities no pleural effusion or pneumothorax. Upper Abdomen: The visualized upper abdomen is within normal limits. Musculoskeletal: No chest wall abnormality. No acute or significant osseous findings. Review of the MIP images confirms the above findings. IMPRESSION: Vascular: Acute bilateral pulmonary emboli extending from the bilateral upper and lower lobar to subsegmental branches. CT evidence of significant pulmonary hypertension resulting in right heart strain (RV:LV = 1.1). Non-Vascular: Diffuse bilateral mid lung and peripheral predominant ground-glass and consolidative opacities most consistent with multifocal pneumonia associated with COVID-19. These results were called by telephone at the time of  interpretation on 07/13/2020 at 2:42 pm to provider [facility] , who verbally acknowledged these results. Marliss Coots, MD [facility] Electronically Signed   By: Marliss Coots MD   On: 07/13/2020 14:45   MR BRAIN WO CONTRAST  Result Date: 07/11/2020 CLINICAL DATA:  Stroke follow-up.  Status post tPA EXAM: MRI HEAD WITHOUT CONTRAST TECHNIQUE: Multiplanar, multiecho pulse sequences of the brain and surrounding structures were obtained without intravenous contrast. COMPARISON:  None. FINDINGS: Brain: Multifocal acute ischemia in the right MCA territory, predominantly in the posterior parietal lobe. No contralateral ischemia. No acute or chronic hemorrhage. Normal white matter signal, parenchymal volume and CSF spaces. The midline structures are normal. Vascular: Major flow voids are preserved. Skull and upper cervical spine: Normal calvarium and skull base. Visualized upper cervical spine and soft tissues are normal. Sinuses/Orbits:Moderate sinus mucosal thickening diffusely. Normal orbits. IMPRESSION: Multifocal acute ischemia in the right MCA territory, predominantly in the posterior parietal lobe. No hemorrhage or mass effect. Electronically Signed   By: Caryn Bee  Chase Picket M.D.   On: 07/11/2020 23:02   DG Chest Port 1 View  Result Date: 07/12/2020 CLINICAL DATA:  COVID positive, shortness of breath EXAM: PORTABLE CHEST 1 VIEW COMPARISON:  2015 FINDINGS: Low lung volumes. No consolidation or edema. No pleural effusion or pneumothorax. Cardiomediastinal contours are likely within normal limits for technique. IMPRESSION: No acute process in the chest. Electronically Signed   By: Guadlupe Spanish M.D.   On: 07/12/2020 14:02   VAS [place] TRANSCRANIAL DOPPLER W BUBBLES  Result Date: 07/12/2020  Transcranial Doppler with Bubble Indications: Stroke. Performing Technologist: Jean Rosenthal RDMS  Examination Guidelines: A complete evaluation includes B-mode  imaging, spectral Doppler, color Doppler, and power Doppler as needed of all accessible portions of each vessel. Bilateral testing is considered an integral part of a complete examination. Limited examinations for reoccurring indications may be performed as noted.  Summary: No HITS at rest or during Valsalva. Negative transcranial Doppler Bubble study with no evidence of right to left intracardiac communication.  A vascular evaluation was performed. The left middle cerebral artery was studied. An IV was inserted into the patient's right forearm. Verbal informed consent was obtained.  *See table(s) above for TCD measurements and observations.  Diagnosing physician: Marvel Plan MD Electronically signed by Marvel Plan MD on 07/12/2020 at 8:20:20 PM.    Final    CT HEAD CODE STROKE WO CONTRAST  Result Date: 07/09/2020 CLINICAL DATA:  Code stroke.  Left-sided weakness EXAM: CT HEAD WITHOUT CONTRAST TECHNIQUE: Contiguous axial images were obtained from the base of the skull through the vertex without intravenous contrast. COMPARISON:  None. FINDINGS: Brain: There is no acute intracranial hemorrhage, mass effect, or edema. Gray-white differentiation is preserved. Ventricles and sulci are normal in size and configuration. No extra-axial collection. Vascular: No hyperdense vessel. Skull: Unremarkable. Sinuses/Orbits: Diffuse paranasal sinus mucosal thickening. Orbits are unremarkable. Other: Mastoid air cells are clear. ASPECTS ([place] Stroke Program Early CT Score) - Ganglionic level infarction (caudate, lentiform nuclei, internal capsule, insula, M1-M3 cortex): 7 - Supraganglionic infarction (M4-M6 cortex): 3 Total score (0-10 with 10 being normal): 10 IMPRESSION: There is no acute intracranial hemorrhage or evidence of acute infarction. ASPECT score is 10. These results were called by telephone at the time of interpretation on 07/09/2020 at 7:50 pm to provider [facility] , who verbally acknowledged these results.  Electronically Signed   By: Guadlupe Spanish M.D.   On: 07/09/2020 19:52   VAS [place] LOWER EXTREMITY VENOUS (DVT)  Result Date: 07/13/2020  Lower Venous DVT Study Indications: Stroke, hormonal birth control, Covid-19.  Comparison Study: No prior studies. Performing Technologist: Jean Rosenthal RDMS  Examination Guidelines: A complete evaluation includes B-mode imaging, spectral Doppler, color Doppler, and power Doppler as needed of all accessible portions of each vessel. Bilateral testing is considered an integral part of a complete examination. Limited examinations for reoccurring indications may be performed as noted. The reflux portion of the exam is performed with the patient in reverse Trendelenburg.  +---------+---------------+---------+-----------+----------+--------------+ RIGHT    CompressibilityPhasicitySpontaneityPropertiesThrombus Aging +---------+---------------+---------+-----------+----------+--------------+ CFV      Full           Yes      Yes                                 +---------+---------------+---------+-----------+----------+--------------+ SFJ      Full                                                        +---------+---------------+---------+-----------+----------+--------------+  FV Prox  Full                                                        +---------+---------------+---------+-----------+----------+--------------+ FV Mid   Full                                                        +---------+---------------+---------+-----------+----------+--------------+ FV DistalFull                                                        +---------+---------------+---------+-----------+----------+--------------+ PFV      Full                                                        +---------+---------------+---------+-----------+----------+--------------+ POP      Full           Yes      Yes                                  +---------+---------------+---------+-----------+----------+--------------+ PTV      Full                                                        +---------+---------------+---------+-----------+----------+--------------+ PERO     Full                                                        +---------+---------------+---------+-----------+----------+--------------+   +---------+---------------+---------+-----------+----------+--------------+ LEFT     CompressibilityPhasicitySpontaneityPropertiesThrombus Aging +---------+---------------+---------+-----------+----------+--------------+ CFV      Full           Yes      Yes                                 +---------+---------------+---------+-----------+----------+--------------+ SFJ      Full                                                        +---------+---------------+---------+-----------+----------+--------------+ FV Prox  Full                                                        +---------+---------------+---------+-----------+----------+--------------+  FV Mid   Full                                                        +---------+---------------+---------+-----------+----------+--------------+ FV DistalFull                                                        +---------+---------------+---------+-----------+----------+--------------+ PFV      Full                                                        +---------+---------------+---------+-----------+----------+--------------+ POP      Full           Yes      Yes                                 +---------+---------------+---------+-----------+----------+--------------+ PTV      Full                                                        +---------+---------------+---------+-----------+----------+--------------+ PERO     Full                                                         +---------+---------------+---------+-----------+----------+--------------+     Summary: RIGHT: - There is no evidence of deep vein thrombosis in the lower extremity.  - No cystic structure found in the popliteal fossa.  LEFT: - There is no evidence of deep vein thrombosis in the lower extremity.  - No cystic structure found in the popliteal fossa.  *See table(s) above for measurements and observations. Electronically signed by Sherald Hesshristopher Clark MD on 07/13/2020 at 4:53:52 PM.    Final    ECHOCARDIOGRAM LIMITED  Result Date: 07/11/2020    ECHOCARDIOGRAM LIMITED REPORT   Patient Name:   Dorothyann GibbsGINGER L Havlik Date of Exam: 07/11/2020 Medical Rec #:  [id number]         Height:       64.0 in Accession #:    [id number]        Weight:       265.4 lb Date of Birth:  April 27, 1981        BSA:          2.206 m Patient Age:    39 years          BP:           133/66 mmHg Patient Gender: F                 HR:           91 bpm. Exam  Location:  Inpatient Procedure: Limited Echo, Cardiac Doppler and Color Doppler Indications:    CVA  History:        Patient has no prior history of Echocardiogram examinations.                 Stroke; Risk Factors:Hypertension, Diabetes, Dyslipidemia and                 Obesity. COVID+.  Sonographer:    Lavenia Atlas Referring Phys: [phone] ERIC LINDZEN IMPRESSIONS  1. Left ventricular ejection fraction, by estimation, is 55 to 60%. The left ventricle has normal function. Left ventricular endocardial border not optimally defined to evaluate regional wall motion. Left ventricular diastolic parameters are consistent with Grade I diastolic dysfunction (impaired relaxation).  2. Right ventricular systolic function is normal. Tricuspid regurgitation signal is inadequate for assessing PA pressure.  3. The mitral valve is normal in structure. Trivial mitral valve regurgitation.  4. The aortic valve is normal in structure. FINDINGS  Left Ventricle: Left ventricular ejection fraction, by estimation, is 55 to  60%. The left ventricle has normal function. Left ventricular endocardial border not optimally defined to evaluate regional wall motion. Left ventricular diastolic parameters are consistent with Grade I diastolic dysfunction (impaired relaxation). Normal left ventricular filling pressure. Right Ventricle: Right ventricular systolic function is normal. Tricuspid regurgitation signal is inadequate for assessing PA pressure. Left Atrium: Left atrial size was not well visualized. Right Atrium: Right atrial size was not well visualized. Pericardium: There is no evidence of pericardial effusion. Mitral Valve: The mitral valve is normal in structure. Trivial mitral valve regurgitation. Tricuspid Valve: The tricuspid valve is normal in structure. Aortic Valve: The aortic valve is normal in structure. Pulmonic Valve: The pulmonic valve was not well visualized. Aorta: Aortic root could not be assessed. IAS/Shunts: The interatrial septum appears to be lipomatous. LEFT VENTRICLE PLAX 2D LVIDd:         5.10 cm Diastology LVIDs:         3.60 cm LV e' medial:    6.42 cm/s LV PW:         1.00 cm LV E/e' medial:  10.8 LV IVS:        1.00 cm LV e' lateral:   6.85 cm/s                        LV E/e' lateral: 10.1  RIGHT VENTRICLE RV S prime:     10.90 cm/s LEFT ATRIUM         Index LA diam:    4.00 cm 1.81 cm/m   AORTA Ao Root diam: 2.90 cm MITRAL VALVE MV Area (PHT): 8.62 cm MV Decel Time: 88 msec MV E velocity: 69.40 cm/s MV A velocity: 89.50 cm/s MV E/A ratio:  0.78 Armanda Magic MD Electronically signed by Armanda Magic MD Signature Date/Time: 07/11/2020/1:13:15 PM    Final

## 2020-07-16 ENCOUNTER — Other Ambulatory Visit (HOSPITAL_COMMUNITY): Payer: Self-pay | Admitting: Internal Medicine

## 2020-07-16 DIAGNOSIS — U071 COVID-19: Secondary | ICD-10-CM | POA: Diagnosis not present

## 2020-07-16 DIAGNOSIS — I63411 Cerebral infarction due to embolism of right middle cerebral artery: Secondary | ICD-10-CM | POA: Diagnosis not present

## 2020-07-16 LAB — COMPREHENSIVE METABOLIC PANEL
ALT: 49 U/L — ABNORMAL HIGH (ref 0–44)
AST: 43 U/L — ABNORMAL HIGH (ref 15–41)
Albumin: 3.3 g/dL — ABNORMAL LOW (ref 3.5–5.0)
Alkaline Phosphatase: 52 U/L (ref 38–126)
Anion gap: 12 (ref 5–15)
BUN: 10 mg/dL (ref 6–20)
CO2: 21 mmol/L — ABNORMAL LOW (ref 22–32)
Calcium: 9 mg/dL (ref 8.9–10.3)
Chloride: 102 mmol/L (ref 98–111)
Creatinine, Ser: 0.65 mg/dL (ref 0.44–1.00)
GFR, Estimated: >60 mL/min (ref >60)
Glucose, Bld: 161 mg/dL — ABNORMAL HIGH (ref 70–99)
Potassium: 4.6 mmol/L (ref 3.5–5.1)
Sodium: 135 mmol/L (ref 135–145)
Total Bilirubin: 0.9 mg/dL (ref 0.3–1.2)
Total Protein: 6.3 g/dL — ABNORMAL LOW (ref 6.5–8.1)

## 2020-07-16 LAB — CBC WITH DIFFERENTIAL/PLATELET
Abs Immature Granulocytes: 0.34 10*3/uL — ABNORMAL HIGH (ref 0.00–0.07)
Basophils Absolute: 0 10*3/uL (ref 0.0–0.1)
Basophils Relative: 0 %
Eosinophils Absolute: 0.3 10*3/uL (ref 0.0–0.5)
Eosinophils Relative: 3 %
HCT: 28.7 % — ABNORMAL LOW (ref 36.0–46.0)
Hemoglobin: 8.3 g/dL — ABNORMAL LOW (ref 12.0–15.0)
Immature Granulocytes: 4 %
Lymphocytes Relative: 38 %
Lymphs Abs: 3.5 10*3/uL (ref 0.7–4.0)
MCH: 24.4 pg — ABNORMAL LOW (ref 26.0–34.0)
MCHC: 28.9 g/dL — ABNORMAL LOW (ref 30.0–36.0)
MCV: 84.4 fL (ref 80.0–100.0)
Monocytes Absolute: 0.8 10*3/uL (ref 0.1–1.0)
Monocytes Relative: 9 %
Neutro Abs: 4.3 10*3/uL (ref 1.7–7.7)
Neutrophils Relative %: 46 %
Platelets: 447 10*3/uL — ABNORMAL HIGH (ref 150–400)
RBC: 3.4 MIL/uL — ABNORMAL LOW (ref 3.87–5.11)
RDW: 19.1 % — ABNORMAL HIGH (ref 11.5–15.5)
WBC: 9.4 10*3/uL (ref 4.0–10.5)
nRBC: 0.4 % — ABNORMAL HIGH (ref 0.0–0.2)

## 2020-07-16 LAB — GLUCOSE, CAPILLARY: Glucose-Capillary: 123 mg/dL — ABNORMAL HIGH (ref 70–99)

## 2020-07-16 LAB — C-REACTIVE PROTEIN: CRP: <0.5 mg/dL (ref <1.0)

## 2020-07-16 LAB — MAGNESIUM: Magnesium: 1.9 mg/dL (ref 1.7–2.4)

## 2020-07-16 LAB — D-DIMER, QUANTITATIVE: D-Dimer, Quant: 5.9 ug/mL-FEU — ABNORMAL HIGH (ref 0.00–0.50)

## 2020-07-16 MED ORDER — APIXABAN 5 MG PO TABS: 10.0000 mg | ORAL_TABLET | Freq: Two times a day (BID) | ORAL | 0 refills | Status: DC

## 2020-07-16 MED ORDER — APIXABAN 5 MG PO TABS: 5.0000 mg | ORAL_TABLET | Freq: Two times a day (BID) | ORAL | 0 refills | Status: DC

## 2020-07-16 MED ORDER — FERROUS SULFATE 325 (65 FE) MG PO TABS: 325.0000 mg | ORAL_TABLET | Freq: Two times a day (BID) | ORAL | 0 refills | Status: DC

## 2020-07-16 MED FILL — FERROUS SULFATE 325 MG TAB: 325 (65 FE) | 30 days supply | Qty: 60 | Fill #0

## 2020-07-16 MED FILL — ELIQUIS 5 MG TABLET: 5 | 30 days supply | Qty: 74 | Fill #0

## 2020-07-16 NOTE — Progress Notes (Signed)
SATURATION QUALIFICATIONS: (This note is used to comply with regulatory documentation for home oxygen)  Patient Saturations on Room Air at Rest = 100%  Patient Saturations on Room Air while Ambulating = 99%  

## 2020-07-16 NOTE — Discharge Summary (Signed)
April Carney PTW:[id number] DOB: April 29, 1981 DOA: 07/09/2020  PCP: Patient, No Pcp Per  Admit date: 07/09/2020  Discharge date: 07/16/2020  Admitted From: Home  Disposition:  Home   Recommendations for Outpatient Follow-up:   Follow up with PCP in 1-2 weeks  PCP Please obtain BMP/CBC, 2 view CXR in 1week,  (see Discharge instructions)   PCP Please follow up on the following pending results: Monitor H&H, outpatient OB follow-up for menorrhagia   Home Health: None   Equipment/Devices: o2 if qualifies  Consultations: Neuro Discharge Condition: Stable    CODE STATUS: Full    Diet Recommendation: Heart Healthy Low Carb   Chief Complaint  Patient presents with   Code Stroke     Brief history of present illness from the day of admission and additional interim summary    Patient is a 39 y.o. 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  Hospitalist service consulted on 12/16 for COVID-19 infection, sinus tachycardia.  Per patient-she and her son got diagnosed with COVID-19 infection on she was also diagnosed with large PE.  Eventually transferred to hospitalist service from neurology service.                                                                 Hospital Course    COVID-19 viral pneumonia: Per patient report-her cough/URI symptoms have significantly improved compared to how she was on initial diagnosis on 12/1.  Patient was steroid taper, this problem has resolved she is stable on room air at rest.  Will do ambulatory pulse ox if she qualifies will discharge home on oxygen upon ambulation.  Acute PE - Hep GTT >> Eliquis on 07/15/20.  Sinus tachycardia: due to  PE, TTE stable.  Asymptomatic bacteriuria: ? UTI, has completed 3 days of Rocephin.  Iron deficiency anemia due to longstanding menorrhagia: Continue iron supplementation-we will discuss with pharmacy if patient can qualify for IV iron.  She needs outpatient follow-up with GYN-per patient-her menstrual.  Per patient her menstrual.  Can last for approximately 15 days at times.  Discussed with patient okay for blood transfusion if needed.  Acute CVA: DW - Dr. Erlinda Hong on 07/14/2020 over the phone, continue anticoagulation & statin for secondary prevention and follow with neurology outpatient post discharge.   Recent Labs  Lab 07/09/20 2006 07/09/20 2025 07/11/20 0034 07/11/20 1358 07/12/20 0212 07/13/20 0409 07/14/20 0322 07/15/20 0758 07/16/20 0214  WBC 14.9*  --  9.4   < > 8.9 7.8 12.5* 10.0 9.4  CRP  --   --   --   --  3.3* 3.0* 0.5 0.5 <0.5  DDIMER  --   --   --   --  4.12* 6.10* 3.31*  2.96* 5.90*  PROCALCITON  --   --   --   --   --  <0.10  --   --   --   AST 38  --  19  --   --  15 31 58* 43*  ALT 42  --  25  --   --  22 23 50* 49*  ALKPHOS 65  --  57  --   --  53 47 57 52  BILITOT 1.2  --  1.2  --   --  0.7 0.8 0.7 0.9  ALBUMIN 3.9  --  3.3*  --   --  3.2* 3.0* 3.3* 3.3*  INR 1.1  --  1.1  --   --   --   --   --   --   SARSCOV2NAA  --  POSITIVE*  --   --   --   --   --   --   --    < > = values in this interval not displayed.       TTE - 1. Left ventricular ejection fraction, by estimation, is 55 to 60%. The left ventricle has normal function. Left ventricular endocardial border not optimally defined to evaluate regional wall motion. Left ventricular diastolic parameters are consistent with Grade I diastolic dysfunction (impaired relaxation).  2. Right ventricular systolic function is normal. Tricuspid regurgitation signal is inadequate for assessing PA pressure.  3. The mitral valve is normal in structure. Trivial mitral valve regurgitation.  4. The aortic valve is normal in  structure.  CTA - Vascular: Acute bilateral pulmonary emboli extending from the bilateral upper and lower lobar to subsegmental branches. CT evidence of significant pulmonary hypertension resulting in right heart strain (RV:LV = 1.1). Non-Vascular: Diffuse bilateral mid lung and peripheral predominant ground-glass and consolidative opacities most consistent with multifocal pneumonia associated with COVID-19.  MRI - Multifocal acute ischemia in the right MCA territory, predominantly in the posterior parietal lobe. No hemorrhage or mass effect.     Discharge diagnosis     Active Problems:   Stroke (cerebrum) [facility])   CVA (cerebral vascular accident) [facility])   COVID-19    Discharge instructions    Discharge Instructions    Ambulatory referral to Neurology   Complete by: As directed    Follow up with [facility] Leonie [facility] in 4 weeks. Too complicated for NP to follow. Thanks.   Discharge instructions   Complete by: As directed    Follow with Primary MD in 7 days   Get CBC, CMP, 2 view Chest X ray -  checked next visit within 1 week by Primary MD    Activity: As tolerated with Full fall precautions use walker/cane & assistance as needed  Disposition Home   Diet: Heart Healthy  Low Carb  Special Instructions: If you have smoked or chewed Tobacco  in the last 2 yrs please stop smoking, stop any regular Alcohol  and or any Recreational drug use.  On your next visit with your primary care physician please Get Medicines reviewed and adjusted.  Please request your Prim.MD to go over all Hospital Tests and Procedure/Radiological results at the follow up, please get all Hospital records sent to your Prim MD by signing hospital release before you go home.  If you experience worsening of your admission symptoms, develop shortness of breath, life threatening emergency, suicidal or homicidal thoughts you must seek medical attention immediately by calling 911 or calling your MD immediately  if  symptoms  less severe.  You Must read complete instructions/literature along with all the possible adverse reactions/side effects for all the Medicines you take and that have been prescribed to you. Take any new Medicines after you have completely understood and accpet all the possible adverse reactions/side effects. *   Increase activity slowly   Complete by: As directed    MyChart COVID-19 home monitoring program   Complete by: Jul 16, 2020    Is the patient willing to use the [place] for home monitoring?: Yes   Temperature monitoring   Complete by: Jul 16, 2020    After how many days would you like to receive a notification of this patient's flowsheet entries?: 1      Discharge Medications   Allergies as of 07/16/2020   No Known Allergies     Medication List    TAKE these medications   Accu-Chek FastClix Lancets Misc Use to check blood glucose twice a day   Accu-Chek Guide test strip Generic drug: glucose blood Use to check glucose twice a day   acetaminophen 325 MG tablet Commonly known as: TYLENOL Take 650 mg by mouth every 6 (six) hours as needed.   apixaban 5 MG Tabs tablet Commonly known as: ELIQUIS Take 2 tablets (10 mg total) by mouth 2 (two) times daily for 5 days.   apixaban 5 MG Tabs tablet Commonly known as: ELIQUIS Take 1 tablet (5 mg total) by mouth 2 (two) times daily. Start taking on: July 22, 2020   atorvastatin 20 MG tablet Commonly known as: LIPITOR Take 1 tablet (20 mg total) by mouth daily.   blood glucose meter kit and supplies Kit Dispense based on patient and insurance preference. Use up to four times daily as directed. (FOR ICD-9 250.00, 250.01).   ferrous sulfate 325 (65 FE) MG tablet Take 1 tablet (325 mg total) by mouth 2 (two) times daily with a meal.   FLUoxetine 20 MG capsule Commonly known as: PROzac Take three pills each morning as directed. What changed:   how much to take  how to take this  when to take  this   hydrochlorothiazide 12.5 MG capsule Commonly known as: MICROZIDE Take 1 capsule (12.5 mg total) by mouth daily.   levothyroxine 150 MCG tablet Commonly known as: SYNTHROID Take 1 tablet (150 mcg total) by mouth daily.   metFORMIN 500 MG tablet Commonly known as: GLUCOPHAGE TAKE 1 TABLET BY MOUTH WITH BREAKFAST AND 2 TABLETS WITH EVENING MEAL   sitaGLIPtin 50 MG tablet Commonly known as: JANUVIA Take 50 mg by mouth daily.        Follow-up Information    Garvin Fila, MD. Schedule an appointment as soon as possible for a visit in 4 week(s).   Specialties: Neurology, Radiology Contact information: [address] [place] [address] [place]. Schedule an appointment as soon as possible for a visit in 1 week(s).   Contact information: [place] [id number] [phone]              Major procedures and Radiology Reports - PLEASE review detailed and final reports thoroughly  -       CT ANGIO HEAD W OR WO CONTRAST  Result Date: 07/11/2020 CLINICAL DATA:  Stroke/TIA.  Determine embolic source. EXAM: CT ANGIOGRAPHY HEAD AND NECK TECHNIQUE: Multidetector CT imaging of the head and neck was performed using the standard protocol  during bolus administration of intravenous contrast. Multiplanar CT image reconstructions and MIPs were obtained to evaluate the vascular anatomy. Carotid stenosis measurements (when applicable) are obtained utilizing NASCET criteria, using the distal internal carotid diameter as the denominator. CONTRAST:  24m OMNIPAQUE IOHEXOL 350 MG/ML SOLN COMPARISON:  CT head December 13, 21. FINDINGS: CT HEAD FINDINGS Brain: New cortical areas of hypodensity and loss of gray differentiation in the right parietal and occipital lobes, concerning for acute or early subacute infarcts. Mild edema without substantial mass effect. No midline shift. No acute  hemorrhage. No hydrocephalus. Skull: No acute fracture. Sinuses: Diffuse paranasal sinus mucosal thickening. Orbits: Unremarkable. Review of the MIP images confirms the above findings CTA NECK FINDINGS Aortic arch: Great vessel origins are patent. Right carotid system: No evidence of dissection, stenosis (50% or greater) or occlusion. Left carotid system: No evidence of dissection, stenosis (50% or greater) or occlusion. Vertebral arteries: Right dominant. No evidence of dissection, stenosis (50% or greater) or occlusion. Skeleton: No acute fracture. Other neck: No mass or suspicious adenopathy. Upper chest: In the partially visualized lung apices there are patchy multifocal airspace opacities, which could represent edema or infection. Review of the MIP images confirms the above findings CTA HEAD FINDINGS Anterior circulation: No significant stenosis, proximal occlusion, aneurysm, or vascular malformation. Posterior circulation: No significant stenosis, proximal occlusion, aneurysm, or vascular malformation. Mild stenosis of the right P2 PCA. Venous sinuses: As permitted by contrast timing, patent. Small right transverse sinus. Review of the MIP images confirms the above findings IMPRESSION: 1. Findings concerning for acute or early subacute infarcts in the right occipital and parietal lobes, as described above. Mild edema without substantial mass effect. MRI could further characterize if clinically indicated. 2. No evidence of large vessel occlusion or proximal hemodynamically significant stenosis in the head or neck. 3. In the partially visualized lung apices there are patchy multifocal airspace opacities, which could represent edema or infection. Recommend dedicated chest imaging to further characterize. These results will be called to the ordering clinician or representative by the Radiologist Assistant, and communication documented in the PACS or [employer] Electronically Signed   By: FMargaretha Sheffield MD   On: 07/11/2020 19:05   CT ANGIO NECK W OR WO CONTRAST  Result Date: 07/11/2020 CLINICAL DATA:  Stroke/TIA.  Determine embolic source. EXAM: CT ANGIOGRAPHY HEAD AND NECK TECHNIQUE: Multidetector CT imaging of the head and neck was performed using the standard protocol during bolus administration of intravenous contrast. Multiplanar CT image reconstructions and MIPs were obtained to evaluate the vascular anatomy. Carotid stenosis measurements (when applicable) are obtained utilizing NASCET criteria, using the distal internal carotid diameter as the denominator. CONTRAST:  762mOMNIPAQUE IOHEXOL 350 MG/ML SOLN COMPARISON:  CT head December 13, 21. FINDINGS: CT HEAD FINDINGS Brain: New cortical areas of hypodensity and loss of gray differentiation in the right parietal and occipital lobes, concerning for acute or early subacute infarcts. Mild edema without substantial mass effect. No midline shift. No acute hemorrhage. No hydrocephalus. Skull: No acute fracture. Sinuses: Diffuse paranasal sinus mucosal thickening. Orbits: Unremarkable. Review of the MIP images confirms the above findings CTA NECK FINDINGS Aortic arch: Great vessel origins are patent. Right carotid system: No evidence of dissection, stenosis (50% or greater) or occlusion. Left carotid system: No evidence of dissection, stenosis (50% or greater) or occlusion. Vertebral arteries: Right dominant. No evidence of dissection, stenosis (50% or greater) or occlusion. Skeleton: No acute fracture. Other neck: No mass or suspicious adenopathy. Upper chest: In the  partially visualized lung apices there are patchy multifocal airspace opacities, which could represent edema or infection. Review of the MIP images confirms the above findings CTA HEAD FINDINGS Anterior circulation: No significant stenosis, proximal occlusion, aneurysm, or vascular malformation. Posterior circulation: No significant stenosis, proximal occlusion, aneurysm, or vascular  malformation. Mild stenosis of the right P2 PCA. Venous sinuses: As permitted by contrast timing, patent. Small right transverse sinus. Review of the MIP images confirms the above findings IMPRESSION: 1. Findings concerning for acute or early subacute infarcts in the right occipital and parietal lobes, as described above. Mild edema without substantial mass effect. MRI could further characterize if clinically indicated. 2. No evidence of large vessel occlusion or proximal hemodynamically significant stenosis in the head or neck. 3. In the partially visualized lung apices there are patchy multifocal airspace opacities, which could represent edema or infection. Recommend dedicated chest imaging to further characterize. These results will be called to the ordering clinician or representative by the Radiologist Assistant, and communication documented in the PACS or [employer]. Electronically Signed   By: Margaretha Sheffield MD   On: 07/11/2020 19:05   CT ANGIO CHEST PE W OR WO CONTRAST  Result Date: 07/13/2020 CLINICAL DATA:  39 year old female with suspected pulmonary embolism. EXAM: CT ANGIOGRAPHY CHEST WITH CONTRAST TECHNIQUE: Multidetector CT imaging of the chest was performed using the standard protocol during bolus administration of intravenous contrast. Multiplanar CT image reconstructions and MIPs were obtained to evaluate the vascular anatomy. CONTRAST:  100 mL Omnipaque 350, intravenous COMPARISON:  None. FINDINGS: Cardiovascular: Satisfactory opacification of the pulmonary arteries to the segmental level. There are bilateral lobar nonocclusive acute pulmonary emboli extending into the upper and lower segmental and subsegmental branches bilaterally. The right to left ventricular ratio is 1.1. The main pulmonary artery measures up to 35 mm. No pericardial effusion. Mediastinum/Nodes: Multiple scattered mediastinal lymph nodes, none pathologic by size criteria. No axillary lymph nodes. Thyroid gland,  trachea, and esophagus demonstrate no significant findings. Lungs/Pleura: Multifocal diffuse, mid lung and peripheral predominant ground-glass and consolidative opacities no pleural effusion or pneumothorax. Upper Abdomen: The visualized upper abdomen is within normal limits. Musculoskeletal: No chest wall abnormality. No acute or significant osseous findings. Review of the MIP images confirms the above findings. IMPRESSION: Vascular: Acute bilateral pulmonary emboli extending from the bilateral upper and lower lobar to subsegmental branches. CT evidence of significant pulmonary hypertension resulting in right heart strain (RV:LV = 1.1). Non-Vascular: Diffuse bilateral mid lung and peripheral predominant ground-glass and consolidative opacities most consistent with multifocal pneumonia associated with COVID-19. These results were called by telephone at the time of interpretation on 07/13/2020 at 2:42 pm to provider [facility] , who verbally acknowledged these results. Ruthann Cancer, MD [facility] Electronically Signed   By: Ruthann Cancer MD   On: 07/13/2020 14:45   MR BRAIN WO CONTRAST  Result Date: 07/11/2020 CLINICAL DATA:  Stroke follow-up.  Status post tPA EXAM: MRI HEAD WITHOUT CONTRAST TECHNIQUE: Multiplanar, multiecho pulse sequences of the brain and surrounding structures were obtained without intravenous contrast. COMPARISON:  None. FINDINGS: Brain: Multifocal acute ischemia in the right MCA territory, predominantly in the posterior parietal lobe. No contralateral ischemia. No acute or chronic hemorrhage. Normal white matter signal, parenchymal volume and CSF spaces. The midline structures are normal. Vascular: Major flow voids are preserved. Skull and upper cervical spine: Normal calvarium and skull base. Visualized upper cervical spine and soft tissues are normal. Sinuses/Orbits:Moderate sinus mucosal thickening diffusely. Normal  orbits.  IMPRESSION: Multifocal acute ischemia in the right MCA territory, predominantly in the posterior parietal lobe. No hemorrhage or mass effect. Electronically Signed   By: Ulyses Jarred M.D.   On: 07/11/2020 23:02   DG Chest Port 1 View  Result Date: 07/12/2020 CLINICAL DATA:  COVID positive, shortness of breath EXAM: PORTABLE CHEST 1 VIEW COMPARISON:  2015 FINDINGS: Low lung volumes. No consolidation or edema. No pleural effusion or pneumothorax. Cardiomediastinal contours are likely within normal limits for technique. IMPRESSION: No acute process in the chest. Electronically Signed   By: Macy Mis M.D.   On: 07/12/2020 14:02   VAS [place] TRANSCRANIAL DOPPLER W BUBBLES  Result Date: 07/12/2020  Transcranial Doppler with Bubble Indications: Stroke. Performing Technologist: Darlin Coco RDMS  Examination Guidelines: A complete evaluation includes B-mode imaging, spectral Doppler, color Doppler, and power Doppler as needed of all accessible portions of each vessel. Bilateral testing is considered an integral part of a complete examination. Limited examinations for reoccurring indications may be performed as noted.  Summary: No HITS at rest or during Valsalva. Negative transcranial Doppler Bubble study with no evidence of right to left intracardiac communication.  A vascular evaluation was performed. The left middle cerebral artery was studied. An IV was inserted into the patient's right forearm. Verbal informed consent was obtained.  *See table(s) above for TCD measurements and observations.  Diagnosing physician: Rosalin Hawking MD Electronically signed by Rosalin Hawking MD on 07/12/2020 at 8:20:20 PM.    Final    CT HEAD CODE STROKE WO CONTRAST  Result Date: 07/09/2020 CLINICAL DATA:  Code stroke.  Left-sided weakness EXAM: CT HEAD WITHOUT CONTRAST TECHNIQUE: Contiguous axial images were obtained from the base of the skull through the vertex without intravenous contrast. COMPARISON:  None. FINDINGS: Brain:  There is no acute intracranial hemorrhage, mass effect, or edema. Gray-white differentiation is preserved. Ventricles and sulci are normal in size and configuration. No extra-axial collection. Vascular: No hyperdense vessel. Skull: Unremarkable. Sinuses/Orbits: Diffuse paranasal sinus mucosal thickening. Orbits are unremarkable. Other: Mastoid air cells are clear. ASPECTS ([place] Stroke Program Early CT Score) - Ganglionic level infarction (caudate, lentiform nuclei, internal capsule, insula, M1-M3 cortex): 7 - Supraganglionic infarction (M4-M6 cortex): 3 Total score (0-10 with 10 being normal): 10 IMPRESSION: There is no acute intracranial hemorrhage or evidence of acute infarction. ASPECT score is 10. These results were called by telephone at the time of interpretation on 07/09/2020 at 7:50 pm to provider [facility] ZAM[facility] , who verbally acknowledged these results. Electronically Signed   By: Macy Mis M.D.   On: 07/09/2020 19:52   VAS [place] LOWER EXTREMITY VENOUS (DVT)  Result Date: 07/13/2020  Lower Venous DVT Study Indications: Stroke, hormonal birth control, Covid-19.  Comparison Study: No prior studies. Performing Technologist: Darlin Coco RDMS  Examination Guidelines: A complete evaluation includes B-mode imaging, spectral Doppler, color Doppler, and power Doppler as needed of all accessible portions of each vessel. Bilateral testing is considered an integral part of a complete examination. Limited examinations for reoccurring indications may be performed as noted. The reflux portion of the exam is performed with the patient in reverse Trendelenburg.  +---------+---------------+---------+-----------+----------+--------------+  RIGHT     Compressibility Phasicity Spontaneity Properties Thrombus Aging  +---------+---------------+---------+-----------+----------+--------------+  CFV       Full            Yes       Yes                                     +---------+---------------+---------+-----------+----------+--------------+  SFJ       Full                                                             +---------+---------------+---------+-----------+----------+--------------+  FV Prox   Full                                                             +---------+---------------+---------+-----------+----------+--------------+  FV Mid    Full                                                             +---------+---------------+---------+-----------+----------+--------------+  FV Distal Full                                                             +---------+---------------+---------+-----------+----------+--------------+  PFV       Full                                                             +---------+---------------+---------+-----------+----------+--------------+  POP       Full            Yes       Yes                                    +---------+---------------+---------+-----------+----------+--------------+  PTV       Full                                                             +---------+---------------+---------+-----------+----------+--------------+  PERO      Full                                                             +---------+---------------+---------+-----------+----------+--------------+   +---------+---------------+---------+-----------+----------+--------------+  LEFT      Compressibility Phasicity Spontaneity Properties Thrombus Aging  +---------+---------------+---------+-----------+----------+--------------+  CFV       Full            Yes       Yes                                    +---------+---------------+---------+-----------+----------+--------------+  SFJ       Full                                                             +---------+---------------+---------+-----------+----------+--------------+  FV Prox   Full                                                              +---------+---------------+---------+-----------+----------+--------------+  FV Mid    Full                                                             +---------+---------------+---------+-----------+----------+--------------+  FV Distal Full                                                             +---------+---------------+---------+-----------+----------+--------------+  PFV       Full                                                             +---------+---------------+---------+-----------+----------+--------------+  POP       Full            Yes       Yes                                    +---------+---------------+---------+-----------+----------+--------------+  PTV       Full                                                             +---------+---------------+---------+-----------+----------+--------------+  PERO      Full                                                             +---------+---------------+---------+-----------+----------+--------------+     Summary: RIGHT: - There is no evidence of deep vein thrombosis in the lower extremity.  - No cystic structure found in the popliteal fossa.  LEFT: - There is no evidence of deep vein thrombosis in the lower extremity.  - No cystic structure found in the popliteal fossa.  *See table(s)  above for measurements and observations. Electronically signed by Monica Martinez MD on 07/13/2020 at 4:53:52 PM.    Final    ECHOCARDIOGRAM LIMITED  Result Date: 07/11/2020    ECHOCARDIOGRAM LIMITED REPORT   Patient Name:   April Carney Date of Exam: 07/11/2020 Medical Rec #:  [id number]         Height:       64.0 in Accession #:    [id number]        Weight:       265.4 lb Date of Birth:  06-13-1981        BSA:          2.206 m Patient Age:    39 years          BP:           133/66 mmHg Patient Gender: F                 HR:           91 bpm. Exam Location:  Inpatient Procedure: Limited Echo, Cardiac Doppler and Color Doppler Indications:    CVA   History:        Patient has no prior history of Echocardiogram examinations.                 Stroke; Risk Factors:Hypertension, Diabetes, Dyslipidemia and                 Obesity. COVID+.  Sonographer:    Dustin Flock Referring Phys: [place]  1. Left ventricular ejection fraction, by estimation, is 55 to 60%. The left ventricle has normal function. Left ventricular endocardial border not optimally defined to evaluate regional wall motion. Left ventricular diastolic parameters are consistent with Grade I diastolic dysfunction (impaired relaxation).  2. Right ventricular systolic function is normal. Tricuspid regurgitation signal is inadequate for assessing PA pressure.  3. The mitral valve is normal in structure. Trivial mitral valve regurgitation.  4. The aortic valve is normal in structure. FINDINGS  Left Ventricle: Left ventricular ejection fraction, by estimation, is 55 to 60%. The left ventricle has normal function. Left ventricular endocardial border not optimally defined to evaluate regional wall motion. Left ventricular diastolic parameters are consistent with Grade I diastolic dysfunction (impaired relaxation). Normal left ventricular filling pressure. Right Ventricle: Right ventricular systolic function is normal. Tricuspid regurgitation signal is inadequate for assessing PA pressure. Left Atrium: Left atrial size was not well visualized. Right Atrium: Right atrial size was not well visualized. Pericardium: There is no evidence of pericardial effusion. Mitral Valve: The mitral valve is normal in structure. Trivial mitral valve regurgitation. Tricuspid Valve: The tricuspid valve is normal in structure. Aortic Valve: The aortic valve is normal in structure. Pulmonic Valve: The pulmonic valve was not well visualized. Aorta: Aortic root could not be assessed. IAS/Shunts: The interatrial septum appears to be lipomatous. LEFT VENTRICLE PLAX 2D LVIDd:         5.10 cm Diastology LVIDs:          3.60 cm LV e' medial:    6.42 cm/s LV PW:         1.00 cm LV E/e' medial:  10.8 LV IVS:        1.00 cm LV e' lateral:   6.85 cm/s                        LV E/e' lateral: 10.1  RIGHT VENTRICLE RV S prime:  10.90 cm/s LEFT ATRIUM         Index LA diam:    4.00 cm 1.81 cm/m   AORTA Ao Root diam: 2.90 cm MITRAL VALVE MV Area (PHT): 8.62 cm MV Decel Time: 88 msec MV E velocity: 69.40 cm/s MV A velocity: 89.50 cm/s MV E/A ratio:  0.78 Fransico Him MD Electronically signed by Fransico Him MD Signature Date/Time: 07/11/2020/1:13:15 PM    Final     Micro Results     Recent Results (from the past 240 hour(s))  Resp Panel by RT-PCR (Flu A&B, Covid) Nasopharyngeal Swab     Status: Abnormal   Collection Time: 07/09/20  8:25 PM   Specimen: Nasopharyngeal Swab; Nasopharyngeal(NP) swabs in vial transport medium  Result Value Ref Range Status   SARS Coronavirus 2 by RT PCR POSITIVE (A) NEGATIVE Final    Comment: CRITICAL RESULT CALLED TO, READ BACK BY AND VERIFIED WITH: T EASTER AT 2151 ON 07/09/2020 BY MOSLEY,J (NOTE) SARS-CoV-2 target nucleic acids are DETECTED.  The SARS-CoV-2 RNA is generally detectable in upper respiratory specimens during the acute phase of infection. Positive results are indicative of the presence of the identified virus, but do not rule out bacterial infection or co-infection with other pathogens not detected by the test. Clinical correlation with patient history and other diagnostic information is necessary to determine patient infection status. The expected result is Negative.  Fact Sheet for Patients: EntrepreneurPulse.com.au  Fact Sheet for Healthcare Providers: IncredibleEmployment.be  This test is not yet approved or cleared by the [place] FDA and  has been authorized for detection and/or diagnosis of SARS-CoV-2 by FDA under an Emergency Use Authorization (EUA).  This EUA will remain in effect (meanin g this test can  be used) for the duration of  the COVID-19 declaration under Section 564(b)(1) of the Act, 21 U.S.C. section 360bbb-3(b)(1), unless the authorization is terminated or revoked sooner.     Influenza A by PCR NEGATIVE NEGATIVE Final   Influenza B by PCR NEGATIVE NEGATIVE Final    Comment: (NOTE) The Xpert Xpress SARS-CoV-2/FLU/RSV plus assay is intended as an aid in the diagnosis of influenza from Nasopharyngeal swab specimens and should not be used as a sole basis for treatment. Nasal washings and aspirates are unacceptable for Xpert Xpress SARS-CoV-2/FLU/RSV testing.  Fact Sheet for Patients: EntrepreneurPulse.com.au  Fact Sheet for Healthcare Providers: IncredibleEmployment.be  This test is not yet approved or cleared by the [place] FDA and has been authorized for detection and/or diagnosis of SARS-CoV-2 by FDA under an Emergency Use Authorization (EUA). This EUA will remain in effect (meaning this test can be used) for the duration of the COVID-19 declaration under Section 564(b)(1) of the Act, 21 U.S.C. section 360bbb-3(b)(1), unless the authorization is terminated or revoked.  Performed at [facility], [address], [place], [place] [address]   MRSA PCR Screening     Status: None   Collection Time: 07/10/20 11:32 AM   Specimen: Nasopharyngeal  Result Value Ref Range Status   MRSA by PCR NEGATIVE NEGATIVE Final    Comment:        The GeneXpert MRSA Assay (FDA approved for NASAL specimens only), is one component of a comprehensive MRSA colonization surveillance program. It is not intended to diagnose MRSA infection nor to guide or monitor treatment for MRSA infections. Performed at [facility], [place] [address], [place], [place] [address]     Today   Subjective    April Carney today has no headache,no  chest abdominal pain,no new weakness tingling or numbness, feels much better wants to go home today.      Objective   Blood pressure 116/72, pulse 73, temperature 97.8 F (36.6 C), temperature source Oral, resp. rate 18, height _0  (1.626 m), weight 120.4 kg, SpO2 100 %.  No intake or output data in the 24 hours ending 07/16/20 0924  Exam  Awake Alert, No new F.N deficits, Normal affect [place].AT,PERRAL Supple Neck,No JVD, No cervical lymphadenopathy appriciated.  Symmetrical Chest wall movement, Good air movement bilaterally, CTAB RRR,No Gallops,Rubs or new Murmurs, No Parasternal Heave +ve B.Sounds, Abd Soft, Non tender, No organomegaly appriciated, No rebound -guarding or rigidity. No Cyanosis, Clubbing or edema, No new Rash or bruise   Data Review   CBC w Diff:  Lab Results  Component Value Date   WBC 9.4 07/16/2020   HGB 8.3 (L) 07/16/2020   HGB 10.4 (L) 05/02/2019   HCT 28.7 (L) 07/16/2020   HCT 35.5 05/02/2019   PLT 447 (H) 07/16/2020   PLT 403 05/02/2019   LYMPHOPCT 38 07/16/2020   MONOPCT 9 07/16/2020   EOSPCT 3 07/16/2020   BASOPCT 0 07/16/2020    CMP:  Lab Results  Component Value Date   NA 135 07/16/2020   NA 139 01/09/2016   K 4.6 07/16/2020   CL 102 07/16/2020   CO2 21 (L) 07/16/2020   BUN 10 07/16/2020   BUN 8 01/09/2016   CREATININE 0.65 07/16/2020   CREATININE 0.65 07/14/2019   PROT 6.3 (L) 07/16/2020   PROT 6.7 01/09/2016   ALBUMIN 3.3 (L) 07/16/2020   ALBUMIN 4.2 01/09/2016   BILITOT 0.9 07/16/2020   BILITOT 0.4 01/09/2016   ALKPHOS 52 07/16/2020   AST 43 (H) 07/16/2020   ALT 49 (H) 07/16/2020  .   Total Time in preparing paper work, data evaluation and todays exam - 45 minutes  Lala Lund M.D on 07/16/2020 at 9:24 AM  [facility]

## 2020-07-16 NOTE — Progress Notes (Signed)
Discharge teaching complete. Meds, diet, activity, follow up appointments reviewed and all questions answered. Copy of instructions given to patient and meds given to patient from [facility].

## 2020-07-16 NOTE — Discharge Instructions (Addendum)
 COVID-19 COVID-19 is a respiratory infection that is caused by a virus called severe acute respiratory syndrome coronavirus 2 (SARS-CoV-2). The disease is also known as coronavirus disease or novel coronavirus. In some people, the virus may not cause any symptoms. In others, it may cause a serious infection. The infection can get worse quickly and can lead to complications, such as:  Pneumonia, or infection of the lungs.  Acute respiratory distress syndrome or ARDS. This is a condition in which fluid build-up in the lungs prevents the lungs from filling with air and passing oxygen into the blood.  Acute respiratory failure. This is a condition in which there is not enough oxygen passing from the lungs to the body or when carbon dioxide is not passing from the lungs out of the body.  Sepsis or septic shock. This is a serious bodily reaction to an infection.  Blood clotting problems.  Secondary infections due to bacteria or fungus.  Organ failure. This is when your body's organs stop working. The virus that causes COVID-19 is contagious. This means that it can spread from person to person through droplets from coughs and sneezes (respiratory secretions). What are the causes? This illness is caused by a virus. You may catch the virus by:  Breathing in droplets from an infected person. Droplets can be spread by a person breathing, speaking, singing, coughing, or sneezing.  Touching something, like a table or a doorknob, that was exposed to the virus (contaminated) and then touching your mouth, nose, or eyes. What increases the risk? Risk for infection You are more likely to be infected with this virus if you:  Are within 6 feet (2 meters) of a person with COVID-19.  Provide care for or live with a person who is infected with COVID-19.  Spend time in crowded indoor spaces or live in shared housing. Risk for serious illness You are more likely to become seriously ill from the virus if  you:  Are 50 years of age or older. The higher your age, the more you are at risk for serious illness.  Live in a nursing home or long-term care facility.  Have cancer.  Have a long-term (chronic) disease such as: ? Chronic lung disease, including chronic obstructive pulmonary disease or asthma. ? A long-term disease that lowers your body's ability to fight infection (immunocompromised). ? Heart disease, including heart failure, a condition in which the arteries that lead to the heart become narrow or blocked (coronary artery disease), a disease which makes the heart muscle thick, weak, or stiff (cardiomyopathy). ? Diabetes. ? Chronic kidney disease. ? Sickle cell disease, a condition in which red blood cells have an abnormal "sickle" shape. ? Liver disease.  Are obese. What are the signs or symptoms? Symptoms of this condition can range from mild to severe. Symptoms may appear any time from 2 to 14 days after being exposed to the virus. They include:  A fever or chills.  A cough.  Difficulty breathing.  Headaches, body aches, or muscle aches.  Runny or stuffy (congested) nose.  A sore throat.  New loss of taste or smell. Some people may also have stomach problems, such as nausea, vomiting, or diarrhea. Other people may not have any symptoms of COVID-19. How is this diagnosed? This condition may be diagnosed based on:  Your signs and symptoms, especially if: ? You live in an area with a COVID-19 outbreak. ? You recently traveled to or from an area where the virus is common. ?   You provide care for or live with a person who was diagnosed with COVID-19. ? You were exposed to a person who was diagnosed with COVID-19.  A physical exam.  Lab tests, which may include: ? Taking a sample of fluid from the back of your nose and throat (nasopharyngeal fluid), your nose, or your throat using a swab. ? A sample of mucus from your lungs (sputum). ? Blood tests.  Imaging tests,  which may include, X-rays, CT scan, or ultrasound. How is this treated? At present, there is no medicine to treat COVID-19. Medicines that treat other diseases are being used on a trial basis to see if they are effective against COVID-19. Your health care provider will talk with you about ways to treat your symptoms. For most people, the infection is mild and can be managed at home with rest, fluids, and over-the-counter medicines. Treatment for a serious infection usually takes places in a hospital intensive care unit (ICU). It may include one or more of the following treatments. These treatments are given until your symptoms improve.  Receiving fluids and medicines through an IV.  Supplemental oxygen. Extra oxygen is given through a tube in the nose, a face mask, or a hood.  Positioning you to lie on your stomach (prone position). This makes it easier for oxygen to get into the lungs.  Continuous positive airway pressure (CPAP) or bi-level positive airway pressure (BPAP) machine. This treatment uses mild air pressure to keep the airways open. A tube that is connected to a motor delivers oxygen to the body.  Ventilator. This treatment moves air into and out of the lungs by using a tube that is placed in your windpipe.  Tracheostomy. This is a procedure to create a hole in the neck so that a breathing tube can be inserted.  Extracorporeal membrane oxygenation (ECMO). This procedure gives the lungs a chance to recover by taking over the functions of the heart and lungs. It supplies oxygen to the body and removes carbon dioxide. Follow these instructions at home: Lifestyle  If you are sick, stay home except to get medical care. Your health care provider will tell you how long to stay home. Call your health care provider before you go for medical care.  Rest at home as told by your health care provider.  Do not use any products that contain nicotine or tobacco, such as cigarettes,  e-cigarettes, and chewing tobacco. If you need help quitting, ask your health care provider.  Return to your normal activities as told by your health care provider. Ask your health care provider what activities are safe for you. General instructions  Take over-the-counter and prescription medicines only as told by your health care provider.  Drink enough fluid to keep your urine pale yellow.  Keep all follow-up visits as told by your health care provider. This is important. How is this prevented?  There is no vaccine to help prevent COVID-19 infection. However, there are steps you can take to protect yourself and others from this virus. To protect yourself:   Do not travel to areas where COVID-19 is a risk. The areas where COVID-19 is reported change often. To identify high-risk areas and travel restrictions, check the CDC travel website: wwwnc.cdc.gov/travel/notices  If you live in, or must travel to, an area where COVID-19 is a risk, take precautions to avoid infection. ? Stay away from people who are sick. ? Wash your hands often with soap and water for 20 seconds. If soap and   water are not available, use an alcohol-based hand sanitizer. ? Avoid touching your mouth, face, eyes, or nose. ? Avoid going out in public, follow guidance from your state and local health authorities. ? If you must go out in public, wear a cloth face covering or face mask. Make sure your mask covers your nose and mouth. ? Avoid crowded indoor spaces. Stay at least 6 feet (2 meters) away from others. ? Disinfect objects and surfaces that are frequently touched every day. This may include:  Counters and tables.  Doorknobs and light switches.  Sinks and faucets.  Electronics, such as phones, remote controls, keyboards, computers, and tablets. To protect others: If you have symptoms of COVID-19, take steps to prevent the virus from spreading to others.  If you think you have a COVID-19 infection, contact  your health care provider right away. Tell your health care team that you think you may have a COVID-19 infection.  Stay home. Leave your house only to seek medical care. Do not use public transport.  Do not travel while you are sick.  Wash your hands often with soap and water for 20 seconds. If soap and water are not available, use alcohol-based hand sanitizer.  Stay away from other members of your household. Let healthy household members care for children and pets, if possible. If you have to care for children or pets, wash your hands often and wear a mask. If possible, stay in your own room, separate from others. Use a different bathroom.  Make sure that all people in your household wash their hands well and often.  Cough or sneeze into a tissue or your sleeve or elbow. Do not cough or sneeze into your hand or into the air.  Wear a cloth face covering or face mask. Make sure your mask covers your nose and mouth. Where to find more information  Centers for Disease Control and Prevention: www.cdc.gov/coronavirus/2019-ncov/index.html  World Health Organization: www.who.int/health-topics/coronavirus Contact a health care provider if:  You live in or have traveled to an area where COVID-19 is a risk and you have symptoms of the infection.  You have had contact with someone who has COVID-19 and you have symptoms of the infection. Get help right away if:  You have trouble breathing.  You have pain or pressure in your chest.  You have confusion.  You have bluish lips and fingernails.  You have difficulty waking from sleep.  You have symptoms that get worse. These symptoms may represent a serious problem that is an emergency. Do not wait to see if the symptoms will go away. Get medical help right away. Call your local emergency services (911 in the [place] Do not drive yourself to the hospital. Let the emergency medical personnel know if you think you have  COVID-19. Summary  COVID-19 is a respiratory infection that is caused by a virus. It is also known as coronavirus disease or novel coronavirus. It can cause serious infections, such as pneumonia, acute respiratory distress syndrome, acute respiratory failure, or sepsis.  The virus that causes COVID-19 is contagious. This means that it can spread from person to person through droplets from breathing, speaking, singing, coughing, or sneezing.  You are more likely to develop a serious illness if you are 50 years of age or older, have a weak immune system, live in a nursing home, or have chronic disease.  There is no medicine to treat COVID-19. Your health care provider will talk with you about ways to treat your   symptoms.  Take steps to protect yourself and others from infection. Wash your hands often and disinfect objects and surfaces that are frequently touched every day. Stay away from people who are sick and wear a mask if you are sick. This information is not intended to replace advice given to you by your health care provider. Make sure you discuss any questions you have with your health care provider. Document Revised: 05/13/2019 Document Reviewed: 08/19/2018 Elsevier Patient Education  2020 [employer] Follow with Primary MD in 7 days   Get CBC, CMP, 2 view Chest X ray -  checked next visit within 1 week by Primary MD    Activity: As tolerated with Full fall precautions use walker/cane & assistance as needed  Disposition Home   Diet: Heart Healthy    Special Instructions: If you have smoked or chewed Tobacco  in the last 2 yrs please stop smoking, stop any regular Alcohol  and or any Recreational drug use.  On your next visit with your primary care physician please Get Medicines reviewed and adjusted.  Please request your Prim.MD to go over all Hospital Tests and Procedure/Radiological results at the follow up, please get all Hospital records sent to your Prim MD by signing  hospital release before you go home.  If you experience worsening of your admission symptoms, develop shortness of breath, life threatening emergency, suicidal or homicidal thoughts you must seek medical attention immediately by calling 911 or calling your MD immediately  if symptoms less severe.  You Must read complete instructions/literature along with all the possible adverse reactions/side effects for all the Medicines you take and that have been prescribed to you. Take any new Medicines after you have completely understood and accpet all the possible adverse reactions/side effects.          Person Under Monitoring Name: April Carney  [place] [address] [place] [id number]   Infection Prevention Recommendations for Individuals Confirmed to have, or Being Evaluated for, 2019 Novel Coronavirus (COVID-19) Infection Who Receive Care at Home  Individuals who are confirmed to have, or are being evaluated for, COVID-19 should follow the prevention steps below until a healthcare provider or local or state health department says they can return to normal activities.  Stay home except to get medical care You should restrict activities outside your home, except for getting medical care. Do not go to work, school, or public areas, and do not use public transportation or taxis.  Call ahead before visiting your doctor Before your medical appointment, call the healthcare provider and tell them that you have, or are being evaluated for, COVID-19 infection. This will help the healthcare provider's office take steps to keep other people from getting infected. Ask your healthcare provider to call the local or state health department.  Monitor your symptoms Seek prompt medical attention if your illness is worsening (e.g., difficulty breathing). Before going to your medical appointment, call the healthcare provider and tell them that you have, or are being evaluated for, COVID-19  infection. Ask your healthcare provider to call the local or state health department.  Wear a facemask You should wear a facemask that covers your nose and mouth when you are in the same room with other people and when you visit a healthcare provider. People who live with or visit you should also wear a facemask while they are in the same room with you.  Separate yourself from other people in your home As much as possible, you should  stay in a different room from other people in your home. Also, you should use a separate bathroom, if available.  Avoid sharing household items You should not share dishes, drinking glasses, cups, eating utensils, towels, bedding, or other items with other people in your home. After using these items, you should wash them thoroughly with soap and water.  Cover your coughs and sneezes Cover your mouth and nose with a tissue when you cough or sneeze, or you can cough or sneeze into your sleeve. Throw used tissues in a lined trash can, and immediately wash your hands with soap and water for at least 20 seconds or use an alcohol-based hand rub.  Wash your [employer] your hands often and thoroughly with soap and water for at least 20 seconds. You can use an alcohol-based hand sanitizer if soap and water are not available and if your hands are not visibly dirty. Avoid touching your eyes, nose, and mouth with unwashed hands.   Prevention Steps for Caregivers and Household Members of Individuals Confirmed to have, or Being Evaluated for, COVID-19 Infection Being Cared for in the Home  If you live with, or provide care at home for, a person confirmed to have, or being evaluated for, COVID-19 infection please follow these guidelines to prevent infection:  Follow healthcare provider's instructions Make sure that you understand and can help the patient follow any healthcare provider instructions for all care.  Provide for the patient's basic needs You should  help the patient with basic needs in the home and provide support for getting groceries, prescriptions, and other personal needs.  Monitor the patient's symptoms If they are getting sicker, call his or her medical provider and tell them that the patient has, or is being evaluated for, COVID-19 infection. This will help the healthcare provider's office take steps to keep other people from getting infected. Ask the healthcare provider to call the local or state health department.  Limit the number of people who have contact with the patient  If possible, have only one caregiver for the patient.  Other household members should stay in another home or place of residence. If this is not possible, they should stay  in another room, or be separated from the patient as much as possible. Use a separate bathroom, if available.  Restrict visitors who do not have an essential need to be in the home.  Keep older adults, very young children, and other sick people away from the patient Keep older adults, very young children, and those who have compromised immune systems or chronic health conditions away from the patient. This includes people with chronic heart, lung, or kidney conditions, diabetes, and cancer.  Ensure good ventilation Make sure that shared spaces in the home have good air flow, such as from an air conditioner or an opened window, weather permitting.  Wash your hands often  Wash your hands often and thoroughly with soap and water for at least 20 seconds. You can use an alcohol based hand sanitizer if soap and water are not available and if your hands are not visibly dirty.  Avoid touching your eyes, nose, and mouth with unwashed hands.  Use disposable paper towels to dry your hands. If not available, use dedicated cloth towels and replace them when they become wet.  Wear a facemask and gloves  Wear a disposable facemask at all times in the room and gloves when you touch or have  contact with the patient's blood, body fluids, and/or secretions or  excretions, such as sweat, saliva, sputum, nasal mucus, vomit, urine, or feces.  Ensure the mask fits over your nose and mouth tightly, and do not touch it during use.  Throw out disposable facemasks and gloves after using them. Do not reuse.  Wash your hands immediately after removing your facemask and gloves.  If your personal clothing becomes contaminated, carefully remove clothing and launder. Wash your hands after handling contaminated clothing.  Place all used disposable facemasks, gloves, and other waste in a lined container before disposing them with other household waste.  Remove gloves and wash your hands immediately after handling these items.  Do not share dishes, glasses, or other household items with the patient  Avoid sharing household items. You should not share dishes, drinking glasses, cups, eating utensils, towels, bedding, or other items with a patient who is confirmed to have, or being evaluated for, COVID-19 infection.  After the person uses these items, you should wash them thoroughly with soap and water.  Wash laundry thoroughly  Immediately remove and wash clothes or bedding that have blood, body fluids, and/or secretions or excretions, such as sweat, saliva, sputum, nasal mucus, vomit, urine, or feces, on them.  Wear gloves when handling laundry from the patient.  Read and follow directions on labels of laundry or clothing items and detergent. In general, wash and dry with the warmest temperatures recommended on the label.  Clean all areas the individual has used often  Clean all touchable surfaces, such as counters, tabletops, doorknobs, bathroom fixtures, toilets, phones, keyboards, tablets, and bedside tables, every day. Also, clean any surfaces that may have blood, body fluids, and/or secretions or excretions on them.  Wear gloves when cleaning surfaces the patient has come in contact  with.  Use a diluted bleach solution (e.g., dilute bleach with 1 part bleach and 10 parts water) or a household disinfectant with a label that says EPA-registered for coronaviruses. To make a bleach solution at home, add 1 tablespoon of bleach to 1 quart (4 cups) of water. For a larger supply, add  cup of bleach to 1 gallon (16 cups) of water.  Read labels of cleaning products and follow recommendations provided on product labels. Labels contain instructions for safe and effective use of the cleaning product including precautions you should take when applying the product, such as wearing gloves or eye protection and making sure you have good ventilation during use of the product.  Remove gloves and wash hands immediately after cleaning.  Monitor yourself for signs and symptoms of illness Caregivers and household members are considered close contacts, should monitor their health, and will be asked to limit movement outside of the home to the extent possible. Follow the monitoring steps for close contacts listed on the symptom monitoring form.   ? If you have additional questions, contact your local health department or call the epidemiologist on call at [phone] (available 24/7). ? This guidance is subject to change. For the most up-to-date guidance from [facility], please refer to their website: TripMetro.hu  Information on my medicine - ELIQUIS (apixaban)  This medication education was reviewed with me or my healthcare representative as part of my discharge preparation.    Why was Eliquis prescribed for you? Eliquis was prescribed to treat blood clots that may have been found in the veins of your legs (deep vein thrombosis) or in your lungs (pulmonary embolism) and to reduce the risk of them occurring again.  What do You need to know about Eliquis ? The starting  dose is 10 mg (two 5 mg tablets) taken TWICE daily for the FIRST  SEVEN (7) DAYS, then on 07/22/20  the dose is reduced to ONE 5 mg tablet taken TWICE daily.  Eliquis may be taken with or without food.   Try to take the dose about the same time in the morning and in the evening. If you have difficulty swallowing the tablet whole please discuss with your pharmacist how to take the medication safely.  Take Eliquis exactly as prescribed and DO NOT stop taking Eliquis without talking to the doctor who prescribed the medication.  Stopping may increase your risk of developing a new blood clot.  Refill your prescription before you run out.  After discharge, you should have regular check-up appointments with your healthcare provider that is prescribing your Eliquis.    What do you do if you miss a dose? If a dose of ELIQUIS is not taken at the scheduled time, take it as soon as possible on the same day and twice-daily administration should be resumed. The dose should not be doubled to make up for a missed dose.  Important Safety Information A possible side effect of Eliquis is bleeding. You should call your healthcare provider right away if you experience any of the following: ? Bleeding from an injury or your nose that does not stop. ? Unusual colored urine (red or dark brown) or unusual colored stools (red or black). ? Unusual bruising for unknown reasons. ? A serious fall or if you hit your head (even if there is no bleeding).  Some medicines may interact with Eliquis and might increase your risk of bleeding or clotting while on Eliquis. To help avoid this, consult your healthcare provider or pharmacist prior to using any new prescription or non-prescription medications, including herbals, vitamins, non-steroidal anti-inflammatory drugs (NSAIDs) and supplements.  This website has more information on Eliquis (apixaban): http://www.eliquis.com/eliquis/home

## 2020-07-16 NOTE — TOC Transition Note (Signed)
Transition of Care [facility]) - CM/SW Discharge Note   Patient Details  Name: April Carney MRN: [id number] Date of Birth: July 29, 1980  [facility] CM/SW Contact:  Kermit Balo, RN Phone Number: 07/16/2020, 12:41 PM   Clinical Narrative:    Pt is discharging home with self care. No f/u per PT/OT.  Medications for home to be delivered to the room per [facility] pharmacy.  Pt has transport home.    Final next level of care: Home/Self Care Barriers to Discharge: No Barriers Identified   Patient Goals and CMS Choice        Discharge Placement                       Discharge Plan and Services   Discharge Planning Services: CM Consult                                 Social Determinants of Health (SDOH) Interventions     Readmission Risk Interventions No flowsheet data found.

## 2020-07-17 ENCOUNTER — Telehealth: Payer: Self-pay

## 2020-07-17 NOTE — Telephone Encounter (Signed)
Transition Care Management Follow-up Telephone Call  Date of discharge and from where: 07/16/2020 from [facility]  How have you been since you were released from the hospital? Patient states that she is feeling well.   Any questions or concerns? No  Items Reviewed:  Did the pt receive and understand the discharge instructions provided? Yes   Medications obtained and verified? Yes   Other? No   Any new allergies since your discharge? No   Dietary orders reviewed? Health Healthy and Low Carb  Do you have support at home? Yes   Functional Questionnaire: (I = Independent and D = Dependent) ADLs: I  Bathing/Dressing- I  Meal Prep- I  Eating- I  Maintaining continence- I  Transferring/Ambulation- I  Managing Meds- I   Follow up appointments reviewed:   PCP Hospital f/u appt confirmed? No  Scheduled to see Dr. Cyndra Numbers in [place] on 07/19/2020.  Specialist Hospital f/u appt confirmed? No   Are transportation arrangements needed? No   If their condition worsens, is the pt aware to call PCP or go to the Emergency Dept.? Yes  Was the patient provided with contact information for the PCP's office or ED? Yes  Was to pt encouraged to call back with questions or concerns? Yes

## 2020-07-19 DIAGNOSIS — Z86711 Personal history of pulmonary embolism: Secondary | ICD-10-CM | POA: Insufficient documentation

## 2020-07-19 DIAGNOSIS — E039 Hypothyroidism, unspecified: Secondary | ICD-10-CM | POA: Diagnosis not present

## 2020-07-19 DIAGNOSIS — Z09 Encounter for follow-up examination after completed treatment for conditions other than malignant neoplasm: Secondary | ICD-10-CM | POA: Diagnosis not present

## 2020-07-19 DIAGNOSIS — D500 Iron deficiency anemia secondary to blood loss (chronic): Secondary | ICD-10-CM | POA: Insufficient documentation

## 2020-07-19 DIAGNOSIS — Z8673 Personal history of transient ischemic attack (TIA), and cerebral infarction without residual deficits: Secondary | ICD-10-CM | POA: Insufficient documentation

## 2020-07-19 DIAGNOSIS — E119 Type 2 diabetes mellitus without complications: Secondary | ICD-10-CM | POA: Diagnosis not present

## 2020-07-19 DIAGNOSIS — Z8616 Personal history of COVID-19: Secondary | ICD-10-CM | POA: Diagnosis not present

## 2020-07-19 DIAGNOSIS — U071 COVID-19: Secondary | ICD-10-CM | POA: Diagnosis not present

## 2020-07-27 ENCOUNTER — Encounter (INDEPENDENT_AMBULATORY_CARE_PROVIDER_SITE_OTHER): Payer: Self-pay

## 2020-07-28 ENCOUNTER — Encounter (INDEPENDENT_AMBULATORY_CARE_PROVIDER_SITE_OTHER): Payer: Self-pay

## 2020-08-14 ENCOUNTER — Telehealth (HOSPITAL_COMMUNITY): Payer: Self-pay

## 2020-08-14 NOTE — Telephone Encounter (Signed)
Pharmacy Transitions of Care Follow-up Telephone Call  Attempted to call patient 08/14/2020 for a pharmacy transitions of care follow-up call. Patient was unavailable but a HIPAA appropriate voicemail was left. Will follow-up in two days.  Attempted to contact office for Eliquis refill. Will follow-up tomorrow with [facility] office.  Filbert Schilder  [facility] PharmD Candidate [phone]

## 2020-08-14 NOTE — Telephone Encounter (Signed)
Pharmacy Transitions of Care Follow-up Telephone Call  Date of discharge: 07/16/2020  Discharge Diagnosis: stroke  How have you been since you were released from the hospital? Fine - no complaints   Medication changes made at discharge: yes   Medication changes obtained and verified? yes    Medication Accessibility:  Home Pharmacy: [facility] [phone])  Was the patient provided with refills on discharged medications? no   Have all prescriptions been transferred from [facility] to home pharmacy? yes   Is the patient able to afford medications? yes    Medication Review:  APIXABEN (ELIQUIS)  Apixaban 5 mg BID initiated on 07/16/2021.  - Discussed importance of taking medication around the same time everyday  - Reviewed potential DDIs with patient  - Advised patient of medications to avoid (NSAIDs, ASA)  - Educated that Tylenol (acetaminophen) will be the preferred analgesic to prevent risk of bleeding  - Emphasized importance of monitoring for signs and symptoms of bleeding (abnormal bruising, prolonged bleeding, nose bleeds, bleeding from gums, discolored urine, black tarry stools)  - Advised patient to alert all providers of anticoagulation therapy prior to starting a new medication or having a procedure    Follow-up Appointments:  PCP Hospital f/u appt confirmed? Yes - patient saw PCP Dr. Shellia Carwin at [facility] 07/19/2020 and has another appointment 08/30/2020 at 09:00.    Specialist Hospital f/u appt confirmed? Yes, scheduled to see Dr. Pearlean Brownie on 09/19/2020 at 08:30.   If their condition worsens, is the pt aware to call PCP or go to the Emergency Dept.? yes  Final Patient Assessment: Patient was in good spirits over the phone. Didn't report any side effects to new medications and doesn't have problems affording medications.

## 2020-08-15 NOTE — Telephone Encounter (Signed)
Reviewed and agree with Emma's documentation.   Theodis Sato, PharmD PGY-1 [facility] Pharm[facility] Resident   08/15/2020 12:54 PM

## 2020-08-24 IMAGING — US US ABDOMEN COMPLETE
1 series · 14 of 25 positions shown · non-contrast
Comparison: None.

CLINICAL DATA: Elevated liver function tests.  Morbid obesity.

EXAM:
ABDOMEN ULTRASOUND COMPLETE

[Series 1: us abdomen complete · 14 of 111 slices shown]
[im 1/111]
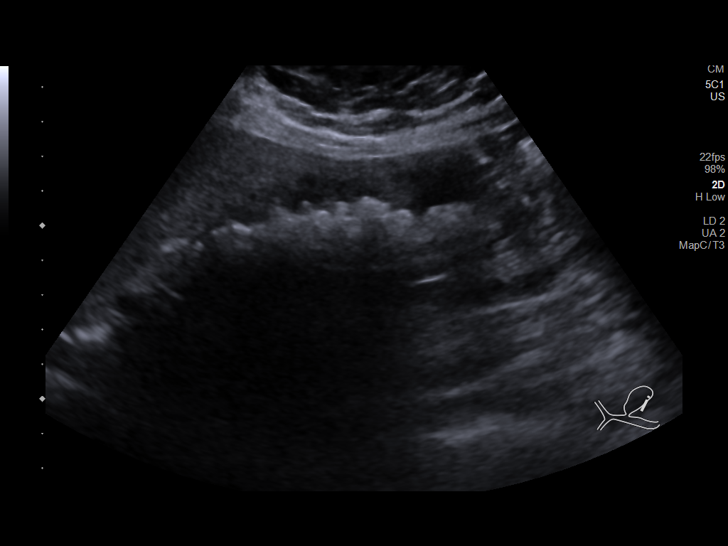
[im 10/111]
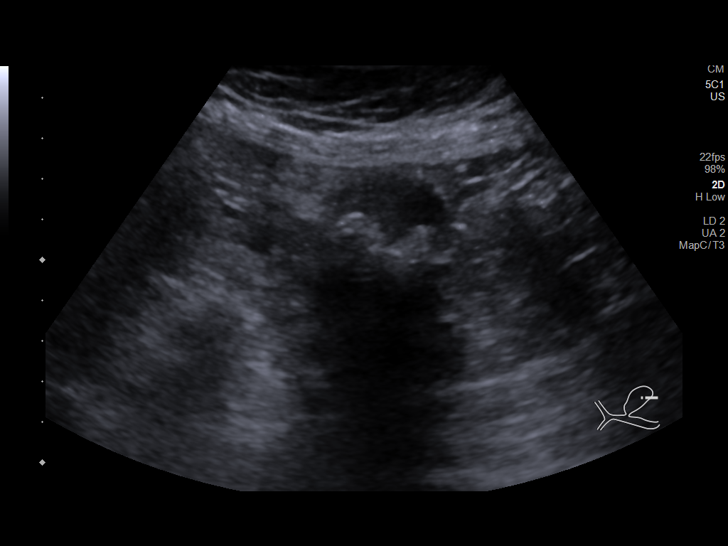
[im 19/111]
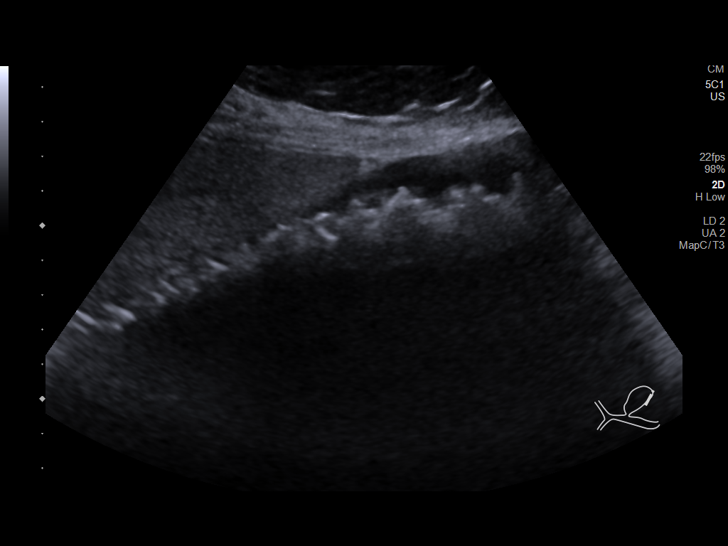
[im 28/111]
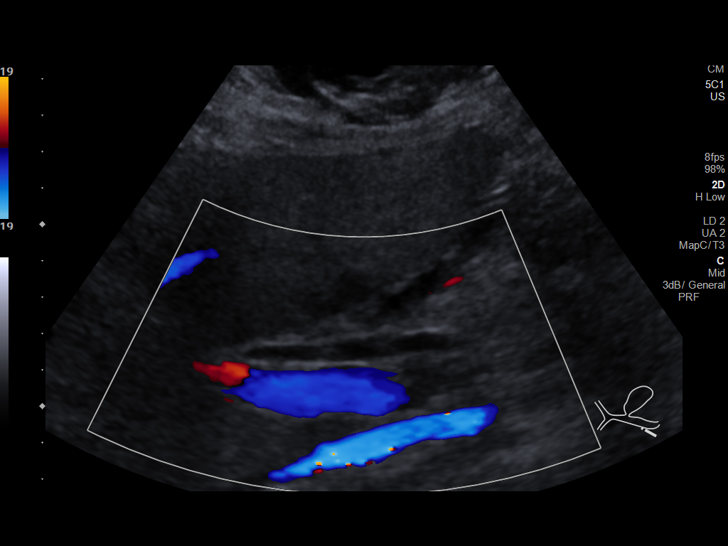
[im 37/111]
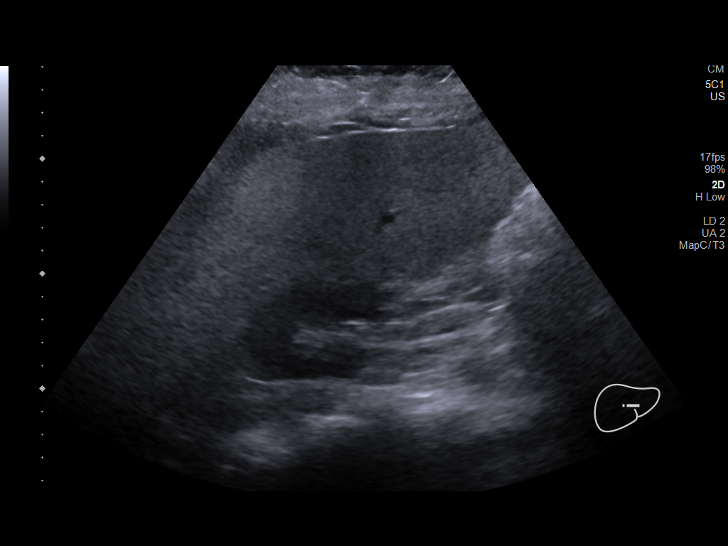
[im 42/111]
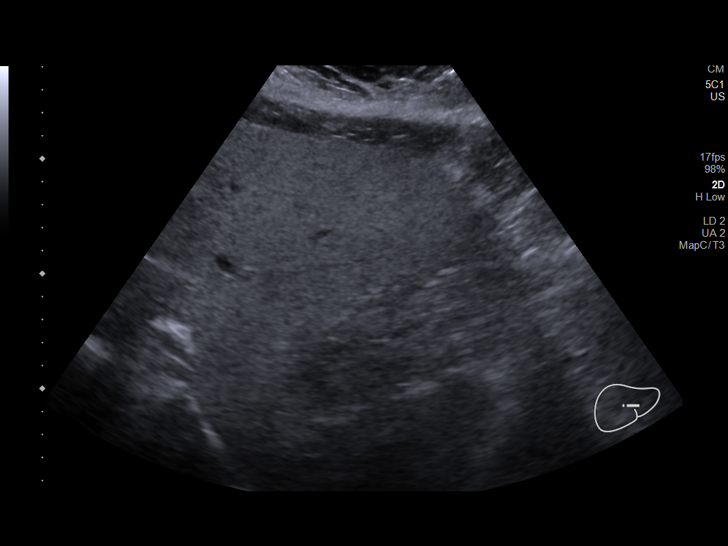
[im 51/111]
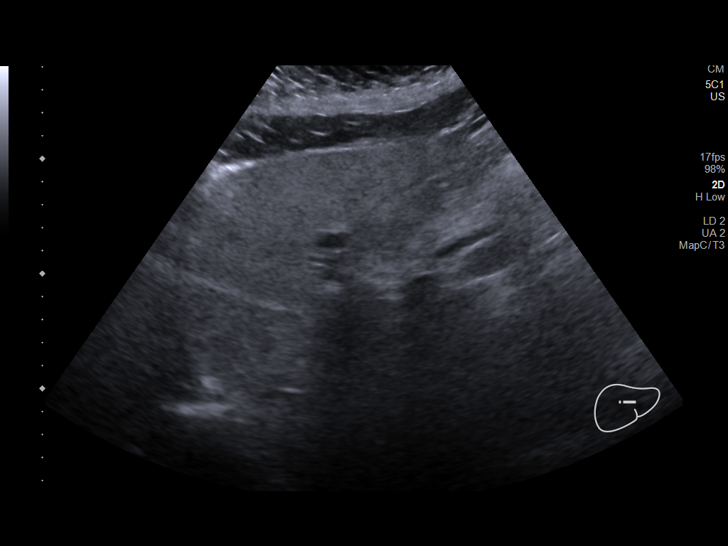
[im 60/111]
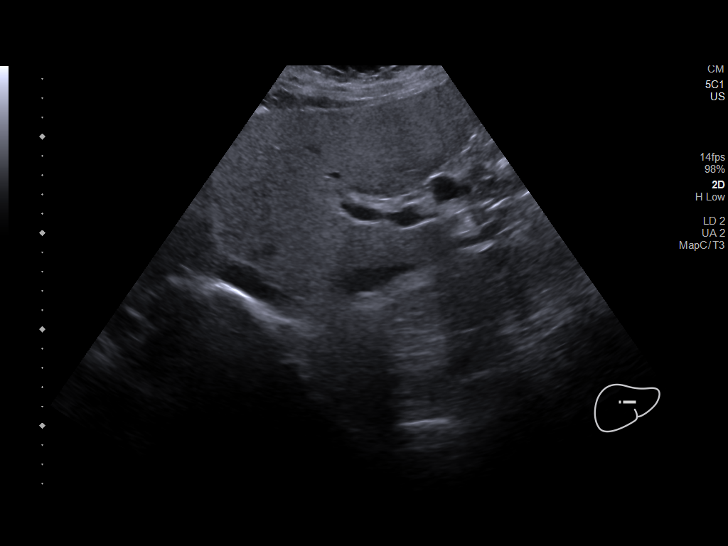
[im 69/111]
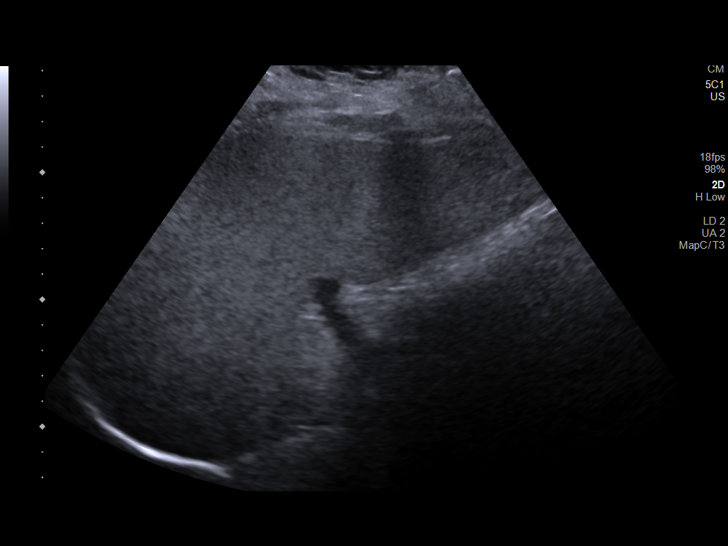
[im 74/111]
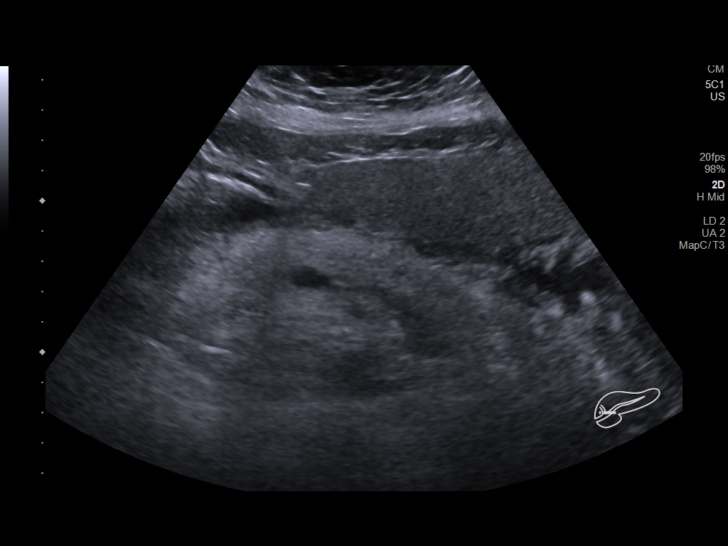
[im 83/111]
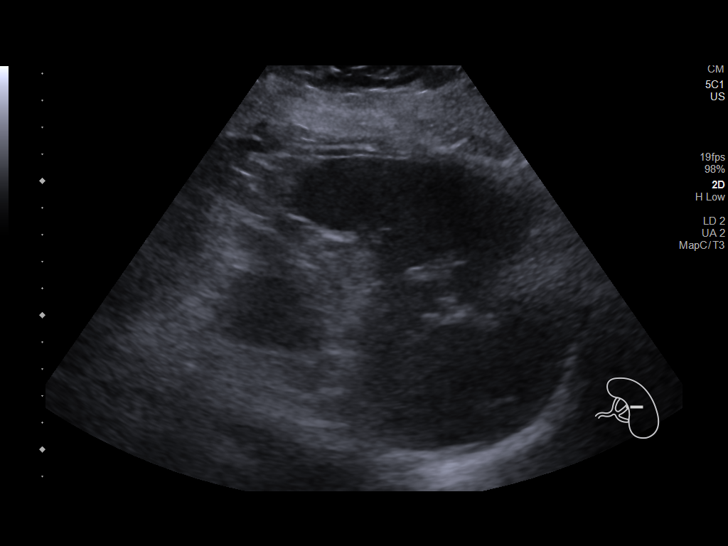
[im 92/111]
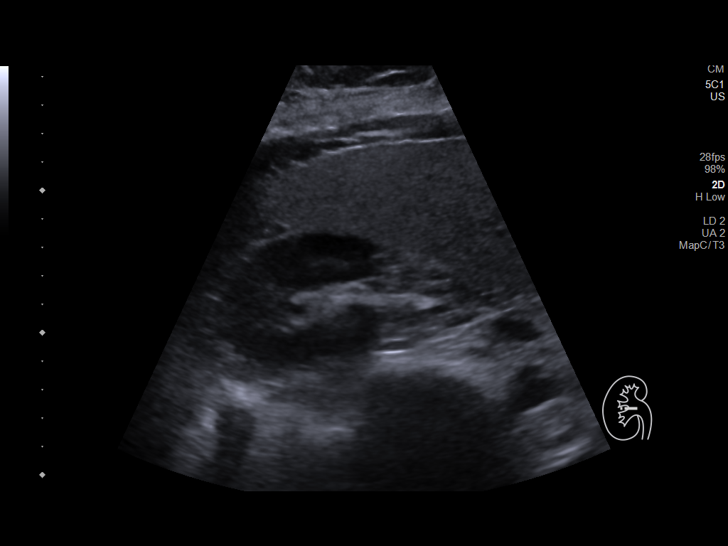
[im 101/111]
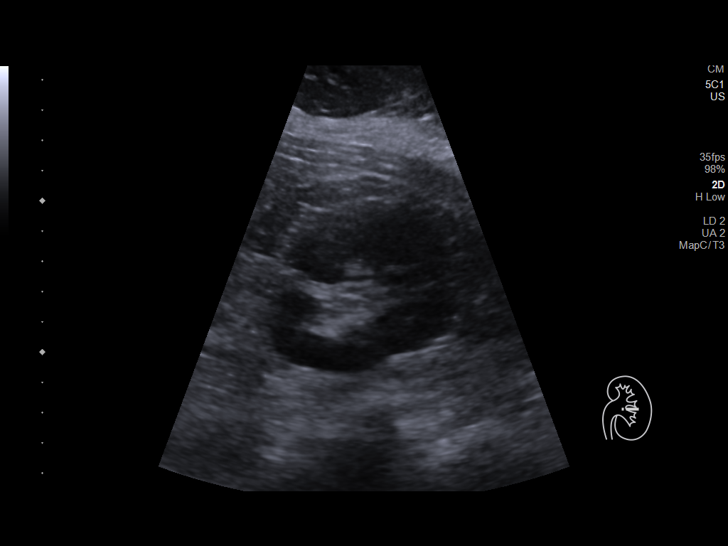
[im 111/111]
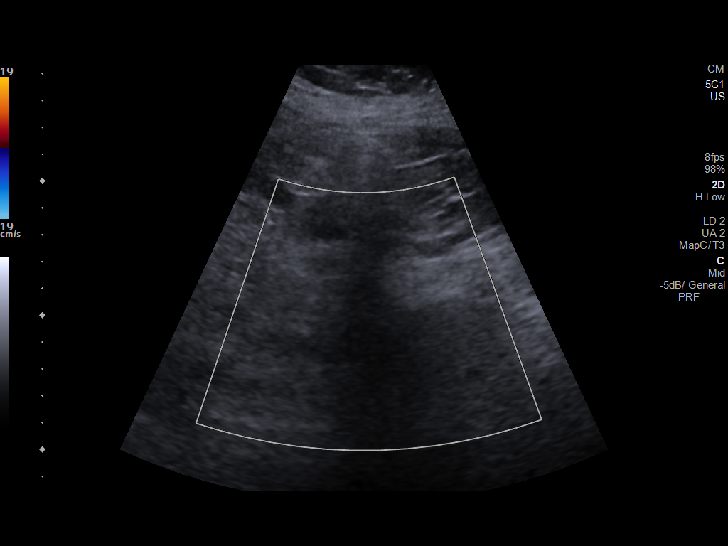

[14 of 25 positions shown; findings below may reference images not displayed]

FINDINGS: Gallbladder: Numerous small gallstones are seen, largest measuring
approximately 1.1 cm. No evidence of gallbladder wall thickening or
pericholecystic fluid. No sonographic Murphy sign noted by
sonographer.

Common bile duct: Diameter: 5 mm, within normal limits.

Liver: Diffusely increased echogenicity of the hepatic parenchyma,
consistent with hepatic steatosis. No hepatic mass identified.
Portal vein is patent on color Doppler imaging with normal direction
of blood flow towards the liver.

IVC: No abnormality visualized.

Pancreas: Visualized portion unremarkable.

Spleen: Size and appearance within normal limits.

Right Kidney: Length: 12.7 cm. Echogenicity within normal limits. No
mass or hydronephrosis visualized.

Left Kidney: Length: 12.3 cm. Echogenicity within normal limits. No
mass or hydronephrosis visualized.

Abdominal aorta: No aneurysm visualized.

Other findings: None.
IMPRESSION: Cholelithiasis. No sonographic signs of cholecystitis or biliary
ductal dilatation.

Diffuse hepatic steatosis.

## 2020-08-30 DIAGNOSIS — E039 Hypothyroidism, unspecified: Secondary | ICD-10-CM | POA: Diagnosis not present

## 2020-08-30 DIAGNOSIS — E119 Type 2 diabetes mellitus without complications: Secondary | ICD-10-CM | POA: Diagnosis not present

## 2020-08-30 DIAGNOSIS — I10 Essential (primary) hypertension: Secondary | ICD-10-CM | POA: Diagnosis not present

## 2020-08-30 DIAGNOSIS — Z1159 Encounter for screening for other viral diseases: Secondary | ICD-10-CM | POA: Diagnosis not present

## 2020-08-30 DIAGNOSIS — F32A Depression, unspecified: Secondary | ICD-10-CM | POA: Diagnosis not present

## 2020-08-30 DIAGNOSIS — E785 Hyperlipidemia, unspecified: Secondary | ICD-10-CM | POA: Diagnosis not present

## 2020-08-30 DIAGNOSIS — Z23 Encounter for immunization: Secondary | ICD-10-CM | POA: Diagnosis not present

## 2020-08-30 DIAGNOSIS — H538 Other visual disturbances: Secondary | ICD-10-CM | POA: Diagnosis not present

## 2020-09-06 ENCOUNTER — Telehealth: Payer: Self-pay | Admitting: General Practice

## 2020-09-06 NOTE — Telephone Encounter (Signed)
Attempted to reach April Carney today to get her scheduled with the [facility] on the Managed  Medicaid team for a phone visit. I left my contact info for her to call me back. I will reach out again in the next 7-14 days if I have not heard back from her.

## 2020-09-19 ENCOUNTER — Inpatient Hospital Stay: Payer: Medicaid Other | Admitting: Neurology

## 2020-09-19 ENCOUNTER — Ambulatory Visit: Payer: Medicaid Other | Admitting: Neurology

## 2020-09-19 ENCOUNTER — Encounter: Payer: Self-pay | Admitting: Neurology

## 2020-09-19 VITALS — BP 130/88 | HR 96 | Ht 65.0 in | Wt 264.4 lb

## 2020-09-19 DIAGNOSIS — I2699 Other pulmonary embolism without acute cor pulmonale: Secondary | ICD-10-CM | POA: Diagnosis not present

## 2020-09-19 DIAGNOSIS — I63511 Cerebral infarction due to unspecified occlusion or stenosis of right middle cerebral artery: Secondary | ICD-10-CM

## 2020-09-19 DIAGNOSIS — I639 Cerebral infarction, unspecified: Secondary | ICD-10-CM | POA: Diagnosis not present

## 2020-09-19 DIAGNOSIS — U071 COVID-19: Secondary | ICD-10-CM

## 2020-09-19 NOTE — Patient Instructions (Signed)
I had a long d/w patient about her recent cryptogenic stroke and COVID-19 illness and pulmonary embolism, risk for recurrent stroke/TIAs, personally independently reviewed imaging studies and stroke evaluation results and answered questions.Continue Eliquis (apixaban) daily  for secondary stroke prevention and maintain strict control of hypertension with blood pressure goal below 130/90, diabetes with hemoglobin A1c goal below 6.5% and lipids with LDL cholesterol goal below 70 mg/dL. I also advised the patient to eat a healthy diet with plenty of whole grains, cereals, fruits and vegetables, exercise regularly and maintain ideal body weight.  I advised her to discuss with her gynecologist possibility of discontinuing in the estrogen progesterone implant that she has is that may have contributed to her hypercoagulability and stroke.  Followup in the future with my nurse practitioner Shanda Bumps in 6 months or call earlier if necessary.  Stroke Prevention Some medical conditions and behaviors are associated with a higher chance of having a stroke. You can help prevent a stroke by making nutrition, lifestyle, and other changes, including managing any medical conditions you may have. What nutrition changes can be made?  Eat healthy foods. You can do this by: ? Choosing foods high in fiber, such as fresh fruits and vegetables and whole grains. ? Eating at least 5 or more servings of fruits and vegetables a day. Try to fill half of your plate at each meal with fruits and vegetables. ? Choosing lean protein foods, such as lean cuts of meat, poultry without skin, fish, tofu, beans, and nuts. ? Eating low-fat dairy products. ? Avoiding foods that are high in salt (sodium). This can help lower blood pressure. ? Avoiding foods that have saturated fat, trans fat, and cholesterol. This can help prevent high cholesterol. ? Avoiding processed and premade foods.  Follow your health care provider's specific guidelines for  losing weight, controlling high blood pressure (hypertension), lowering high cholesterol, and managing diabetes. These may include: ? Reducing your daily calorie intake. ? Limiting your daily sodium intake to 1,500 milligrams (mg). ? Using only healthy fats for cooking, such as olive oil, canola oil, or sunflower oil. ? Counting your daily carbohydrate intake.   What lifestyle changes can be made?  Maintain a healthy weight. Talk to your health care provider about your ideal weight.  Get at least 30 minutes of moderate physical activity at least 5 days a week. Moderate activity includes brisk walking, biking, and swimming.  Do not use any products that contain nicotine or tobacco, such as cigarettes and e-cigarettes. If you need help quitting, ask your health care provider. It may also be helpful to avoid exposure to secondhand smoke.  Limit alcohol intake to no more than 1 drink a day for nonpregnant women and 2 drinks a day for men. One drink equals 12 oz of beer, 5 oz of wine, or 1 oz of hard liquor.  Stop any illegal drug use.  Avoid taking birth control pills. Talk to your health care provider about the risks of taking birth control pills if: ? You are over 62 years old. ? You smoke. ? You get migraines. ? You have ever had a blood clot. What other changes can be made?  Manage your cholesterol levels. ? Eating a healthy diet is important for preventing high cholesterol. If cholesterol cannot be managed through diet alone, you may also need to take medicines. ? Take any prescribed medicines to control your cholesterol as told by your health care provider.  Manage your diabetes. ? Eating a healthy diet  and exercising regularly are important parts of managing your blood sugar. If your blood sugar cannot be managed through diet and exercise, you may need to take medicines. ? Take any prescribed medicines to control your diabetes as told by your health care provider.  Control your  hypertension. ? To reduce your risk of stroke, try to keep your blood pressure below 130/80. ? Eating a healthy diet and exercising regularly are an important part of controlling your blood pressure. If your blood pressure cannot be managed through diet and exercise, you may need to take medicines. ? Take any prescribed medicines to control hypertension as told by your health care provider. ? Ask your health care provider if you should monitor your blood pressure at home. ? Have your blood pressure checked every year, even if your blood pressure is normal. Blood pressure increases with age and some medical conditions.  Get evaluated for sleep disorders (sleep apnea). Talk to your health care provider about getting a sleep evaluation if you snore a lot or have excessive sleepiness.  Take over-the-counter and prescription medicines only as told by your health care provider. Aspirin or blood thinners (antiplatelets or anticoagulants) may be recommended to reduce your risk of forming blood clots that can lead to stroke.  Make sure that any other medical conditions you have, such as atrial fibrillation or atherosclerosis, are managed. What are the warning signs of a stroke? The warning signs of a stroke can be easily remembered as BEFAST.  B is for balance. Signs include: ? Dizziness. ? Loss of balance or coordination. ? Sudden trouble walking.  E is for eyes. Signs include: ? A sudden change in vision. ? Trouble seeing.  F is for face. Signs include: ? Sudden weakness or numbness of the face. ? The face or eyelid drooping to one side.  A is for arms. Signs include: ? Sudden weakness or numbness of the arm, usually on one side of the body.  S is for speech. Signs include: ? Trouble speaking (aphasia). ? Trouble understanding.  T is for time. ? These symptoms may represent a serious problem that is an emergency. Do not wait to see if the symptoms will go away. Get medical help right  away. Call your local emergency services (911 in the [place] Do not drive yourself to the hospital.  Other signs of stroke may include: ? A sudden, severe headache with no known cause. ? Nausea or vomiting. ? Seizure. Where to find more information For more information, visit:  American Stroke Association: www.strokeassociation.org  National Stroke Association: www.stroke.org Summary  You can prevent a stroke by eating healthy, exercising, not smoking, limiting alcohol intake, and managing any medical conditions you may have.  Do not use any products that contain nicotine or tobacco, such as cigarettes and e-cigarettes. If you need help quitting, ask your health care provider. It may also be helpful to avoid exposure to secondhand smoke.  Remember BEFAST for warning signs of stroke. Get help right away if you or a loved one has any of these signs. This information is not intended to replace advice given to you by your health care provider. Make sure you discuss any questions you have with your health care provider. Document Revised: 06/26/2017 Document Reviewed: 08/19/2016 Elsevier Patient Education  2021 [employer].

## 2020-09-19 NOTE — Progress Notes (Signed)
[facility] [address] [place]. [place] [address] [phone]       OFFICE CONSULT NOTE  Ms. JANI MORONTA Date of Birth:  1980/10/25 Medical Record Number:  [id number]   Referring MD: Rosalin Hawking Reason for Referral: Stroke  HPI: April Carney is a 40 year old pleasant Caucasian lady seen today for initial office consultation visit for stroke.  History is obtained from the patient and review of electronic medical records and personally reviewed pertinent imaging films in PACS.  She has past medical history of diabetes, hypertension, hyperlipidemia Ko[place]el obesity and depression.  She presented on 07/09/2020 to [facility] for sudden onset of left-sided numbness and weakness.  She was seen on telemetry neurology consultation by Dr. Rory Percy and found to have mild left hand weakness and sensory loss and after discussion of risk benefit she was treated with IV TPA.  She had a history of recent Covid infection on 06/27/2020 and she had been not vaccinated at that time.  She was transferred to [facility] where she was hospitalized.  CT angiogram of brain and neck showed no significant large vessel stenosis or occlusion.  MRI scan showed patchy right MCA infarct in the posterior division.  2D echo showed ejection fraction of 55 to 60% without cardiac source of embolism.  Lower extremity venous Dopplers were negative for DVT.  TCD bubble study was negative for PFO.  LDL cholesterol was 69 mg percent.  Hemoglobin A1c was 8.6.  Urine drug screen was negative.  HIV was negative.  RPR was negative.  Hypercoagulable panel labs were all negative.  ANA panel was negative.  Patient was initially started on IV heparin as she was found to have bilateral pulmonary emboli on CT angiogram of the chest.  She subsequently switched to Eliquis.  She states she is done well she is made full recovery her left-sided numbness as well as left hand weakness have completely recovered.  She has  no residual deficits.  Patient was also recent estrogen progesterone implant prior to her stroke and she was asked to discuss this with her gynecologist but she feels because of heavy menstrual cycles she will stay on it as long as she is on the Eliquis.  She states she is done a much better job in controlling her sugars and last A1c has come down to below 7.  Blood pressure is also well controlled today it is 130/68 and she is on lisinopril.  She is tolerating Lipitor well without muscle aches and pains.  She has no complaints today. ROS:   14 system review of systems is positive for numbness, speech difficulties and all other systems negative PMH:  Past Medical History:  Diagnosis Date  . Depression   . Diabetes mellitus without complication ([place])   . Hyperlipidemia   . Hypertension   . Hypoactive thyroid   . Hypothyroid   . Mental disorder    depression and PTSD  . Stroke [facility]) 07/09/2020    Social History:  Social History   Socioeconomic History  . Marital status: Significant Other    Spouse name: Not on file  . Number of children: Not on file  . Years of education: Not on file  . Highest education level: Not on file  Occupational History  . Not on file  Tobacco Use  . Smoking status: Never Smoker  . Smokeless tobacco: Never Used  Vaping Use  . Vaping Use: Never used  Substance and Sexual Activity  . Alcohol use:  Yes    Comment: "very rarely"  . Drug use: No  . Sexual activity: Not Currently    Partners: Male    Birth control/protection: None  Other Topics Concern  . Not on file  Social History Narrative   Lives alone with 2 kids   Right Handed   Drinks4-5 cups caffeine daily   Social Determinants of Health   Financial Resource Strain: Unknown  . Difficulty of Paying Living Expenses: Patient refused  Food Insecurity: No Food Insecurity  . Worried About Charity fundraiser in the Last Year: Never true  . Ran Out of Food in the Last Year: Never true   Transportation Needs: No Transportation Needs  . Lack of Transportation (Medical): No  . Lack of Transportation (Non-Medical): No  Physical Activity: Inactive  . Days of Exercise per Week: 0 days  . Minutes of Exercise per Session: 0 min  Stress: Stress Concern Present  . Feeling of Stress : To some extent  Social Connections: Socially Isolated  . Frequency of Communication with Friends and Family: Once a week  . Frequency of Social Gatherings with Friends and Family: Never  . Attends Religious Services: 1 to 4 times per year  . Active Member of Clubs or Organizations: No  . Attends Archivist Meetings: Never  . Marital Status: Never married  Intimate Partner Violence: Not At Risk  . Fear of Current or Ex-Partner: No  . Emotionally Abused: No  . Physically Abused: No  . Sexually Abused: No    Medications:   Current Outpatient Medications on File Prior to Visit  Medication Sig Dispense Refill  . Accu-Chek FastClix Lancets MISC Use to check blood glucose twice a day 60 each 0  . acetaminophen (TYLENOL) 325 MG tablet Take 650 mg by mouth every 6 (six) hours as needed.     Marland Kitchen apixaban (ELIQUIS) 5 MG TABS tablet Take 1 tablet (5 mg total) by mouth 2 (two) times daily. 60 tablet 0  . atorvastatin (LIPITOR) 20 MG tablet Take 1 tablet (20 mg total) by mouth daily. 90 tablet 3  . blood glucose meter kit and supplies KIT Dispense based on patient and insurance preference. Use up to four times daily as directed. (FOR ICD-9 250.00, 250.01). 1 each 0  . ferrous sulfate 325 (65 FE) MG tablet Take 1 tablet (325 mg total) by mouth 2 (two) times daily with a meal. 60 tablet 0  . FLUoxetine (PROZAC) 20 MG capsule Take three pills each morning as directed. (Patient taking differently: Take 60 mg by mouth daily. Take three pills each morning as directed.) 90 capsule 3  . glucose blood (ACCU-CHEK GUIDE) test strip Use to check glucose twice a day 60 each 0  . levothyroxine (SYNTHROID) 150 MCG  tablet Take 1 tablet (150 mcg total) by mouth daily. 30 tablet 11  . lisinopril (ZESTRIL) 5 MG tablet Take 5 mg by mouth daily.    . metFORMIN (GLUCOPHAGE) 500 MG tablet TAKE 1 TABLET BY MOUTH WITH BREAKFAST AND 2 TABLETS WITH EVENING MEAL 90 tablet 0  . sitaGLIPtin (JANUVIA) 50 MG tablet Take 50 mg by mouth daily.    Marland Kitchen apixaban (ELIQUIS) 5 MG TABS tablet Take 2 tablets (10 mg total) by mouth 2 (two) times daily for 5 days. 20 tablet 0  . hydrochlorothiazide (MICROZIDE) 12.5 MG capsule Take 1 capsule (12.5 mg total) by mouth daily. 30 capsule 0   No current facility-administered medications on file prior to visit.  Allergies:  No Known Allergies  Physical Exam General: Obese young Caucasian lady, seated, in no evident distress Head: head normocephalic and atraumatic.   Neck: supple with no carotid or supraclavicular bruits Cardiovascular: regular rate and rhythm, no murmurs Musculoskeletal: no deformity Skin:  no rash/petichiae Vascular:  Normal pulses all extremities  Neurologic Exam Mental Status: Awake and fully alert. Oriented to place and time. Recent and remote memory intact. Attention span, concentration and fund of knowledge appropriate. Mood and affect appropriate.  Cranial Nerves: Fundoscopic exam reveals sharp disc margins. Pupils equal, briskly reactive to light. Extraocular movements full without nystagmus. Visual fields full to confrontation. Hearing intact. Facial sensation intact. Face, tongue, palate moves normally and symmetrically.  Motor: Normal bulk and tone. Normal strength in all tested extremity muscles. Sensory.: intact to touch , pinprick , position and vibratory sensation.  Coordination: Rapid alternating movements normal in all extremities. Finger-to-nose and heel-to-shin performed accurately bilaterally. Gait and Station: Arises from chair without difficulty. Stance is normal. Gait demonstrates normal stride length and balance . Able to heel, toe and tandem  walk without difficulty.  Reflexes: 1+ and symmetric. Toes downgoing.   NIHSS 0 Modified Rankin  0   ASSESSMENT: 40 year old Caucasian lady with right MCA branch infarct of cryptogenic etiology in the setting of acute COVID illness related hypercoagulability and being on birth control pills.  Vascular risk factors of diabetes, hypertension, hyperlipidemia, obesity, COVID related hypercoagulability and birth control implant.     PLAN: I had a long d/w patient about her recent cryptogenic stroke and COVID-19 illness and pulmonary embolism, risk for recurrent stroke/TIAs, personally independently reviewed imaging studies and stroke evaluation results and answered questions.Continue Eliquis (apixaban) daily  for secondary stroke prevention and maintain strict control of hypertension with blood pressure goal below 130/90, diabetes with hemoglobin A1c goal below 6.5% and lipids with LDL cholesterol goal below 70 mg/dL. I also advised the patient to eat a healthy diet with plenty of whole grains, cereals, fruits and vegetables, exercise regularly and maintain ideal body weight.  I advised her to discuss with her gynecologist possibility of discontinuing in the estrogen progesterone implant that she has is that may have contributed to her hypercoagulability and stroke.  Followup in the future with my nurse practitioner Janett Billow in 6 months or call earlier if necessary.  Greater than 50% time during this 45-minute consultation visit was spent on counseling and coordination of care about her recent cryptogenic stroke in the setting of acute COVID illness and hypercoagulability and answering questions Antony Contras, MD Medical Director [facility] Pager: [phone] 09/19/2020 8:49 AM  Note: This document was prepared with digital dictation and possible smart phrase technology. Any transcriptional errors that result from this process are unintentional.

## 2020-10-10 DIAGNOSIS — K5900 Constipation, unspecified: Secondary | ICD-10-CM | POA: Diagnosis not present

## 2020-10-10 DIAGNOSIS — I10 Essential (primary) hypertension: Secondary | ICD-10-CM | POA: Diagnosis not present

## 2020-10-18 ENCOUNTER — Other Ambulatory Visit: Payer: Self-pay

## 2020-10-18 ENCOUNTER — Inpatient Hospital Stay: Payer: Medicaid Other | Admitting: Neurology

## 2020-10-18 ENCOUNTER — Telehealth: Payer: Self-pay | Admitting: Family Medicine

## 2020-10-18 NOTE — Patient Outreach (Signed)
[facility]) Care Management  10/18/2020  Rashia JAZZELLE ZHANG 1981-03-16 [id number]   First telephone outreach attempt to obtain mRS. No answer. Left message for returned call.  Vanice Sarah [facility] Management Assistant [phone]

## 2020-10-18 NOTE — Telephone Encounter (Signed)
I left a 2nd message for [name] today. Attempting to get her scheduled with the Managed Medicaid Team for a phone visit. I will reach out again in the next 7-14 days if I have not heard back from her.

## 2020-10-23 ENCOUNTER — Other Ambulatory Visit: Payer: Self-pay

## 2020-10-23 NOTE — Patient Outreach (Signed)
[facility]) Care Management  10/23/2020  April Carney 1981/05/05 [id number]   Telephone outreach to patient to obtain mRS was successfully completed. MRS= 0  Thank you, Vanice Sarah [facility] Care Management Assistant

## 2020-12-12 DIAGNOSIS — D649 Anemia, unspecified: Secondary | ICD-10-CM | POA: Diagnosis not present

## 2020-12-12 DIAGNOSIS — Z1159 Encounter for screening for other viral diseases: Secondary | ICD-10-CM | POA: Diagnosis not present

## 2020-12-12 DIAGNOSIS — Z13220 Encounter for screening for lipoid disorders: Secondary | ICD-10-CM | POA: Diagnosis not present

## 2020-12-12 DIAGNOSIS — E039 Hypothyroidism, unspecified: Secondary | ICD-10-CM | POA: Diagnosis not present

## 2020-12-12 DIAGNOSIS — Z136 Encounter for screening for cardiovascular disorders: Secondary | ICD-10-CM | POA: Diagnosis not present

## 2020-12-12 DIAGNOSIS — E119 Type 2 diabetes mellitus without complications: Secondary | ICD-10-CM | POA: Diagnosis not present

## 2020-12-12 DIAGNOSIS — E785 Hyperlipidemia, unspecified: Secondary | ICD-10-CM | POA: Diagnosis not present

## 2021-03-21 ENCOUNTER — Encounter: Payer: Self-pay | Admitting: Adult Health

## 2021-03-21 ENCOUNTER — Other Ambulatory Visit: Payer: Self-pay

## 2021-03-21 ENCOUNTER — Ambulatory Visit: Payer: Medicaid Other | Admitting: Adult Health

## 2021-03-21 VITALS — BP 130/92 | HR 81 | Ht 64.0 in | Wt 276.0 lb

## 2021-03-21 DIAGNOSIS — I63511 Cerebral infarction due to unspecified occlusion or stenosis of right middle cerebral artery: Secondary | ICD-10-CM | POA: Diagnosis not present

## 2021-03-21 DIAGNOSIS — I2699 Other pulmonary embolism without acute cor pulmonale: Secondary | ICD-10-CM

## 2021-03-21 NOTE — Progress Notes (Signed)
[facility] [address] [place]. [place] [address] [phone]       OFFICE FOLLOW UP NOTE  April Carney Date of Birth:  03/16/1981 Medical Record Number:  [id number]   Referring MD: Rosalin Hawking Reason for Referral: Stroke   Chief Complaint  Patient presents with   Follow-up    RM 3 alone Pt is well and stable. No new complications       HPI:   Today, 03/21/2021, April Carney returns for 38-month stroke follow-up.  Overall doing well.  Denies new or reoccurring stroke/TIA symptoms.  Remains on Eliquis 5 mg twice daily and atorvastatin 20 mg daily without side effects.  Blood pressure today 130/92.  Most recent A1c 6.1 (11/2020).  She continues to use Nexplanon implant to help control heavy menstrual cycles. No concerns at this time.   History provided for reference purposes only Initial visit 09/19/2020 Dr. Leonie Man: April Carney is a 40 year old pleasant Caucasian lady seen today for initial office consultation visit for stroke.  History is obtained from the patient and review of electronic medical records and personally reviewed pertinent imaging films in PACS.  She has past medical history of diabetes, hypertension, hyperlipidemia [place] obesity and depression.  She presented on 07/09/2020 to [facility] for sudden onset of left-sided numbness and weakness.  She was seen on telemetry neurology consultation by Dr. Rory Percy and found to have mild left hand weakness and sensory loss and after discussion of risk benefit she was treated with IV TPA.  She had a history of recent Covid infection on 06/27/2020 and she had been not vaccinated at that time.  She was transferred to [facility] where she was hospitalized.  CT angiogram of brain and neck showed no significant large vessel stenosis or occlusion.  MRI scan showed patchy right MCA infarct in the posterior division.  2D echo showed ejection fraction of 55 to 60% without cardiac source of embolism.   Lower extremity venous Dopplers were negative for DVT.  TCD bubble study was negative for PFO.  LDL cholesterol was 69 mg percent.  Hemoglobin A1c was 8.6.  Urine drug screen was negative.  HIV was negative.  RPR was negative.  Hypercoagulable panel labs were all negative.  ANA panel was negative.  Patient was initially started on IV heparin as she was found to have bilateral pulmonary emboli on CT angiogram of the chest.  She subsequently switched to Eliquis.  She states she is done well she is made full recovery her left-sided numbness as well as left hand weakness have completely recovered.  She has no residual deficits.  Patient was also recent estrogen progesterone implant prior to her stroke and she was asked to discuss this with her gynecologist but she feels because of heavy menstrual cycles she will stay on it as long as she is on the Eliquis.  She states she is done a much better job in controlling her sugars and last A1c has come down to below 7.  Blood pressure is also well controlled today it is 130/68 and she is on lisinopril.  She is tolerating Lipitor well without muscle aches and pains.  She has no complaints today.     ROS:   14 system review of systems is positive for no complaints and all other systems negative PMH:  Past Medical History:  Diagnosis Date   Depression    Diabetes mellitus without complication ([place])    Hyperlipidemia    Hypertension  Hypoactive thyroid    Hypothyroid    Mental disorder    depression and PTSD   Stroke ([place]) 07/09/2020    Social History:  Social History   Socioeconomic History   Marital status: Significant Other    Spouse name: Not on file   Number of children: Not on file   Years of education: Not on file   Highest education level: Not on file  Occupational History   Not on file  Tobacco Use   Smoking status: Never   Smokeless tobacco: Never  Vaping Use   Vaping Use: Never used  Substance and Sexual Activity   Alcohol use: Yes     Comment: "very rarely"   Drug use: No   Sexual activity: Not Currently    Partners: Male    Birth control/protection: None  Other Topics Concern   Not on file  Social History Narrative   Lives alone with 2 kids   Right Handed   Drinks4-5 cups caffeine daily   Social Determinants of Health   Financial Resource Strain: Unknown   Difficulty of Paying Living Expenses: Patient refused  Food Insecurity: No Food Insecurity   Worried About Running Out of Food in the Last Year: Never true   Ran Out of Food in the Last Year: Never true  Transportation Needs: No Transportation Needs   Lack of Transportation (Medical): No   Lack of Transportation (Non-Medical): No  Physical Activity: Inactive   Days of Exercise per Week: 0 days   Minutes of Exercise per Session: 0 min  Stress: Stress Concern Present   Feeling of Stress : To some extent  Social Connections: Socially Isolated   Frequency of Communication with Friends and Family: Once a week   Frequency of Social Gatherings with Friends and Family: Never   Attends Religious Services: 1 to 4 times per year   Active Member of [employer] or Organizations: No   Attends Music therapist: Never   Marital Status: Never married  Human resources officer Violence: Not At Risk   Fear of Current or Ex-Partner: No   Emotionally Abused: No   Physically Abused: No   Sexually Abused: No    Medications:   Current Outpatient Medications on File Prior to Visit  Medication Sig Dispense Refill   Accu-Chek FastClix Lancets MISC Use to check blood glucose twice a day 60 each 0   acetaminophen (TYLENOL) 325 MG tablet Take 650 mg by mouth every 6 (six) hours as needed.      apixaban (ELIQUIS) 5 MG TABS tablet Take 2 tablets (10 mg total) by mouth 2 (two) times daily for 5 days. 20 tablet 0   apixaban (ELIQUIS) 5 MG TABS tablet TAKE 2 TABLETS (10 MG TOTAL) BY MOUTH 2 (TWO) TIMES DAILY FOR 5 DAYS, THEN DECREASE TO 1 TABLET ($RemoveB'5MG'sYFzCfOL$ ) TWICE DAILY 74 tablet 0    atorvastatin (LIPITOR) 20 MG tablet Take 1 tablet (20 mg total) by mouth daily. 90 tablet 3   blood glucose meter kit and supplies KIT Dispense based on patient and insurance preference. Use up to four times daily as directed. (FOR ICD-9 250.00, 250.01). 1 each 0   ferrous sulfate 325 (65 FE) MG tablet TAKE 1 TABLET (325 MG TOTAL) BY MOUTH 2 (TWO) TIMES DAILY WITH A MEAL. 60 tablet 0   FLUoxetine (PROZAC) 20 MG capsule Take three pills each morning as directed. (Patient taking differently: Take 60 mg by mouth daily. Take three pills each morning as directed.) 90 capsule 3  glucose blood (ACCU-CHEK GUIDE) test strip Use to check glucose twice a day 60 each 0   hydrochlorothiazide (MICROZIDE) 12.5 MG capsule Take 1 capsule (12.5 mg total) by mouth daily. 30 capsule 0   levothyroxine (SYNTHROID) 150 MCG tablet Take 1 tablet (150 mcg total) by mouth daily. 30 tablet 11   lisinopril (ZESTRIL) 5 MG tablet Take 5 mg by mouth daily.     metFORMIN (GLUCOPHAGE) 500 MG tablet TAKE 1 TABLET BY MOUTH WITH BREAKFAST AND 2 TABLETS WITH EVENING MEAL 90 tablet 0   sitaGLIPtin (JANUVIA) 50 MG tablet Take 50 mg by mouth daily.     No current facility-administered medications on file prior to visit.    Allergies:  No Known Allergies  Physical Exam Today's Vitals   03/21/21 0827  BP: (!) 130/92  Pulse: 81  Weight: 276 lb (125.2 kg)  Height: 5\' 4"  (1.626 m)   Body mass index is 47.38 kg/m.   General: Obese very pleasant middle-aged Caucasian lady, seated, in no evident distress Head: head normocephalic and atraumatic.   Neck: supple with no carotid or supraclavicular bruits Cardiovascular: regular rate and rhythm, no murmurs Musculoskeletal: no deformity Skin:  no rash/petichiae Vascular:  Normal pulses all extremities  Neurologic Exam Mental Status: Awake and fully alert.  Fluent speech and language.  Oriented to place and time. Recent and remote memory intact. Attention span, concentration and fund  of knowledge appropriate. Mood and affect appropriate.  Cranial Nerves: Pupils equal, briskly reactive to light. Extraocular movements full without nystagmus. Visual fields full to confrontation. Hearing intact. Facial sensation intact. Face, tongue, palate moves normally and symmetrically.  Motor: Normal bulk and tone. Normal strength in all tested extremity muscles. Sensory.: intact to touch , pinprick , position and vibratory sensation.  Coordination: Rapid alternating movements normal in all extremities. Finger-to-nose and heel-to-shin performed accurately bilaterally. Gait and Station: Arises from chair without difficulty. Stance is normal. Gait demonstrates normal stride length and balance . Able to heel, toe and tandem walk without difficulty.  Reflexes: 1+ and symmetric. Toes downgoing.      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  Also found to have bilateral PE and started on Eliquis.  Vascular risk factors of diabetes, hypertension, hyperlipidemia, obesity, COVID related hypercoagulability and birth control implant.    1.  Right MCA stroke -Recovered without residual deficit. -Continue Eliquis (apixaban) daily and atorvastatin 20 mg daily for secondary stroke prevention  -if eliquis d/c'd (see below) will advise to start aspirin 81mg  daily -HTN: BP goal <130/90. stable -HLD: LDL goal <70. Continue atorvastatin 20mg  daily -DM: A1c goal<7. stable   2.  Bilateral PE -will discuss stopping eliquis with Dr. Leonie Man as she has been on [facility] for 8 months as PE was likely provoked event in setting of COVID hypercoagulability     Follow up in 6 months or call earlier if needed    CC:  [place] provider: Dr. Kurtis Bushman, Tyler Deis, MD   I spent 31 minutes of face-to-face and non-face-to-face time with patient.  This  included previsit chart review, lab review, study review, order entry, electronic health record documentation, patient education and discussion regarding prior stroke, secondary stroke prevention measures, aggressive stroke risk factor management and answered all other questions to patients satisfaction  Frann Rider, [facility]  [facility] [address] [place], [place] [address]  Phone [phone] Fax [phone] Note: This document was prepared with digital dictation and possible smart phrase technology. Any transcriptional errors that result from this process are unintentional.

## 2021-03-21 NOTE — Patient Instructions (Signed)
Continue Eliquis (apixaban) daily  and atorvastatin 20 mg daily for secondary stroke prevention  I will keep you updated via MyChart if Eliquis can be stopped - if so, we will switch you to aspirin 81mg  daily for secondary stroke prevention measures  Continue to follow up with PCP regarding cholesterol, blood pressure and diabetes management  Maintain strict control of hypertension with blood pressure goal below 130/90, diabetes with hemoglobin A1c goal below 7% and cholesterol with LDL cholesterol (bad cholesterol) goal below 70 mg/dL.       Followup in the future with me in 6 months or call earlier if needed       Thank you for coming to see at [facility]. I hope we have been able to provide you high quality care today.  You may receive a patient satisfaction survey over the next few weeks. We would appreciate your feedback and comments so that we may continue to improve ourselves and the health of our patients.

## 2021-03-22 NOTE — Progress Notes (Signed)
I agree with the above plan 

## 2021-03-29 ENCOUNTER — Encounter: Payer: Self-pay | Admitting: Adult Health

## 2021-04-02 NOTE — Telephone Encounter (Signed)
Can patient stop eliquis for prior b/l PE?

## 2021-04-15 NOTE — Telephone Encounter (Signed)
Per Dr. Pearlean Brownie, please reach out to PCP to discuss ongoing use of Eliquis or if this can now be discontinued. Thank you

## 2021-06-27 DIAGNOSIS — Z23 Encounter for immunization: Secondary | ICD-10-CM | POA: Diagnosis not present

## 2021-06-27 DIAGNOSIS — E119 Type 2 diabetes mellitus without complications: Secondary | ICD-10-CM | POA: Diagnosis not present

## 2021-06-27 DIAGNOSIS — Z1159 Encounter for screening for other viral diseases: Secondary | ICD-10-CM | POA: Diagnosis not present

## 2021-06-27 DIAGNOSIS — Z6841 Body Mass Index (BMI) 40.0 and over, adult: Secondary | ICD-10-CM | POA: Diagnosis not present

## 2021-06-27 DIAGNOSIS — E785 Hyperlipidemia, unspecified: Secondary | ICD-10-CM | POA: Diagnosis not present

## 2021-06-27 DIAGNOSIS — E039 Hypothyroidism, unspecified: Secondary | ICD-10-CM | POA: Diagnosis not present

## 2021-07-05 DIAGNOSIS — Z79899 Other long term (current) drug therapy: Secondary | ICD-10-CM | POA: Diagnosis not present

## 2021-07-05 DIAGNOSIS — E119 Type 2 diabetes mellitus without complications: Secondary | ICD-10-CM | POA: Insufficient documentation

## 2021-07-05 DIAGNOSIS — Z7984 Long term (current) use of oral hypoglycemic drugs: Secondary | ICD-10-CM | POA: Diagnosis not present

## 2021-07-05 DIAGNOSIS — I10 Essential (primary) hypertension: Secondary | ICD-10-CM | POA: Diagnosis not present

## 2021-07-05 DIAGNOSIS — T161XXA Foreign body in right ear, initial encounter: Secondary | ICD-10-CM | POA: Diagnosis not present

## 2021-07-05 DIAGNOSIS — E039 Hypothyroidism, unspecified: Secondary | ICD-10-CM | POA: Insufficient documentation

## 2021-07-05 DIAGNOSIS — Z7901 Long term (current) use of anticoagulants: Secondary | ICD-10-CM | POA: Diagnosis not present

## 2021-07-05 DIAGNOSIS — H9201 Otalgia, right ear: Secondary | ICD-10-CM | POA: Diagnosis present

## 2021-07-05 DIAGNOSIS — X58XXXA Exposure to other specified factors, initial encounter: Secondary | ICD-10-CM | POA: Diagnosis not present

## 2021-07-05 DIAGNOSIS — Z8616 Personal history of COVID-19: Secondary | ICD-10-CM | POA: Diagnosis not present

## 2021-07-06 ENCOUNTER — Other Ambulatory Visit: Payer: Self-pay

## 2021-07-06 ENCOUNTER — Encounter (HOSPITAL_COMMUNITY): Payer: Self-pay

## 2021-07-06 ENCOUNTER — Emergency Department (HOSPITAL_COMMUNITY)
Admission: EM | Admit: 2021-07-06 | Discharge: 2021-07-06 | Disposition: A | Discharge status: Home / Self Care | Payer: Medicaid Other | Attending: Emergency Medicine | Admitting: Emergency Medicine

## 2021-07-06 DIAGNOSIS — T161XXA Foreign body in right ear, initial encounter: Secondary | ICD-10-CM

## 2021-07-06 MED ORDER — CIPROFLOXACIN-DEXAMETHASONE 0.3-0.1 % OT SUSP
4.0000 [drp] | Freq: Two times a day (BID) | OTIC | Status: DC
  Administered 2021-07-06: 4 [drp] via OTIC
  Filled 2021-07-06: qty 7.5

## 2021-07-06 NOTE — ED Provider Notes (Signed)
[facility] EMERGENCY DEPARTMENT Provider Note   CSN: [id number] Arrival date & time: 07/05/21  2314     History Chief Complaint  Patient presents with   Otalgia    April Carney is a 40 y.o. female.  Patient presents to the emergency department for evaluation of right ear pain.  Patient reports that she was visiting her sister's home when she started to hear a noise in her right ear.  Patient describes it as a "fluttering".  She thinks there is something in her ear.  She tried to take it out and flush it at home without improvement.      Past Medical History:  Diagnosis Date   Depression    Diabetes mellitus without complication ([place])    Hyperlipidemia    Hypertension    Hypoactive thyroid    Hypothyroid    Mental disorder    depression and PTSD   Stroke ([place]) 07/09/2020    Patient Active Problem List   Diagnosis Date Noted   COVID-19    Stroke (cerebrum) ([place]) 07/09/2020   CVA (cerebral vascular accident) ([place]) 07/09/2020   Nexplanon insertion 05/25/2020   Anemia 08/22/2019   Elevated liver function tests 06/02/2017   Superficial fungus infection of skin 07/01/2016   Essential hypertension 07/01/2016   Diabetes mellitus ([place]) 12/12/2015   Hyperlipidemia 12/12/2015   Situational depression 12/11/2015   Family h/o Biliary atresia 04/24/2015   History of preterm delivery 04/24/2015   History of cesarean section 04/24/2015   Obesity 04/24/2015   Hypothyroidism 04/24/2015    Past Surgical History:  Procedure Laterality Date   CESAREAN SECTION       OB History     Gravida  2   Para  2   Term  1   Preterm  1   AB      Living  2      SAB      IAB      Ectopic      Multiple      Live Births  2           Family History  Problem Relation Age of Onset   Diabetes Mother    Hypertension Mother    Hyperlipidemia Mother    Kidney disease Mother    Stroke Mother    Diabetes Father    Heart disease Father    Liver disease Son         biliary atresia    Asthma Son    ADD / ADHD Son    ODD Son    Asthma Son    ADD / ADHD Son    ODD Son    Cancer Paternal Aunt    Colon cancer Neg Hx     Social History   Tobacco Use   Smoking status: Never   Smokeless tobacco: Never  Vaping Use   Vaping Use: Never used  Substance Use Topics   Alcohol use: Yes    Comment: "very rarely"   Drug use: No    Home Medications Prior to Admission medications   Medication Sig Start Date End Date Taking? Authorizing Provider  Accu-Chek FastClix Lancets MISC Use to check blood glucose twice a day 01/02/20   Maryruth Hancock, MD  acetaminophen (TYLENOL) 325 MG tablet Take 650 mg by mouth every 6 (six) hours as needed.     [provider]  apixaban (ELIQUIS) 5 MG TABS tablet TAKE 2 TABLETS (10 MG TOTAL) BY MOUTH 2 (TWO) TIMES  DAILY FOR 5 DAYS, THEN DECREASE TO 1 TABLET ($RemoveB'5MG'xvusjZlM$ ) TWICE DAILY 07/16/20 07/16/21  Thurnell Lose, MD  atorvastatin (LIPITOR) 20 MG tablet Take 1 tablet (20 mg total) by mouth daily. 05/01/17   [name], MD  blood glucose meter kit and supplies KIT Dispense based on patient and insurance preference. Use up to four times daily as directed. (FOR ICD-9 250.00, 250.01). 05/31/19   Corum, Rex Kras, MD  Dulaglutide 0.75 MG/0.5ML SOPN Inject 0.75 mg into the skin once a week.    [provider]  etonogestrel (NEXPLANON) 68 MG IMPL implant 1 each by Subdermal route once.    [provider]  ferrous sulfate 325 (65 FE) MG tablet TAKE 1 TABLET (325 MG TOTAL) BY MOUTH 2 (TWO) TIMES DAILY WITH A MEAL. 07/16/20 07/16/21  Thurnell Lose, MD  FLUoxetine (PROZAC) 20 MG capsule Take three pills each morning as directed. Patient taking differently: Take 20 mg by mouth daily. 04/14/18   [name], MD  glucose blood (ACCU-CHEK GUIDE) test strip Use to check glucose twice a day 01/02/20   Maryruth Hancock, MD  levothyroxine (SYNTHROID) 175 MCG tablet Take 175 mcg by mouth daily.    [provider]   lisinopril (ZESTRIL) 10 MG tablet Take 10 mg by mouth daily.    [provider]  metFORMIN (GLUCOPHAGE) 500 MG tablet TAKE 1 TABLET BY MOUTH WITH BREAKFAST AND 2 TABLETS WITH EVENING MEAL Patient taking differently: 2 (two) times daily. TAKE 1 TABLET BY MOUTH WITH BREAKFAST AND 2 TABLETS WITH EVENING MEAL 01/02/20   Corum, Rex Kras, MD  sitaGLIPtin (JANUVIA) 50 MG tablet Take 50 mg by mouth daily. 06/08/20 06/08/21  [provider]    Allergies    Patient has no known allergies.  Review of Systems   Review of Systems  Constitutional:  Negative for fever.  HENT:  Positive for ear pain.    Physical Exam Updated Vital Signs BP 116/88 (BP Location: Right Arm)   Pulse 92   Temp 98.5 F (36.9 C) (Oral)   Resp 19   Ht $R'5\' 5"'Ou$  (1.651 m)   Wt 124.7 kg   SpO2 98%   BMI 45.76 kg/m   Physical Exam Vitals and nursing note reviewed.  Constitutional:      Appearance: Normal appearance.  HENT:     Head: Normocephalic.     Right Ear: A foreign body (Insect) is present.     Ears:     Comments: Abrasion and bleeding of right ear canal noted Eyes:     Pupils: Pupils are equal, round, and reactive to light.  Cardiovascular:     Rate and Rhythm: Normal rate.  Pulmonary:     Effort: Pulmonary effort is normal.  Neurological:     Mental Status: She is alert.    ED Results / Procedures / Treatments   Labs (all labs ordered are listed, but only abnormal results are displayed) Labs Reviewed - No data to display  EKG None  Radiology No results found.  Procedures .Foreign Body Removal  Date/Time: 07/06/2021 3:44 AM Performed by: Orpah Greek, MD Authorized by: Orpah Greek, MD  Consent: Verbal consent obtained. Consent given by: patient Patient understanding: patient states understanding of the procedure being performed Patient consent: the patient's understanding of the procedure matches consent given Procedure consent: procedure consent  matches procedure scheduled Site marked: the operative site was marked Required items: required blood products, implants, devices, and special equipment available Patient identity  confirmed: verbally with patient Time out: Immediately prior to procedure a "time out" was called to verify the correct patient, procedure, equipment, support staff and site/side marked as required. Body area: ear Location details: right ear  Sedation: Patient sedated: no  Patient restrained: no Patient cooperative: yes Localization method: ENT speculum Removal mechanism: alligator forceps Complexity: simple 1 objects recovered. Objects recovered: insect Post-procedure assessment: foreign body removed Patient tolerance: patient tolerated the procedure well with no immediate complications    Medications Ordered in ED Medications  ciprofloxacin-dexamethasone (CIPRODEX) 0.3-0.1 % OTIC (EAR) suspension 4 drop (has no administration in time range)    ED Course  I have reviewed the triage vital signs and the nursing notes.  Pertinent labs & imaging results that were available during my care of the patient were reviewed by me and considered in my medical decision making (see chart for details).    MDM Rules/Calculators/A&P                           Patient with possible foreign body in the right ear.  She tried to did get out earlier and flush it.  Examination does reveal something that looked like an insect.  There was bleeding of the canal at arrival.  Attempts to remove the foreign body with a curette were unsuccessful.  I was able to grab it with alligator forceps and remove it.  It was some type of flying insect.  Final Clinical Impression(s) / ED Diagnoses Final diagnoses:  FB ear, right, initial encounter    Rx / DC Orders ED Discharge Orders     None        Tyquon Near, Gwenyth Allegra, MD 07/06/21 [phone]

## 2021-07-06 NOTE — ED Triage Notes (Signed)
Pt to ED from home with earache to right ear, says it started a few hours ago.

## 2021-07-08 ENCOUNTER — Telehealth: Payer: Self-pay

## 2021-07-08 NOTE — Telephone Encounter (Signed)
Transition Care Management Unsuccessful Follow-up Telephone Call  Date of discharge and from where:  07/06/2021-[place] [place]   Attempts:  1st Attempt  Reason for unsuccessful TCM follow-up call:  Left voice message

## 2021-07-09 NOTE — Telephone Encounter (Signed)
Transition Care Management Unsuccessful Follow-up Telephone Call  Date of discharge and from where:  07/06/2021 from [facility] P[facility]  Attempts:  2nd Attempt  Reason for unsuccessful TCM follow-up call:  Left voice message

## 2021-07-10 NOTE — Telephone Encounter (Signed)
Transition Care Management Unsuccessful Follow-up Telephone Call ° °Date of discharge and from where:  07/06/2021-[place]  ° °Attempts:  3rd Attempt ° °Reason for unsuccessful TCM follow-up call:  Left voice message ° °  °

## 2021-09-02 ENCOUNTER — Encounter: Payer: Self-pay | Admitting: Adult Health

## 2021-09-02 NOTE — Telephone Encounter (Signed)
She is on Eliquis for evidence of PE during prior admission - this was not related to her stroke nor on Eliquis for stroke prevention therefore this should be managed by PCP. If cleared to stop Eliquis by PCP, will recommend starting aspirin 81mg  daily. Thank you.

## 2021-09-10 DIAGNOSIS — Z86711 Personal history of pulmonary embolism: Secondary | ICD-10-CM | POA: Diagnosis not present

## 2021-09-10 DIAGNOSIS — Z8673 Personal history of transient ischemic attack (TIA), and cerebral infarction without residual deficits: Secondary | ICD-10-CM | POA: Diagnosis not present

## 2021-09-16 ENCOUNTER — Ambulatory Visit: Payer: Medicaid Other | Admitting: Podiatry

## 2021-09-17 ENCOUNTER — Ambulatory Visit (INDEPENDENT_AMBULATORY_CARE_PROVIDER_SITE_OTHER): Payer: Medicaid Other

## 2021-09-17 ENCOUNTER — Other Ambulatory Visit: Payer: Self-pay

## 2021-09-17 ENCOUNTER — Ambulatory Visit: Payer: Medicaid Other | Admitting: Podiatry

## 2021-09-17 VITALS — BP 150/97 | HR 92 | Temp 97.5°F | Resp 18

## 2021-09-17 DIAGNOSIS — M778 Other enthesopathies, not elsewhere classified: Secondary | ICD-10-CM

## 2021-09-17 DIAGNOSIS — B351 Tinea unguium: Secondary | ICD-10-CM

## 2021-09-17 DIAGNOSIS — M21612 Bunion of left foot: Secondary | ICD-10-CM | POA: Diagnosis not present

## 2021-09-17 DIAGNOSIS — M79672 Pain in left foot: Secondary | ICD-10-CM

## 2021-09-17 DIAGNOSIS — E119 Type 2 diabetes mellitus without complications: Secondary | ICD-10-CM | POA: Diagnosis not present

## 2021-09-17 MED ORDER — EFINACONAZOLE 10 % EX SOLN: 1.0000 [drp] | Freq: Every day | CUTANEOUS | 11 refills | Status: DC

## 2021-09-17 NOTE — Patient Instructions (Signed)
Bunion A bunion (hallux valgus) is a bump that forms slowly on the inner side of the big toe joint. It occurs when the big toe turns toward the second toe. Bunions may be small at first, but they often get larger over time. They can make walking painful. What are the causes? This condition may be caused by: Wearing narrow or pointed shoes that force the big toe to press against the other toes. Abnormal foot development that causes the foot to roll inward. Changes in the foot that are caused by certain diseases, such as rheumatoid arthritis or polio. A foot injury. What increases the risk? The following factors may make you more likely to develop this condition: Wearing shoes that squeeze the toes together. Having certain diseases, such as: Rheumatoid arthritis. Polio. Cerebral palsy. Having family members who have bunions. Being born with abnormally shaped feet (a foot deformity), such as flat feet or low arches. Doing activities that put a lot of pressure on the feet, such as ballet dancing. What are the signs or symptoms? The main symptom of this condition is a bump on your big toe that you can notice. Other symptoms may include: Pain. Redness and inflammation around your big toe. Thick or hardened skin on your big toe or between your toes. Stiffness or loss of motion in your big toe. Trouble with walking. How is this diagnosed? This condition may be diagnosed based on your symptoms, medical history, and activities. You may also have tests and imaging, such as: X-rays. These allow your health care provider to check the position of the bones in your foot and look for damage to your joint. They also help your health care provider determine the severity of your bunion and the best way to treat it. Joint aspiration. In this test, a sample of fluid is removed from the toe joint. This test may be done if you are in a lot of pain. It helps rule out diseases that cause painful swelling of the  joints, such as arthritis or gout. How is this treated? Treatment depends on the severity of your symptoms. The goal of treatment is to relieve symptoms and prevent your bunion from getting worse. Your health care provider may recommend: Wearing shoes that have a wide toe box, or using bunion pads to cushion the affected area. Taping your toes together to keep them in a normal position. Placing a device inside your shoe (orthotic device) to help reduce pressure on your toe joint. Taking medicine to ease pain and inflammation. Putting ice or heat on the affected area. Doing stretching exercises. Surgery, for severe cases. Follow these instructions at home: Managing pain, stiffness, and swelling   If directed, put ice on the painful area. To do this: Put ice in a plastic bag. Place a towel between your skin and the bag. Leave the ice on for 20 minutes, 2-3 times a day. Remove the ice if your skin turns bright red. This is very important. If you cannot feel pain, heat, or cold, you have a greater risk of damage to the area. If directed, apply heat to the affected area before you exercise. Use the heat source that your health care provider recommends, such as a moist heat pack or a heating pad. Place a towel between your skin and the heat source. Leave the heat on for 20-30 minutes. Remove the heat if your skin turns bright red. This is especially important if you are unable to feel pain, heat, or cold. You   have a greater risk of getting burned. General instructions Do exercises as told by your health care provider. Support your toe joint with proper footwear, shoe padding, or taping as told by your health care provider. Take over-the-counter and prescription medicines only as told by your health care provider. Do not use any products that contain nicotine or tobacco, such as cigarettes, e-cigarettes, and chewing tobacco. If you need help quitting, ask your health care provider. Keep all  follow-up visits. This is important. Contact a health care provider if: Your symptoms get worse. Your symptoms do not improve in 2 weeks. Get help right away if: You have severe pain and trouble with walking. Summary A bunion is a bump on the inner side of the big toe joint that forms when the big toe turns toward the second toe. Bunions can make walking painful. Treatment depends on the severity of your symptoms. Support your toe joint with proper footwear, shoe padding, or taping as told by your health care provider. This information is not intended to replace advice given to you by your health care provider. Make sure you discuss any questions you have with your health care provider. Document Revised: 11/18/2019 Document Reviewed: 11/18/2019 Elsevier Patient Education  2022 [employer] Diabetes Mellitus and Foot Care Foot care is an important part of your health, especially when you have diabetes. Diabetes may cause you to have problems because of poor blood flow (circulation) to your feet and legs, which can cause your skin to: Become thinner and drier. Break more easily. Heal more slowly. Peel and crack. You may also have nerve damage (neuropathy) in your legs and feet, causing decreased feeling in them. This means that you may not notice minor injuries to your feet that could lead to more serious problems. Noticing and addressing any potential problems early is the best way to prevent future foot problems. How to care for your feet Foot hygiene  Wash your feet daily with warm water and mild soap. Do not use hot water. Then, pat your feet and the areas between your toes until they are completely dry. Do not soak your feet as this can dry your skin. Trim your toenails straight across. Do not dig under them or around the cuticle. File the edges of your nails with an emery board or nail file. Apply a moisturizing lotion or petroleum jelly to the skin on your feet and to dry, brittle  toenails. Use lotion that does not contain alcohol and is unscented. Do not apply lotion between your toes. Shoes and socks Wear clean socks or stockings every day. Make sure they are not too tight. Do not wear knee-high stockings since they may decrease blood flow to your legs. Wear shoes that fit properly and have enough cushioning. Always look in your shoes before you put them on to be sure there are no objects inside. To break in new shoes, wear them for just a few hours a day. This prevents injuries on your feet. Wounds, scrapes, corns, and calluses  Check your feet daily for blisters, cuts, bruises, sores, and redness. If you cannot see the bottom of your feet, use a mirror or ask someone for help. Do not cut corns or calluses or try to remove them with medicine. If you find a minor scrape, cut, or break in the skin on your feet, keep it and the skin around it clean and dry. You may clean these areas with mild soap and water. Do not clean the area   with peroxide, alcohol, or iodine. If you have a wound, scrape, corn, or callus on your foot, look at it several times a day to make sure it is healing and not infected. Check for: Redness, swelling, or pain. Fluid or blood. Warmth. Pus or a bad smell. General tips Do not cross your legs. This may decrease blood flow to your feet. Do not use heating pads or hot water bottles on your feet. They may burn your skin. If you have lost feeling in your feet or legs, you may not know this is happening until it is too late. Protect your feet from hot and cold by wearing shoes, such as at the beach or on hot pavement. Schedule a complete foot exam at least once a year (annually) or more often if you have foot problems. Report any cuts, sores, or bruises to your health care provider immediately. Where to find more information American Diabetes Association: www.diabetes.org Association of Diabetes [employer] Education Specialists:  www.diabeteseducator.org Contact a health care provider if: You have a medical condition that increases your risk of infection and you have any cuts, sores, or bruises on your feet. You have an injury that is not healing. You have redness on your legs or feet. You feel burning or tingling in your legs or feet. You have pain or cramps in your legs and feet. Your legs or feet are numb. Your feet always feel cold. You have pain around any toenails. Get help right away if: You have a wound, scrape, corn, or callus on your foot and: You have pain, swelling, or redness that gets worse. You have fluid or blood coming from the wound, scrape, corn, or callus. Your wound, scrape, corn, or callus feels warm to the touch. You have pus or a bad smell coming from the wound, scrape, corn, or callus. You have a fever. You have a red line going up your leg. Summary Check your feet every day for blisters, cuts, bruises, sores, and redness. Apply a moisturizing lotion or petroleum jelly to the skin on your feet and to dry, brittle toenails. Wear shoes that fit properly and have enough cushioning. If you have foot problems, report any cuts, sores, or bruises to your health care provider immediately. Schedule a complete foot exam at least once a year (annually) or more often if you have foot problems. This information is not intended to replace advice given to you by your health care provider. Make sure you discuss any questions you have with your health care provider. Document Revised: 02/02/2020 Document Reviewed: 02/02/2020 Elsevier Patient Education  2022 Else[employer] Inc[employer]  

## 2021-09-17 NOTE — Progress Notes (Signed)
Subjective:   [name], female   DOB: 41 y.o.   MRN: [id number]   HPI 41 year old female presents the office today for diabetic foot exam as well as for elongated nails as well as a bunion on her left foot.  She states that the bunion itself hurts has been on about 1 year.  She has a sharp pain at times and the pain is intermittent.  She takes over-the-counter medication to help when it does aggravate her.  She had no other treatment.  She is diabetic and last A1c was 6.6.  Last glucose was 130.  Denies any numbness or tingling or any claudication symptoms.  No open lesions.   Review of Systems  All other systems reviewed and are negative.  Past Medical History:  Diagnosis Date   Depression    Diabetes mellitus without complication ([place])    Hyperlipidemia    Hypertension    Hypoactive thyroid    Hypothyroid    Mental disorder    depression and PTSD   Stroke ([place]) 07/09/2020    Past Surgical History:  Procedure Laterality Date   CESAREAN SECTION       Current Outpatient Medications:    Efinaconazole 10 % SOLN, Apply 1 drop topically daily., Disp: 4 mL, Rfl: 11   Accu-Chek FastClix Lancets MISC, Use to check blood glucose twice a day, Disp: 60 each, Rfl: 0   acetaminophen (TYLENOL) 325 MG tablet, Take 650 mg by mouth every 6 (six) hours as needed. , Disp: , Rfl:    apixaban (ELIQUIS) 5 MG TABS tablet, TAKE 2 TABLETS (10 MG TOTAL) BY MOUTH 2 (TWO) TIMES DAILY FOR 5 DAYS, THEN DECREASE TO 1 TABLET ($RemoveB'5MG'HJCDLUcZ$ ) TWICE DAILY, Disp: 74 tablet, Rfl: 0   atorvastatin (LIPITOR) 20 MG tablet, Take 1 tablet (20 mg total) by mouth daily., Disp: 90 tablet, Rfl: 3   blood glucose meter kit and supplies KIT, Dispense based on patient and insurance preference. Use up to four times daily as directed. (FOR ICD-9 250.00, 250.01)., Disp: 1 each, Rfl: 0   Dulaglutide 0.75 MG/0.5ML SOPN, Inject 0.75 mg into the skin once a week., Disp: , Rfl:    etonogestrel (NEXPLANON) 68 MG IMPL  implant, 1 each by Subdermal route once., Disp: , Rfl:    ferrous sulfate 325 (65 FE) MG tablet, TAKE 1 TABLET (325 MG TOTAL) BY MOUTH 2 (TWO) TIMES DAILY WITH A MEAL., Disp: 60 tablet, Rfl: 0   FLUoxetine (PROZAC) 20 MG capsule, Take three pills each morning as directed. (Patient taking differently: Take 20 mg by mouth daily.), Disp: 90 capsule, Rfl: 3   glucose blood (ACCU-CHEK GUIDE) test strip, Use to check glucose twice a day, Disp: 60 each, Rfl: 0   levothyroxine (SYNTHROID) 175 MCG tablet, Take 175 mcg by mouth daily., Disp: , Rfl:    lisinopril (ZESTRIL) 10 MG tablet, Take 10 mg by mouth daily., Disp: , Rfl:    metFORMIN (GLUCOPHAGE) 500 MG tablet, TAKE 1 TABLET BY MOUTH WITH BREAKFAST AND 2 TABLETS WITH EVENING MEAL (Patient taking differently: 2 (two) times daily. TAKE 1 TABLET BY MOUTH WITH BREAKFAST AND 2 TABLETS WITH EVENING MEAL), Disp: 90 tablet, Rfl: 0   sitaGLIPtin (JANUVIA) 50 MG tablet, Take 50 mg by mouth daily., Disp: , Rfl:   No Known Allergies        Objective:  Physical Exam  General: AAO x3, NAD  Dermatological: Toenails are elongated with mild yellow-brown discoloration of the distal portion of  the nails.  There is no edema, erythema or signs of infection.  No significant tenderness to palpation of the nails today.  No open lesions.  Vascular: Dorsalis Pedis artery and Posterior Tibial artery pedal pulses are 2/4 bilateral with immedate capillary fill time.  There is no pain with calf compression, swelling, warmth, erythema.   Neruologic: Grossly intact via light touch bilateral.  Sensation intact with Semmes Weinstein monofilament.  Musculoskeletal: Mild to moderate bunion present on the left foot.  There is no significant tenderness palpation today over the [place] tenderness is localized in the medial eminence of the first metatarsal head.  There is no pain or crepitation with MPJ range of motion.  Muscular strength 5/5 in all groups tested bilateral.  Gait:  Unassisted, Nonantalgic.       Assessment:   41 year old female with symptomatic left foot bunion, abnormal ptosis, type 2 diabetes     Plan:  -Treatment options discussed including all alternatives, risks, and complications -Etiology of symptoms were discussed -X-rays were obtained and reviewed with the patient. Moderate bunion is present with moderate increase in intermetatarsal angle.  No evidence of acute fracture. -Regards to the bunion we discussed both conservative as well as surgical treatment options.  Conservatively discussed shoe modifications as well as offloading pads I dispensed a bunion silicone sleeve today.  Discussed surgical intervention in the future should symptoms continue.  We discussed also bunionectomy. -As a courtesy debride the nails x10 without any complications or bleeding.  Prescribe Jublia. -Discussed daily foot inspection.  Trula Slade DPM

## 2021-09-23 ENCOUNTER — Ambulatory Visit: Payer: Medicaid Other | Admitting: Adult Health

## 2021-09-24 ENCOUNTER — Other Ambulatory Visit: Payer: Self-pay | Admitting: Podiatry

## 2021-09-24 DIAGNOSIS — M778 Other enthesopathies, not elsewhere classified: Secondary | ICD-10-CM

## 2021-09-27 ENCOUNTER — Other Ambulatory Visit: Payer: Self-pay

## 2021-09-27 ENCOUNTER — Encounter: Payer: Self-pay | Admitting: Adult Health

## 2021-09-27 ENCOUNTER — Ambulatory Visit: Payer: Medicaid Other | Admitting: Adult Health

## 2021-09-27 VITALS — BP 125/86 | HR 71 | Ht 65.0 in | Wt 276.0 lb

## 2021-09-27 DIAGNOSIS — Z9189 Other specified personal risk factors, not elsewhere classified: Secondary | ICD-10-CM | POA: Diagnosis not present

## 2021-09-27 DIAGNOSIS — Z86711 Personal history of pulmonary embolism: Secondary | ICD-10-CM | POA: Diagnosis not present

## 2021-09-27 DIAGNOSIS — I63511 Cerebral infarction due to unspecified occlusion or stenosis of right middle cerebral artery: Secondary | ICD-10-CM

## 2021-09-27 DIAGNOSIS — E119 Type 2 diabetes mellitus without complications: Secondary | ICD-10-CM

## 2021-09-27 DIAGNOSIS — E785 Hyperlipidemia, unspecified: Secondary | ICD-10-CM

## 2021-09-27 NOTE — Progress Notes (Signed)
[facility] [address] [place]. [place] [address] [phone]       OFFICE FOLLOW UP NOTE  [name] Date of Birth:  03/26/1981 Medical Record Number:  [id number]   Referring MD: Rosalin Hawking Reason for Referral: Stroke   Chief Complaint  Patient presents with   Follow-up    RM 3 alone Pt is well and stable, no new concerns        HPI:   Update 09/27/2021 JM: Patient returns for 62-month stroke follow-up.  Overall stable without new or reoccurring stroke/TIA symptoms.  Remains on Eliquis and atorvastatin without side effects. Was seen by hematology 2/14, personally reviewed OV note, recommended continued lifelong use of Eliquis due to increased risk of recurrent thrombosis in setting of morbid obesity and progesterone implant.  Deferred further hereditary or acquired factors work-up if significant weight loss should occur and no longer using progestins.  Blood pressure today 125/86.  Reports prior lab work 06/2021: LDL 98, A1c 6.6. Adjustments made to diabetic regimen.  Has f/u visit with PCP in June. She has been working on dietary changes. Does report daytime fatigue and insomnia. Unsure if snores. Denies morning headaches. Has not previously underwent sleep study.  No further concerns at this time.    History provided for reference purposes only Update 03/21/2021 JM: [name] returns for 59-month stroke follow-up.  Overall doing well.  Denies new or reoccurring stroke/TIA symptoms.  Remains on Eliquis 5 mg twice daily and atorvastatin 20 mg daily without side effects.  Blood pressure today 130/92.  Most recent A1c 6.1 (11/2020).  She continues to use Nexplanon implant to help control heavy menstrual cycles. No concerns at this time.  Initial visit 09/19/2020 Dr. Leonie Man: [name] is a 41 year old pleasant Caucasian lady seen today for initial office consultation visit for stroke.  History is obtained from the patient and review of electronic medical  records and personally reviewed pertinent imaging films in PACS.  She has past medical history of diabetes, hypertension, hyperlipidemia Koebel obesity and depression.  She presented on 07/09/2020 to [facility] for sudden onset of left-sided numbness and weakness.  She was seen on telemetry neurology consultation by Dr. Rory Percy and found to have mild left hand weakness and sensory loss and after discussion of risk benefit she was treated with IV TPA.  She had a history of recent Covid infection on 06/27/2020 and she had been not vaccinated at that time.  She was transferred to [facility] where she was hospitalized.  CT angiogram of brain and neck showed no significant large vessel stenosis or occlusion.  MRI scan showed patchy right MCA infarct in the posterior division.  2D echo showed ejection fraction of 55 to 60% without cardiac source of embolism.  Lower extremity venous Dopplers were negative for DVT.  TCD bubble study was negative for PFO.  LDL cholesterol was 69 mg percent.  Hemoglobin A1c was 8.6.  Urine drug screen was negative.  HIV was negative.  RPR was negative.  Hypercoagulable panel labs were all negative.  ANA panel was negative.  Patient was initially started on IV heparin as she was found to have bilateral pulmonary emboli on CT angiogram of the chest.  She subsequently switched to Eliquis.  She states she is done well she is made full recovery her left-sided numbness as well as left hand weakness have completely recovered.  She has no residual deficits.  Patient was also recent estrogen progesterone implant prior to  her stroke and she was asked to discuss this with her gynecologist but she feels because of heavy menstrual cycles she will stay on it as long as she is on the Eliquis.  She states she is done a much better job in controlling her sugars and last A1c has come down to below 7.  Blood pressure is also well controlled today it is 130/68 and she is on lisinopril.  She is  tolerating Lipitor well without muscle aches and pains.  She has no complaints today.     ROS:   14 system review of systems is positive for no complaints and all other systems negative PMH:  Past Medical History:  Diagnosis Date   Depression    Diabetes mellitus without complication ([place])    Hyperlipidemia    Hypertension    Hypoactive thyroid    Hypothyroid    Mental disorder    depression and PTSD   Stroke ([place]) 07/09/2020    Social History:  Social History   Socioeconomic History   Marital status: Significant Other    Spouse name: Not on file   Number of children: Not on file   Years of education: Not on file   Highest education level: Not on file  Occupational History   Not on file  Tobacco Use   Smoking status: Never   Smokeless tobacco: Never  Vaping Use   Vaping Use: Never used  Substance and Sexual Activity   Alcohol use: Yes    Comment: "very rarely"   Drug use: No   Sexual activity: Not Currently    Partners: Male    Birth control/protection: None  Other Topics Concern   Not on file  Social History Narrative   Lives alone with 2 kids   Right Handed   Drinks4-5 cups caffeine daily   Social Determinants of Health   Financial Resource Strain: Not on file  Food Insecurity: Not on file  Transportation Needs: Not on file  Physical Activity: Not on file  Stress: Not on file  Social Connections: Not on file  Intimate Partner Violence: Not on file    Medications:   Current Outpatient Medications on File Prior to Visit  Medication Sig Dispense Refill   Accu-Chek FastClix Lancets MISC Use to check blood glucose twice a day 60 each 0   acetaminophen (TYLENOL) 325 MG tablet Take 650 mg by mouth every 6 (six) hours as needed.      apixaban (ELIQUIS) 5 MG TABS tablet TAKE 2 TABLETS (10 MG TOTAL) BY MOUTH 2 (TWO) TIMES DAILY FOR 5 DAYS, THEN DECREASE TO 1 TABLET ($RemoveB'5MG'OKFwNSIM$ ) TWICE DAILY 74 tablet 0   atorvastatin (LIPITOR) 20 MG tablet Take 1 tablet (20 mg  total) by mouth daily. 90 tablet 3   blood glucose meter kit and supplies KIT Dispense based on patient and insurance preference. Use up to four times daily as directed. (FOR ICD-9 250.00, 250.01). 1 each 0   Dulaglutide 1.5 MG/0.5ML SOPN Inject 1.5 mg into the skin once a week.     Efinaconazole 10 % SOLN Apply 1 drop topically daily. 4 mL 11   etonogestrel (NEXPLANON) 68 MG IMPL implant 1 each by Subdermal route once.     FLUoxetine (PROZAC) 20 MG capsule Take three pills each morning as directed. (Patient taking differently: Take 20 mg by mouth daily.) 90 capsule 3   glucose blood (ACCU-CHEK GUIDE) test strip Use to check glucose twice a day 60 each 0   levothyroxine (SYNTHROID) 175  MCG tablet Take 175 mcg by mouth daily.     lisinopril (ZESTRIL) 10 MG tablet Take 10 mg by mouth daily.     metFORMIN (GLUCOPHAGE) 500 MG tablet TAKE 1 TABLET BY MOUTH WITH BREAKFAST AND 2 TABLETS WITH EVENING MEAL (Patient taking differently: 1,000 mg 2 (two) times daily. TAKE 1 TABLET BY MOUTH WITH BREAKFAST AND 2 TABLETS WITH EVENING MEAL) 90 tablet 0   ferrous sulfate 325 (65 FE) MG tablet TAKE 1 TABLET (325 MG TOTAL) BY MOUTH 2 (TWO) TIMES DAILY WITH A MEAL. 60 tablet 0   No current facility-administered medications on file prior to visit.    Allergies:  No Known Allergies  Physical Exam Today's Vitals   09/27/21 0755  BP: 125/86  Pulse: 71  Weight: 276 lb (125.2 kg)  Height: $Remove'5\' 5"'PaJONyv$  (1.651 m)   Body mass index is 45.93 kg/m.    General: Morbidly obese very pleasant middle-aged Caucasian lady, seated, in no evident distress Head: head normocephalic and atraumatic.   Neck: supple with no carotid or supraclavicular bruits Cardiovascular: regular rate and rhythm, no murmurs Musculoskeletal: no deformity Skin:  no rash/petichiae Vascular:  Normal pulses all extremities  Neurologic Exam Mental Status: Awake and fully alert.  Fluent speech and language.  Oriented to place and time. Recent and remote  memory intact. Attention span, concentration and fund of knowledge appropriate. Mood and affect appropriate.  Cranial Nerves: Pupils equal, briskly reactive to light. Extraocular movements full without nystagmus. Visual fields full to confrontation. Hearing intact. Facial sensation intact. Face, tongue, palate moves normally and symmetrically.  Motor: Normal bulk and tone. Normal strength in all tested extremity muscles. Sensory.: intact to touch , pinprick , position and vibratory sensation.  Coordination: Rapid alternating movements normal in all extremities. Finger-to-nose and heel-to-shin performed accurately bilaterally. Gait and Station: Arises from chair without difficulty. Stance is normal. Gait demonstrates normal stride length and balance . Able to heel, toe and tandem walk without difficulty.  Reflexes: 1+ and symmetric. Toes downgoing.      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  Also found to have bilateral PE and started on Eliquis.  Vascular risk factors of diabetes, hypertension, hyperlipidemia, obesity, COVID related hypercoagulability and birth control implant.    1.  Right MCA stroke -Recovered without residual deficit. -Continue Eliquis (apixaban) daily (per hemat rec's, see below) and atorvastatin 20 mg daily for secondary stroke prevention -HTN: BP goal <130/90. stable on current regimen per PCP -HLD: LDL goal <70.  LDL 98 (06/2021) on atorvastatin $RemoveBeforeD'20mg'tiomsSudiobMJl$  daily managed by PCP.  Repeat lipid panel today - advised if LDL remains above goal, will plan on increasing atorvastatin dosage -DM: A1c goal<7.  Well-controlled with A1c 6.6 (06/2021) monitored by PCP. Repeat lipid panel today   2.  Hx of Bilateral PE -Per hematology recommendations, will continue Eliquis lifelong due to morbid obesity and  progesterone contraceptive -defer ongoing management to PCP   3.  At risk for sleep apnea -Discussed undergoing further evaluation for possible sleep apnea but declines interest at this time.  Discussed increased risk of recurrent stroke and cardiovascular disease with untreated sleep apnea.  She verbalized understanding and will call office if interested in pursuing in the future     Doing well from stroke standpoint and risk factors are managed by PCP. She may follow up PRN, as usual for our  patients who are strictly being followed for stroke. If any new neurological issues should arise, request PCP place referral for evaluation by one of our neurologists. Thank you.      CC:  Kotturi, Tyler Deis, MD   I spent 34 minutes of face-to-face and non-face-to-face time with patient.  This included previsit chart review, lab review, study review, order entry, electronic health record documentation, patient education and discussion regarding prior stroke, secondary stroke prevention measures, aggressive stroke risk factor management, ongoing use of AC and review of hemat note, possible underlying sleep apnea and answered all other questions to patients satisfaction  Frann Rider, AGNP-BC  [facility] [address] [place],  [address]  Phone [phone] Fax [phone] Note: This document was prepared with digital dictation and possible smart phrase technology. Any transcriptional errors that result from this process are unintentional.

## 2021-09-27 NOTE — Patient Instructions (Addendum)
Continue Eliquis (apixaban) daily  and atorvastatin for secondary stroke prevention -please continue to follow with your PCP for ongoing refills and management ? ?We will check your cholesterol levels and A1c today  ? ?Consider evaluation for possible underlying sleep apnea - if you would like to pursue further evaluation, please let us know! ? ?Continue to follow up with PCP regarding cholesterol, blood pressure and diabetes management  ?Maintain strict control of hypertension with blood pressure goal below 130/90, diabetes with hemoglobin A1c goal below 7.0 % and cholesterol with LDL cholesterol (bad cholesterol) goal below 70 mg/dL.  ? ?Signs of a Stroke? Follow the BEFAST method:  ?Balance Watch for a sudden loss of balance, trouble with coordination or vertigo ?Eyes Is there a sudden loss of vision in one or both eyes? Or double vision?  ?Face: Ask the person to smile. Does one side of the face droop or is it numb?  ?Arms: Ask the person to raise both arms. Does one arm drift downward? Is there weakness or numbness of a leg? ?Speech: Ask the person to repeat a simple phrase. Does the speech sound slurred/strange? Is the person confused ? ?Time: If you observe any of these signs, call 911. ? ? ? ? ? ? ? ? ?Thank you for coming to see [place] at [facility]. I hope we have been able to provide you high quality care today. ? ?You may receive a patient satisfaction survey over the next few weeks. We would appreciate your feedback and comments so that we may continue to improve ourselves and the health of our patients. ? ? ? ?Stroke Prevention ?Some medical conditions and lifestyle choices can lead to a higher risk for a stroke. You can help to prevent a stroke by eating healthy foods and exercising. It also helps to not smoke and to manage any health problems you may have. ?How can this condition affect me? ?A stroke is an emergency. It should be treated right away. A stroke can lead to brain damage  or threaten your life. There is a better chance of surviving and getting better after a stroke if you get medical help right away. ?What can increase my risk? ?The following medical conditions may increase your risk of a stroke: ?Diseases of the heart and blood vessels (cardiovascular disease). ?High blood pressure (hypertension). ?Diabetes. ?High cholesterol. ?Sickle cell disease. ?Problems with blood clotting. ?Being very overweight. ?Sleeping problems (obstructivesleep apnea). ?Other risk factors include: ?Being older than age 55. ?A history of blood clots, stroke, or mini-stroke (TIA). ?Race, ethnic background, or a family history of stroke. ?Smoking or using tobacco products. ?Taking birth control pills, especially if you smoke. ?Heavy alcohol and drug use. ?Not being active. ?What actions can I take to prevent this? ?Manage your health conditions ?High cholesterol. ?Eat a healthy diet. If this is not enough to manage your cholesterol, you may need to take medicines. ?Take medicines as told by your doctor. ?High blood pressure. ?Try to keep your blood pressure below 130/80. ?If your blood pressure cannot be managed through a healthy diet and regular exercise, you may need to take medicines. ?Take medicines as told by your doctor. ?Ask your doctor if you should check your blood pressure at home. ?Have your blood pressure checked every year. ?Diabetes. ?Eat a healthy diet and get regular exercise. If your blood sugar (glucose) cannot be managed through diet and exercise, you may need to take medicines. ?Take medicines as told by your doctor. ?Talk to your  doctor about getting checked for sleeping problems. Signs of a problem can include: ?Snoring a lot. ?Feeling very tired. ?Make sure that you manage any other conditions you have. ?Nutrition ? ?Follow instructions from your doctor about what to eat or drink. You may be told to: ?Eat and drink fewer calories each day. ?Limit how much salt (sodium) you use to 1,500  milligrams (mg) each day. ?Use only healthy fats for cooking, such as olive oil, canola oil, and sunflower oil. ?Eat healthy foods. To do this: ?Choose foods that are high in fiber. These include whole grains, and fresh fruits and vegetables. ?Eat at least 5 servings of fruits and vegetables a day. Try to fill one-half of your plate with fruits and vegetables at each meal. ?Choose low-fat (lean) proteins. These include low-fat cuts of meat, chicken without skin, fish, tofu, beans, and nuts. ?Eat low-fat dairy products. ?Avoid foods that: ?Are high in salt. ?Have saturated fat. ?Have trans fat. ?Have cholesterol. ?Are processed or pre-made. ?Count how many carbohydrates you eat and drink each day. ?Lifestyle ?If you drink alcohol: ?Limit how much you have to: ?0-1 drink a day for women who are not pregnant. ?0-2 drinks a day for men. ?Know how much alcohol is in your drink. In the [place] one drink equals one 12 oz bottle of beer ( ), one 5 oz glass of wine ( ), or one 1? oz glass of hard liquor (40mL). ?Do not smoke or use any products that have nicotine or tobacco. If you need help quitting, ask your doctor. ?Avoid secondhand smoke. ?Do not use drugs. ?Activity ? ?Try to stay at a healthy weight. ?Get at least 30 minutes of exercise on most days, such as: ?Fast walking. ?Biking. ?Swimming. ?Medicines ?Take over-the-counter and prescription medicines only as told by your doctor. ?Avoid taking birth control pills. Talk to your doctor about the risks of taking birth control pills if: ?You are over 32 years old. ?You smoke. ?You get very bad headaches. ?You have had a blood clot. ?Where to find more information ?American Stroke Association: www.strokeassociation.org ?Get help right away if: ?You or a loved one has any signs of a stroke. "BE FAST" is an easy way to remember the warning signs: ?B - Balance. Dizziness, sudden trouble walking, or loss of balance. ?E - Eyes. Trouble seeing or a change in how you  see. ?F - Face. Sudden weakness or loss of feeling of the face. The face or eyelid may droop on one side. ?A - Arms. Weakness or loss of feeling in an arm. This happens all of a sudden and most often on one side of the body. ?S - Speech. Sudden trouble speaking, slurred speech, or trouble understanding what people say. ?T - Time. Time to call emergency services. Write down what time symptoms started. ?You or a loved one has other signs of a stroke, such as: ?A sudden, very bad headache with no known cause. ?Feeling like you may vomit (nausea). ?Vomiting. ?A seizure. ?These symptoms may be an emergency. Get help right away. Call your local emergency services (911 in the [place] ?Do not wait to see if the symptoms will go away. ?Do not drive yourself to the hospital. ?Summary ?You can help to prevent a stroke by eating healthy, exercising, and not smoking. It also helps to manage any health problems you have. ?Do not smoke or use any products that contain nicotine or tobacco. ?Get help right away if you or a loved one has any signs of  a stroke. ?This information is not intended to replace advice given to you by your health care provider. Make sure you discuss any questions you have with your health care provider. ?Document Revised: 02/13/2020 Document Reviewed: 02/13/2020 ?Elsevier Patient Education ? 2022 [employer] ? ?

## 2021-09-28 LAB — LIPID PANEL
Chol/HDL Ratio: 5.2 ratio — ABNORMAL HIGH (ref 0.0–4.4)
Cholesterol, Total: 150 mg/dL (ref 100–199)
HDL: 29 mg/dL — ABNORMAL LOW (ref >39)
LDL Chol Calc (NIH): 84 mg/dL (ref 0–99)
Triglycerides: 216 mg/dL — ABNORMAL HIGH (ref 0–149)
VLDL Cholesterol Cal: 37 mg/dL (ref 5–40)

## 2021-09-28 LAB — HEMOGLOBIN A1C
Est. average glucose Bld gHb Est-mCnc: 140 mg/dL
Hgb A1c MFr Bld: 6.5 % — ABNORMAL HIGH (ref 4.8–5.6)

## 2021-09-30 ENCOUNTER — Other Ambulatory Visit: Payer: Self-pay | Admitting: Adult Health

## 2021-09-30 MED ORDER — ATORVASTATIN CALCIUM 40 MG PO TABS: 40.0000 mg | ORAL_TABLET | Freq: Every day | ORAL | 5 refills | Status: DC

## 2021-10-06 ENCOUNTER — Encounter: Payer: Self-pay | Admitting: Emergency Medicine

## 2021-10-06 ENCOUNTER — Other Ambulatory Visit: Payer: Self-pay

## 2021-10-06 ENCOUNTER — Ambulatory Visit
Admission: EM | Admit: 2021-10-06 | Discharge: 2021-10-06 | Disposition: A | Discharge status: Home / Self Care | Payer: Medicaid Other | Attending: Family Medicine | Admitting: Family Medicine

## 2021-10-06 DIAGNOSIS — J069 Acute upper respiratory infection, unspecified: Secondary | ICD-10-CM

## 2021-10-06 DIAGNOSIS — J3089 Other allergic rhinitis: Secondary | ICD-10-CM | POA: Diagnosis not present

## 2021-10-06 DIAGNOSIS — Z1152 Encounter for screening for COVID-19: Secondary | ICD-10-CM | POA: Diagnosis not present

## 2021-10-06 MED ORDER — PREDNISONE 20 MG PO TABS: 40.0000 mg | ORAL_TABLET | Freq: Every day | ORAL | 0 refills | Status: DC

## 2021-10-06 MED ORDER — PROMETHAZINE-DM 6.25-15 MG/5ML PO SYRP: 5.0000 mL | ORAL_SOLUTION | Freq: Four times a day (QID) | ORAL | 0 refills | Status: DC | PRN

## 2021-10-06 NOTE — ED Provider Notes (Signed)
[facility]    CSN: [id number] Arrival date & time: 10/06/21  0809      History   Chief Complaint Chief Complaint  Patient presents with   Cough    HPI April Carney is a 41 y.o. female.   Presenting today with 5-day history of progressively worsening hacking cough, runny nose, sore throat, hoarseness, chills and body aches off and on.  Denies fever, chest pain, shortness of breath, abdominal pain, nausea vomiting or diarrhea.  So far trying over-the-counter cold and congestion medications, allergy regimen with minimal relief.  No known history of chronic pulmonary disease.  Multiple sick contacts recently.   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   CVA (cerebral vascular accident) ([place]) 07/09/2020   Nexplanon insertion 05/25/2020   Anemia 08/22/2019   Elevated liver function tests 06/02/2017   Superficial fungus infection of skin 07/01/2016   Essential hypertension 07/01/2016   Diabetes mellitus ([place]) 12/12/2015   Hyperlipidemia 12/12/2015   Situational depression 12/11/2015   Family h/o Biliary atresia 04/24/2015   History of preterm delivery 04/24/2015   History of cesarean section 04/24/2015   Obesity 04/24/2015   Hypothyroidism 04/24/2015    Past Surgical History:  Procedure Laterality Date   CESAREAN SECTION      OB History     Gravida  2   Para  2   Term  1   Preterm  1   AB      Living  2      SAB      IAB      Ectopic      Multiple      Live Births  2            Home Medications    Prior to Admission medications   Medication Sig Start Date End Date Taking? Authorizing Provider  predniSONE (DELTASONE)  20 MG tablet Take 2 tablets (40 mg total) by mouth daily with breakfast. 10/06/21  Yes [name], PA-C  promethazine-dextromethorphan (PROMETHAZINE-DM) 6.25-15 MG/5ML syrup Take 5 mLs by mouth 4 (four) times daily as needed. 10/06/21  Yes [name], PA-C  Accu-Chek FastClix Lancets MISC Use to check blood glucose twice a day 01/02/20   Maryruth Hancock, MD  acetaminophen (TYLENOL) 325 MG tablet Take 650 mg by mouth every 6 (six) hours as needed.     [provider]  apixaban (ELIQUIS) 5 MG TABS tablet TAKE 2 TABLETS (10 MG TOTAL) BY MOUTH 2 (TWO) TIMES DAILY FOR 5 DAYS, THEN DECREASE TO 1 TABLET ($RemoveB'5MG'OHXDhBQd$ ) TWICE DAILY 07/16/20 09/27/21  Thurnell Lose, MD  atorvastatin (LIPITOR) 40 MG tablet Take 1 tablet (40 mg total) by mouth daily. 09/30/21   Frann Rider, NP  blood glucose meter kit and supplies KIT Dispense based on patient and insurance preference. Use up to four times daily as directed. (FOR ICD-9 250.00, 250.01). 05/31/19   Corum, Rex Kras, MD  Dulaglutide 1.5 MG/0.5ML SOPN Inject 1.5 mg into the skin once a week.    [provider]  Efinaconazole 10 % SOLN Apply 1 drop topically daily. 09/17/21  Vivi Barrack, DPM  etonogestrel (NEXPLANON) 68 MG IMPL implant 1 each by Subdermal route once.    [provider]  ferrous sulfate 325 (65 FE) MG tablet TAKE 1 TABLET (325 MG TOTAL) BY MOUTH 2 (TWO) TIMES DAILY WITH A MEAL. 07/16/20 07/16/21  Leroy Sea, MD  FLUoxetine (PROZAC) 20 MG capsule Take three pills each morning as directed. Patient taking differently: Take 20 mg by mouth daily. 04/14/18   Aliene Beams, MD  glucose blood (ACCU-CHEK GUIDE) test strip Use to check glucose twice a day 01/02/20   Wandra Feinstein, MD  levothyroxine (SYNTHROID) 175 MCG tablet Take 175 mcg by mouth daily.    [provider]  lisinopril (ZESTRIL) 10 MG tablet Take 10 mg by mouth daily.    [provider]  metFORMIN (GLUCOPHAGE) 500 MG tablet TAKE 1  TABLET BY MOUTH WITH BREAKFAST AND 2 TABLETS WITH EVENING MEAL Patient taking differently: 1,000 mg 2 (two) times daily. TAKE 1 TABLET BY MOUTH WITH BREAKFAST AND 2 TABLETS WITH EVENING MEAL 01/02/20   Corum, Minerva Fester, MD    Family History Family History  Problem Relation Age of Onset   Diabetes Mother    Hypertension Mother    Hyperlipidemia Mother    Kidney disease Mother    Stroke Mother    Diabetes Father    Heart disease Father    Liver disease Son        biliary atresia    Asthma Son    ADD / ADHD Son    ODD Son    Asthma Son    ADD / ADHD Son    ODD Son    Cancer Paternal Aunt    Colon cancer Neg Hx     Social History Social History   Tobacco Use   Smoking status: Never   Smokeless tobacco: Never  Vaping Use   Vaping Use: Never used  Substance Use Topics   Alcohol use: Yes    Comment: "very rarely"   Drug use: No     Allergies   Patient has no known allergies.   Review of Systems Review of Systems Per HPI  Physical Exam Triage Vital Signs ED Triage Vitals  Enc Vitals Group     BP 10/06/21 0854 124/87     Pulse Rate 10/06/21 0854 97     Resp 10/06/21 0854 18     Temp 10/06/21 0854 98.4 F (36.9 C)     Temp Source 10/06/21 0854 Oral     SpO2 10/06/21 0854 94 %     Weight 10/06/21 0855 275 lb (124.7 kg)     Height 10/06/21 0855 5\' 5"  (1.651 m)     Head Circumference --      Peak Flow --      Pain Score 10/06/21 0855 4     Pain Loc --      Pain Edu? --      Excl. in GC? --    No data found.  Updated Vital Signs BP 124/87 (BP Location: Right Arm)    Pulse 97    Temp 98.4 F (36.9 C) (Oral)    Resp 18    Ht 5\' 5"  (1.651 m)    Wt 275 lb (124.7 kg)    SpO2 94%    BMI 45.76 kg/m   Visual Acuity Right Eye Distance:   Left Eye Distance:   Bilateral Distance:    Right Eye Near:   Left Eye Near:  Bilateral Near:     Physical Exam Vitals and nursing note reviewed.  Constitutional:      Appearance: Normal appearance.  HENT:     Head:  Atraumatic.     Right Ear: Tympanic membrane and external ear normal.     Left Ear: Tympanic membrane and external ear normal.     Nose: Rhinorrhea present.     Mouth/Throat:     Mouth: Mucous membranes are moist.     Pharynx: Posterior oropharyngeal erythema present.  Eyes:     Extraocular Movements: Extraocular movements intact.     Conjunctiva/sclera: Conjunctivae normal.  Cardiovascular:     Rate and Rhythm: Normal rate and regular rhythm.     Heart sounds: Normal heart sounds.  Pulmonary:     Effort: Pulmonary effort is normal.     Breath sounds: Normal breath sounds. No wheezing or rales.  Musculoskeletal:        General: Normal range of motion.     Cervical back: Normal range of motion and neck supple.  Skin:    General: Skin is warm and dry.  Neurological:     Mental Status: She is alert and oriented to person, place, and time.  Psychiatric:        Mood and Affect: Mood normal.        Thought Content: Thought content normal.   UC Treatments / Results  Labs (all labs ordered are listed, but only abnormal results are displayed) Labs Reviewed  COVID-19, FLU A+B NAA    EKG   Radiology No results found.  Procedures Procedures (including critical care time)  Medications Ordered in UC Medications - No data to display  Initial Impression / Assessment and Plan / UC Course  I have reviewed the triage vital signs and the nursing notes.  Pertinent labs & imaging results that were available during my care of the patient were reviewed by me and considered in my medical decision making (see chart for details).     Vital signs reassuring, suspect viral upper respiratory infection.  COVID and flu testing pending.  Treat symptoms with prednisone, Phenergan DM and supportive counter medications and home care.  Continue allergy regimen.  Return for acutely worsening symptoms. Final Clinical Impressions(s) / UC Diagnoses   Final diagnoses:  Encounter for screening for  COVID-19  Viral URI with cough  Seasonal allergic rhinitis due to other allergic trigger   Discharge Instructions   None    ED Prescriptions     Medication Sig Dispense Auth. Provider   predniSONE (DELTASONE) 20 MG tablet Take 2 tablets (40 mg total) by mouth daily with breakfast. 10 tablet [name], PA-C   promethazine-dextromethorphan (PROMETHAZINE-DM) 6.25-15 MG/5ML syrup Take 5 mLs by mouth 4 (four) times daily as needed. 100 mL [name], [name]      PDMP not reviewed this encounter.   [name], [name] 10/06/21 1113

## 2021-10-06 NOTE — ED Triage Notes (Signed)
Pt reports cough, runny nose, intermittent sore throat,chills since Thursday.  ?

## 2021-10-07 LAB — COVID-19, FLU A+B NAA
Influenza A, NAA: NOT DETECTED
Influenza B, NAA: NOT DETECTED
SARS-CoV-2, NAA: NOT DETECTED

## 2021-11-01 ENCOUNTER — Other Ambulatory Visit: Payer: Self-pay | Admitting: *Deleted

## 2021-11-01 NOTE — Patient Outreach (Signed)
Care Coordination ? ?11/01/2021 ? ?Iceis AHONESTY WOODFIN ?[birth year] ?[phone] ? ?Successful initial telephone outreach, purpose of Medicaid Managed - High Risk Care Management /Care Coordination Program discussed and roles of Managed Medicaid team members defined.  ?Patient states she is interested in participating in the program but does not have time to complete the initial telephone assessment today. She is requesting to reschedule the assessment to next week.  ? ?Plan: ?With patient's agreement, initial telephone assessment rescheduled to 11/08/21 at 11:00 am. ? ?Cranford Mon RN, CCM, CDCES ?[place]  [facility] ?Care Management Coordinator - Managed Medicaid High Risk ?[phone]  ?

## 2021-11-08 ENCOUNTER — Other Ambulatory Visit: Payer: Self-pay | Admitting: *Deleted

## 2021-11-08 ENCOUNTER — Encounter: Payer: Self-pay | Admitting: *Deleted

## 2021-11-08 DIAGNOSIS — E0800 Diabetes mellitus due to underlying condition with hyperosmolarity without nonketotic hyperglycemic-hyperosmolar coma (NKHHC): Secondary | ICD-10-CM

## 2021-11-08 NOTE — Patient Outreach (Addendum)
?Medicaid Managed Care   ?Nurse Care Manager Note ? ?11/08/2021 ?Name:  April Carney MRN:  [id number] DOB:  11-Jul-1981 ? ?April Carney is an 41 y.o. year old female who is a primary patient of Kotturi, Tyler Deis, MD.  The [facility] Managed Care Coordination team was consulted for assistance with:    ?HTN ?HLD ?DM ?Obesity ?Cerebrovascular Disease ? ?April Carney was given information about Medicaid Managed Care Coordination team services today. April Carney Patient agreed to services and verbal consent obtained. ? ?Engaged with patient by telephone for initial visit in response to provider referral for case management and/or care coordination services.  ? ?Assessments/Interventions:  Review of past medical history, allergies, medications, health status, including review of consultants reports, laboratory and other test data, was performed as part of comprehensive evaluation and provision of chronic care management services. ? ?SDOH (Social Determinants of Health) assessments and interventions performed: ?SDOH Interventions   ? ?Flowsheet Row Most Recent Value  ?SDOH Interventions   ?Food Insecurity Interventions Intervention Not Indicated  ?Financial Strain Interventions Intervention Not Indicated  ?Housing Interventions Intervention Not Indicated  ?Stress Interventions Intervention Not Indicated  ?Transportation Interventions Intervention Not Indicated  ? ?  ? ? ?Care Plan ? ?No Known Allergies ? ?Medications Reviewed Today   ? ? Reviewed by Barrington Ellison, RN (Registered Nurse) on 11/08/21 at 1149  Med List Status: <None>  ? ?Medication Order Taking? Sig Documenting Provider Last Dose Status Informant  ?Accu-Chek FastClix Lancets MISC [id number] Yes Use to check blood glucose twice a day Corum, Rex Kras, MD Taking Active Spouse/Significant Other  ?acetaminophen (TYLENOL) 325 MG tablet [id number] Yes Take 650 mg by mouth every 6 (six) hours as needed.  [provider] Taking Active  Spouse/Significant Other  ?apixaban (ELIQUIS) 5 MG TABS tablet [id number]  TAKE 2 TABLETS (10 MG TOTAL) BY MOUTH 2 (TWO) TIMES DAILY FOR 5 DAYS, THEN DECREASE TO 1 TABLET ($RemoveB'5MG'pJbURzhO$ ) TWICE DAILY  ?Patient taking differently: 5 mg 2 (two) times daily.  ? Thurnell Lose, MD  Expired 09/27/21 2359 Self  ?atorvastatin (LIPITOR) 40 MG tablet [id number] Yes Take 1 tablet (40 mg total) by mouth daily. Frann Rider, NP Taking Active   ?blood glucose meter kit and supplies KIT [id number] Yes Dispense based on patient and insurance preference. Use up to four times daily as directed. (FOR ICD-9 250.00, 250.01). Corum, Rex Kras, MD Taking Active Spouse/Significant Other  ?Dulaglutide 1.5 MG/0.5ML SOPN [id number] Yes Inject 1.5 mg into the skin once a week. [provider] Taking Active   ?         ?Med Note Broadus John, [name]   Fri Nov 08, 2021 11:44 AM) Injects Sunday  ?Efinaconazole 10 % SOLN [id number]  Apply 1 drop topically daily. Trula Slade, DPM  Active   ?etonogestrel (NEXPLANON) 68 MG IMPL implant [id number] Yes 1 each by Subdermal route once. [provider] Taking Active   ?ferrous sulfate 325 (65 FE) MG tablet [id number]  TAKE 1 TABLET (325 MG TOTAL) BY MOUTH 2 (TWO) TIMES DAILY WITH A MEAL. Thurnell Lose, MD  Expired 07/16/21 2359   ?FLUoxetine (PROZAC) 20 MG capsule [id number]  Take three pills each morning as directed.  ?Patient taking differently: Take 20 mg by mouth daily.  ? [name], MD  Active   ?glucose blood (ACCU-CHEK GUIDE) test strip [id number] Yes Use to check glucose twice a day Corum, Rex Kras, MD Taking Active Spouse/Significant Other  ?levothyroxine (SYNTHROID) 175 MCG tablet  [id number] Yes Take 175 mcg by mouth daily. [provider] Taking Active   ?lisinopril (ZESTRIL) 10 MG tablet [id number] Yes Take 10 mg by mouth daily. [provider] Taking Active   ?metFORMIN (GLUCOPHAGE) 500 MG tablet [id number] Yes TAKE 1 TABLET BY MOUTH WITH BREAKFAST AND 2 TABLETS WITH  EVENING MEAL  ?Patient taking differently: 1,000 mg 2 (two) times daily. TAKE 1 TABLET BY MOUTH WITH BREAKFAST AND 2 TABLETS WITH EVENING MEAL  ? Corum, Rex Kras, MD Taking Active Spouse/Significant Other  ?predniSONE (DELTASONE) 20 MG tablet [id number] No Take 2 tablets (40 mg total) by mouth daily with breakfast.  ?Patient not taking: Reported on 11/08/2021  ? [name], [name] Not Taking Active   ?promethazine-dextromethorphan (PROMETHAZINE-DM) 6.25-15 MG/5ML syrup [id number] No Take 5 mLs by mouth 4 (four) times daily as needed.  ?Patient not taking: Reported on 11/08/2021  ? [name], [name] Not Taking Active   ? ?  ?  ? ?  ? ? ?Patient Active Problem List  ? Diagnosis Date Noted  ? History of CVA (cerebrovascular accident) 07/19/2020  ? History of pulmonary embolus (PE) 07/19/2020  ? Iron deficiency anemia due to chronic blood loss 07/19/2020  ? COVID-19   ? 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 CVA (cerebral vascular accident) ([place]) 07/09/2020  ? Cerebrovascular accident (CVA) ([place]) 07/09/2020  ? Nexplanon insertion 05/25/2020  ? Anemia 08/22/2019  ? Submandibular sialoadenitis 07/15/2018  ? Elevated liver function tests 06/02/2017  ? Superficial fungus infection of skin 07/01/2016  ? Essential hypertension 07/01/2016  ? Diabetes mellitus ([place]) 12/12/2015  ? Hyperlipidemia 12/12/2015  ? Situational depression 12/11/2015  ? Family h/o Biliary atresia 04/24/2015  ? History of preterm delivery 04/24/2015  ? History of cesarean section 04/24/2015  ? Obesity 04/24/2015  ? Hypothyroidism 04/24/2015  ? ? ?Care Plan : RN Care Manager Plan Of Care  ?Updates made by Barrington Ellison, RN since 11/08/2021 12:00 AM  ?  ? ?Problem: Knowledge Deficit and Care Coordination Needs Related to Management of HTN, DM, HLD, cerebrovascular  disease, obesity   ?Priority: High  ?  ? ?Long-Range Goal: Development of Plan Of Care to Address Care Coordination Needs and Knowledge Deficits for Management of HTN, DM, HLD,  cerebrovascular disease, obesity   ?Start Date: 11/08/2021  ?Expected End Date: 11/09/2022  ?Note:   ?Current Barriers:  ?Knowledge Deficits related to plan of care for management of HTN, HLD, DMII, and cerebrovascular disease and obesity  ?Care Coordination needs related to Lacks knowledge of community resource: for dental and ophthalmic services with providers that accept patient's health plan - patient states she has good understanding of how to manage her diabetes, HTN and HLD and cerebrovascular disease, she did ask for dental and eye care resources as she is overdue for both an eye exam and dental care ? ?RNCM Clinical Goal(s):  ?Patient will verbalize basic understanding of HTN, HLD, DMII, and obesity and cerebrovascular disease process and self health management plan as evidenced by no hospital admissions or ED visits related to complications of chronic health issues  and meeting treatment targets for chronic health issues through collaboration with RN Care manager, provider, and care team.  ? ?Interventions: ?Inter-disciplinary care team collaboration (see longitudinal plan of care) ?Evaluation of current treatment plan related to  self management and patient's adherence to plan as established by provider ?Referral placed to community care guide to provide patient with dental and ophthalmologic resources ?Reviewed benefits of Healthy [facility] managed Medicaid  health plan related to: phone program, behavorial health, transportation, home delivered meal, lifestyle aids and Healthy Rewards Program and mailed information to patient ?Provided written information via mail on obstructive sleep apnea ? ? ?Diabetes:  (Status: New goal.) Long Term Goal  ? ?Lab Results  ?Component Value Date  ? HGBA1C 6.5 (H) 09/27/2021  ?  ?Assessed patient's understanding of A1c goal: <6.5%, reviewed A1C target ?Provided education to patient about basic DM disease process; ?Reviewed medications with patient and discussed importance of  medication adherence;        ?Counseled on importance of regular laboratory monitoring as prescribed;        ?Discussed plans with patient for ongoing care management follow up and provided patient with direct contact in

## 2021-11-08 NOTE — Patient Instructions (Addendum)
Visit Information ? ?Ms. Roberson was given information about Medicaid Managed Care team care coordination services as a part of their Healthy [facility] Medicaid benefit. Zooey Aurea Graff verbally consented to engagement with the [facility] Managed Care team.  ? ?If you are experiencing a medical emergency, please call 911 or report to your local emergency department or urgent care.  ? ?If you have a non-emergency medical problem during routine business hours, please contact your provider's office and ask to speak with a nurse.  ? ?For questions related to your Healthy [facility] health plan, please call: [phone] or visit the homepage here: [email or web address] ? ?If you would like to schedule transportation through your Healthy [facility] plan, please call the following number at least 2 days in advance of your appointment: [phone] ? For information about your ride after you set it up, call Ride Assist at [phone]. Use this number to activate a Will Call pickup, or if your transportation is late for a scheduled pickup. Use this number, too, if you need to make a change or cancel a previously scheduled reservation. ? If you need transportation services right away, call [phone]. The after-hours call center is staffed 24 hours to handle ride assistance and urgent reservation requests (including discharges) 365 days a year. Urgent trips include sick visits, hospital discharge requests and life-sustaining treatment. ? ?Call the [place] at [phone], at any time, 24 hours a day, 7 days a week. If you are in danger or need immediate medical attention call 911. ? ?If you would like help to quit smoking, call 1-800-QUIT-NOW 404-454-8789) OR Espa?ol: 1-855-D?jelo-Ya 623-570-3491) o para m?s informaci?n haga clic aqu? or Text READY to 200-400 to register via text ? ?Ms. Maione - following are the goals we discussed in your visit today:  ?  Goals Addressed   ?Take medications as prescribed   ?Attend all scheduled provider appointments ?Call pharmacy for medication refills 3-7 days in advance of running out of medications ?Perform all self care activities independently  ?Perform IADL's (shopping, preparing meals, housekeeping, managing finances) independently ?Call provider office for new concerns or questions  ?Review educational material received in mail and write down any questions to discuss on follow up call on 11/19/21 ?  ? ? ?Please see education materials related to DM, HTN, HDL, meal planning, Healthy [employer] program provided as Advertising account planner.  ? ?The patient verbalized understanding of instructions, educational materials, and care plan provided today and agreed to receive a mailed copy of patient instructions, educational materials, and care plan.  ? ?The Managed Medicaid care management team will reach out to the patient again over the next 30 days.  ? ?Kelli Churn RN, CCM, CDCES ?[facility] ?Care Management Coordinator - Managed Medicaid High Risk ?[phone]  ? ?Following is a copy of your plan of care:  ?Care Plan : [place]  ?Updates made by Barrington Ellison, RN since 11/08/2021 12:00 AM  ?  ? ?Problem: Knowledge Deficit and Care Coordination Needs Related to Management of HTN, DM, HLD, cerebrovascular  disease, obesity   ?Priority: High  ?  ? ?Long-Range Goal: Development of Plan Of Care to Address Care Coordination Needs and Knowledge Deficits for Management of HTN, DM, HLD, cerebrovascular disease, obesity   ?Start Date: 11/08/2021  ?Expected End Date: 11/09/2022  ?Note:   ?Current Barriers:  ?Knowledge Deficits related to plan of care for management of HTN, HLD, DMII, and cerebrovascular disease and  obesity  ?Care Coordination needs related to Lacks knowledge of community resource: for dental and ophthalmic services with providers that accept patient's health plan - patient states  she has good understanding of how to manage her diabetes, HTN and HLD and cerebrovascular disease, she did ask for dental and eye care resources as she is overdue for both an eye exam and dental care ? ?RNCM Clinical Goal(s):  ?Patient will verbalize basic understanding of HTN, HLD, DMII, and obesity and cerebrovascular disease process and self health management plan as evidenced by no hospital admissions or ED visits related to complications of chronic health issues  and meeting treatment targets for chronic health issues through collaboration with RN Care manager, provider, and care team.  ? ?Interventions: ?Inter-disciplinary care team collaboration (see longitudinal plan of care) ?Evaluation of current treatment plan related to  self management and patient's adherence to plan as established by provider ?Referral placed to community care guide to provide patient with dental and ophthalmologic resources ?Reviewed benefits of Healthy [employer] managed Medicaid health plan related to: phone program, behavorial health, transportation, home delivered meal, lifestyle aids and Healthy [employer] Program and mailed information to patient ?Provided written information via mail on obstructive sleep apnea ? ? ?Diabetes:  (Status: New goal.) Long Term Goal  ? ?Lab Results  ?Component Value Date  ? HGBA1C 6.5 (H) 09/27/2021  ?  ?Assessed patient's understanding of A1c goal: <6.5%, reviewed A1C target ?Provided education to patient about basic DM disease process; ?Reviewed medications with patient and discussed importance of medication adherence;        ?Counseled on importance of regular laboratory monitoring as prescribed;        ?Discussed plans with patient for ongoing care management follow up and provided patient with direct contact information for care management team;      ?Assessed frequency and values of self monitored CBGs and reviewed targets for pre ane post meal ? ?Hypertension: (Status: New goal.) Long Term Goal  ?Last  practice recorded BP readings:  ?BP Readings from Last 3 Encounters:  ?10/06/21 124/87  ?09/27/21 125/86  ?09/17/21 (!) 150/97  ?Most recent eGFR/CrCl: No results found for: EGFR  No components found for: CRCL ? ?Evaluation of current treatment plan related to hypertension self management and patient's adherence to plan as established by provider;   ?Provided education to patient re: stroke prevention, s/s of heart attack and stroke; ?Reviewed medications with patient and discussed importance of compliance;  ?Discussed plans with patient for ongoing care management follow up and provided patient with direct contact information for care management team; ?Advised patient she may obtain an upper arm home BP monitor by asking her primary care provider to fax a Rx to [place] ? ? ?Stroke:  (Status:New goal.) Long Term Goal ?Reviewed Importance of taking all medications as prescribed ?Reviewed Importance of attending all scheduled provider appointments  ?Provided written education to patient on stroke prevention ? ? ? ?Weight Loss Interventions:  (Status:  New goal.) Long Term Goal ?Wt Readings from Last 3 Encounters:  ?10/06/21 275 lb (124.7 kg)  ?09/27/21 276 lb (125.2 kg)  ?07/06/21 275 lb (124.7 kg)  ?Weight management to be discussed on follow up call on 11/19/21   ? ? ? ?Hyperlipidemia Interventions:  (Status:  New goal.) Long Term Goal ?Lab Results  ?Component Value Date  ? CHOL 150 09/27/2021  ? HDL 29 (L) 09/27/2021  ? [place] 84 09/27/2021  ? TRIG 216 (H) 09/27/2021  ? CHOLHDL 5.2 (H) 09/27/2021  ?  ?  Medication review performed; medication list updated in electronic medical record.  ?Provided HLD educational materials ?Will discuss in more detail on follow up call on 11/19/21   ? ?Patient Goals/Self-Care Activities: ?Take medications as prescribed   ?Attend all scheduled provider appointments ?Call pharmacy for medication refills 3-7 days in advance of running out of medications ?Perform all self care  activities independently  ?Perform IADL's (shopping, preparing meals, housekeeping, managing finances) independently ?Call provider office for new concerns or questions  ?Review educational material rece

## 2021-11-08 NOTE — Addendum Note (Signed)
Addended by: Bary Richard on: 11/08/2021 02:30 PM ? ? Modules accepted: Orders ? ?

## 2021-11-11 ENCOUNTER — Telehealth: Payer: Self-pay

## 2021-11-11 NOTE — Telephone Encounter (Signed)
? ?  Telephone encounter was:  Successful.  ?11/11/2021 ?Name: April Carney MRN: [id number] DOB: 1980/10/31 ? ?April Carney is a 41 y.o. year old female who is a primary care patient of Kotturi, Zadie Rhine, MD . The community resource team was consulted for assistance with  dental ? ?Care guide performed the following interventions: Patient provided with information about care guide support team and interviewed to confirm resource needs. ? ?Follow Up Plan:  Care guide will follow up with patient by phone over the next day ? ? ? ?April Carney ?Care Guide, Embedded Care Coordination ?April Carney, Care Management  ?[phone] ?[address] [place], [place], [place] [address] ?Phone: [phone] ?Email: [email or web address] ? ?  ?

## 2021-11-13 ENCOUNTER — Telehealth: Payer: Self-pay

## 2021-11-13 NOTE — Telephone Encounter (Signed)
? ?  Telephone encounter was:  Successful.  ?11/13/2021 ?Name: April Carney MRN: [id number] DOB: [birth year] ? ?[name] is a 41 y.o. year old female who is a primary care patient of Kotturi, Zadie Rhine, MD . The community resource team was consulted for assistance with  dental ? ?Care guide performed the following interventions: Patient provided with information about care guide support team and interviewed to confirm resource needs. follow up for mailed resources patient did receive the resources ? ? ?Follow Up Plan:  No further follow up planned at this time. The patient has been provided with needed resources. ? ? ? ?Lenard Forth ?Care Guide, Embedded Care Coordination ?[facility], Care Management  ?[phone] ?[address] [place], [place], [place] [address] ?Phone: [phone] ?Email: [email or web address] ? ?  ?

## 2021-11-19 ENCOUNTER — Other Ambulatory Visit: Payer: Self-pay | Admitting: *Deleted

## 2021-11-19 NOTE — Patient Instructions (Signed)
Visit Information ? ?April Carney was given information about Medicaid Managed Care team care coordination services as a part of their Healthy [facility] Medicaid benefit. April Carney verbally consented to engagement with the [facility] Managed Care team.  ? ?If you are experiencing a medical emergency, please call 911 or report to your local emergency department or urgent care.  ? ?If you have a non-emergency medical problem during routine business hours, please contact your provider's office and ask to speak with a nurse.  ? ?For questions related to your Healthy [facility] health plan, please call: [phone] or visit the homepage here: [email or web address] ? ?If you would like to schedule transportation through your Healthy [facility] plan, please call the following number at least 2 days in advance of your appointment: [phone] ? For information about your ride after you set it up, call Ride Assist at [phone]. Use this number to activate a Will Call pickup, or if your transportation is late for a scheduled pickup. Use this number, too, if you need to make a change or cancel a previously scheduled reservation. ? If you need transportation services right away, call [phone]. The after-hours call center is staffed 24 hours to handle ride assistance and urgent reservation requests (including discharges) 365 days a year. Urgent trips include sick visits, hospital discharge requests and life-sustaining treatment. ? ?Call the [place] at [phone], at any time, 24 hours a day, 7 days a week. If you are in danger or need immediate medical attention call 911. ? ?If you would like help to quit smoking, call 1-800-QUIT-NOW (205) 277-9331) OR Espa?ol: 1-855-D?jelo-Ya 551-251-6801) o para m?s informaci?n haga clic aqu? or Text READY to 200-400 to register via text ? ?April Carney - following are the goals we discussed in your visit today:  ?  Goals Addressed   ? ?Take medications as prescribed   ?Attend all scheduled provider appointments ?Call pharmacy for medication refills 3-7 days in advance of running out of medications ?Perform all self care activities independently  ?Perform IADL's (shopping, preparing meals, housekeeping, managing finances) independently ?Call provider office for new concerns or questions  ?Review educational material received in mail and write down any questions to discuss on follow up call on 12/25/21 ? ? ? ?Patient verbalizes understanding of instructions and care plan provided today and agrees to view in [place]. Active MyChart status confirmed with patient.   ? ?The Managed Medicaid care management team will reach out to the patient again over the next 30-45 days.  ? ?Kelli Churn RN, CCM, CDCES ?[facility] ?Care Management Coordinator - Managed Medicaid High Risk ?[phone]  ? ?Following is a copy of your plan of care:  ?Care Plan : [place]  ?Updates made by Barrington Ellison, RN since 11/19/2021 12:00 AM  ?  ? ?Problem: Knowledge Deficit and Care Coordination Needs Related to Management of HTN, DM, HLD, cerebrovascular  disease, obesity   ?Priority: High  ?  ? ?Long-Range Goal: Development of Plan Of Care to Address Care Coordination Needs and Knowledge Deficits for Management of HTN, DM, HLD, cerebrovascular disease, obesity   ?Start Date: 11/08/2021  ?Expected End Date: 11/09/2022  ?Note:   ?Current Barriers:  ?Knowledge Deficits related to plan of care for management of HTN, HLD, DMII, and cerebrovascular disease and obesity  ?Care Coordination needs related to Lacks knowledge of community resource: for dental and ophthalmic services with providers that accept patient's health plan - patient  states she has good understanding of how to manage her diabetes, HTN and HLD and cerebrovascular disease, she did ask for dental and eye care resources as she is overdue for both an eye  exam and dental care ?Patient states she received the educational materials mailed to her earlier this month but has not had time to review the information. ?Say she did receive dental and eye care resources from the community care guide.  ?RNCM Clinical Goal(s):  ?Patient will verbalize basic understanding of HTN, HLD, DMII, and obesity and cerebrovascular disease process and self health management plan as evidenced by no hospital admissions or ED visits related to complications of chronic health issues  and meeting treatment targets for chronic health issues through collaboration with RN Care manager, provider, and care team.  ? ?Interventions: ?Inter-disciplinary care team collaboration (see longitudinal plan of care) ?Evaluation of current treatment plan related to  self management and patient's adherence to plan as established by provider ?Ensured patient received dental and ophthalmologic resources from a community care guide.  ?Ensured patient received written information via mail on obstructive sleep apnea and Healthy [employer] Program and HLD , ADA DM booklet and planning healthy meals brochure ? ? ?Diabetes:  (Status: Goal on Track (progressing): YES.) Long Term Goal - patient reports majority of home monitored CBGs are meeting treatment targets ? ?Lab Results  ?Component Value Date  ? HGBA1C 6.5 (H) 09/27/2021  ?  ?Assessed patient's understanding of A1c goal: <6.5%, reviewed A1C target ?Provided education to patient about basic DM disease process; ?Reviewed medications with patient and discussed importance of medication adherence;        ?Counseled on importance of regular laboratory monitoring as prescribed;        ?Discussed plans with patient for ongoing care management follow up and provided patient with direct contact information for care management team;      ?Assessed frequency and values of self monitored CBGs and reviewed targets for pre and post meal ?Ensured patient received American  Diabetes Association Booklet on managing diabetes and Planning Healthy Meals brochure that was mailed to her ? ?Hypertension: (Status: Goal on Track (progressing): YES.) Long Term Goal  ?Last practice recorded BP readings:  ?BP Readings from Last 3 Encounters:  ?10/06/21 124/87  ?09/27/21 125/86  ?09/17/21 (!) 150/97  ?Most recent eGFR/CrCl: No results found for: EGFR  No components found for: CRCL ? ?Evaluation of current treatment plan related to hypertension self management and patient's adherence to plan as established by provider;   ?Reviewed medications with patient and discussed importance of compliance;  ?Discussed plans with patient for ongoing care management follow up and provided patient with direct contact information for care management team; ?Reminded patient she may obtain an upper arm home BP monitor to replace her wrist monitor by asking her primary care provider to fax a Rx to [place] ?Ensured patient received written health information on HTN that was mailed to her ? ? ?Stroke:  (Status:Goal on track:  Yes.) Long Term Goal- patient denies new neurological symptoms, reports good medication taking behavior  ?Reviewed Importance of taking all medications as prescribed ?Reviewed Importance of attending all scheduled provider appointments  ?Ensured patient received  written education via mail on stroke prevention ? ? ?Weight Loss Interventions:  (Status:  New goal.) Long Term Goal ?Wt Readings from Last 3 Encounters:  ?10/06/21 275 lb (124.7 kg)  ?09/27/21 276 lb (125.2 kg)  ?07/06/21 275 lb (124.7 kg)  ?Provided patient and/or caregiver with  contact information about weight watchers program offered through her [place] Medicaid health plan (community resource or dietician)  ?Asked permission to discuss weight management with patient then provided health plan weight management information and instructed patient on steps to take to get enrolled in the  program  if she is  interested ? ? ? ?Hyperlipidemia Interventions:  (Status:  New goal.) Long Term Goal ?Lab Results  ?Component Value Date  ? CHOL 150 09/27/2021  ? HDL 29 (L) 09/27/2021  ? [place] 84 09/27/2021  ? TRIG 216 (H) 03/0

## 2021-11-19 NOTE — Patient Outreach (Addendum)
?Medicaid Managed Care   ?Nurse Care Manager Note ? ?11/19/2021 ?Name:  April Carney MRN:  [id number] DOB:  Jul 15, 1981 ? ?April Carney is an 41 y.o. year old female who is a primary patient of Kotturi, Tyler Deis, MD.  The [facility] Managed Care Coordination team was consulted for assistance with:    ?HTN ?HLD ?DM ?Depression ?Obesity ?Cerebrovascular disease ? ?[name] was given information about Medicaid Managed Care Coordination team services today. April Carney Patient agreed to services and verbal consent obtained. ? ?Engaged with patient by telephone for follow up visit in response to provider referral for case management and/or care coordination services.  ? ?Assessments/Interventions:  Review of past medical history, allergies, medications, health status, including review of consultants reports, laboratory and other test data, was performed as part of comprehensive evaluation and provision of chronic care management services. ? ?SDOH (Social Determinants of Health) assessments and interventions performed: ?SDOH Interventions   ? ?Flowsheet Row Most Recent Value  ?SDOH Interventions   ?Intimate Partner Violence Interventions Intervention Not Indicated  ? ?  ? ? ?Care Plan ? ?No Known Allergies ? ?Medications Reviewed Today   ? ? Reviewed by Barrington Ellison, RN (Registered Nurse) on 11/19/21 at 1133  Med List Status: <None>  ? ?Medication Order Taking? Sig Documenting Provider Last Dose Status Informant  ?Accu-Chek FastClix Lancets MISC [id number] No Use to check blood glucose twice a day Corum, Rex Kras, MD Taking Active Spouse/Significant Other  ?acetaminophen (TYLENOL) 325 MG tablet [id number] No Take 650 mg by mouth every 6 (six) hours as needed.  [provider] Taking Active Spouse/Significant Other  ?apixaban (ELIQUIS) 5 MG TABS tablet [id number] No TAKE 2 TABLETS (10 MG TOTAL) BY MOUTH 2 (TWO) TIMES DAILY FOR 5 DAYS, THEN DECREASE TO 1 TABLET ($RemoveB'5MG'NTVWeLEU$ ) TWICE DAILY  ?Patient taking  differently: 5 mg 2 (two) times daily.  ? Thurnell Lose, MD Taking Expired 09/27/21 2359 Self  ?Ascorbic Acid (VITAMIN C) 100 MG tablet [id number] No Take 100 mg by mouth daily. [provider] Taking Active Self  ?         ?Med Note April Carney, Maaz Spiering S   Fri Nov 08, 2021 12:40 PM) Pt is not sure of dose  ?atorvastatin (LIPITOR) 40 MG tablet [id number] No Take 1 tablet (40 mg total) by mouth daily. Frann Rider, NP Taking Active   ?blood glucose meter kit and supplies KIT [id number] No Dispense based on patient and insurance preference. Use up to four times daily as directed. (FOR ICD-9 250.00, 250.01). Corum, Rex Kras, MD Taking Active Spouse/Significant Other  ?Dulaglutide 1.5 MG/0.5ML SOPN [id number] No Inject 1.5 mg into the skin once a week. [provider] Taking Active   ?         ?Med Note April Carney, April Carney   Fri Nov 08, 2021 11:44 AM) Injects Sunday  ?Efinaconazole 10 % SOLN [id number] No Apply 1 drop topically daily. Trula Slade, DPM Taking Active   ?etonogestrel (NEXPLANON) 68 MG IMPL implant [id number] No 1 each by Subdermal route once. [provider] Taking Active   ?ferrous sulfate 325 (65 FE) MG tablet [id number] No TAKE 1 TABLET (325 MG TOTAL) BY MOUTH 2 (TWO) TIMES DAILY WITH A MEAL. Thurnell Lose, MD Taking Expired 07/16/21 2359   ?         ?Med Note April Carney, April Carney   Fri Nov 08, 2021 12:43 PM) Patient is not taking iron tablet  ?FLUoxetine (PROZAC) 20 MG  capsule [id number] No Take three pills each morning as directed.  ?Patient taking differently: Take 20 mg by mouth daily.  ? [name], MD Taking Active   ?glucose blood (ACCU-CHEK GUIDE) test strip [id number] No Use to check glucose twice a day Corum, Rex Kras, MD Taking Active Spouse/Significant Other  ?levothyroxine (SYNTHROID) 175 MCG tablet [id number] No Take 175 mcg by mouth daily. [provider] Taking Active   ?lisinopril (ZESTRIL) 10 MG tablet [id number] No Take 10 mg by mouth daily. [provider] Taking Active   ?metFORMIN (GLUCOPHAGE) 500 MG tablet [id number] No TAKE 1 TABLET BY MOUTH WITH BREAKFAST AND 2 TABLETS WITH EVENING MEAL  ?Patient taking differently: 1,000 mg 2 (two) times daily. TAKE 1 TABLET BY MOUTH WITH BREAKFAST AND 2 TABLETS WITH EVENING MEAL  ? Corum, Rex Kras, MD Taking Active Spouse/Significant Other  ?predniSONE (DELTASONE) 20 MG tablet [id number] No Take 2 tablets (40 mg total) by mouth daily with breakfast.  ?Patient not taking: Reported on 11/08/2021  ? [name], [name] Not Taking Active   ?promethazine-dextromethorphan (PROMETHAZINE-DM) 6.25-15 MG/5ML syrup [id number] No Take 5 mLs by mouth 4 (four) times daily as needed.  ?Patient not taking: Reported on 11/08/2021  ? [name], [name] Not Taking Active   ?zinc gluconate 50 MG tablet [id number] No Take 50 mg by mouth daily. [provider] Taking Active Self  ?         ?Med Note April Carney, April Carney   Fri Nov 08, 2021 12:42 PM) Patient is not sure of dose  ? ?  ?  ? ?  ? ? ?Patient Active Problem List  ? Diagnosis Date Noted  ? History of CVA (cerebrovascular accident) 07/19/2020  ? History of pulmonary embolus (PE) 07/19/2020  ? Iron deficiency anemia due to chronic blood loss 07/19/2020  ? COVID-19   ? Stroke (cerebrum) ([place]) 07/09/2020  ? CVA (cerebral vascular accident) ([place]) 07/09/2020  ? Cerebrovascular accident (CVA) ([place]) 07/09/2020  ? Nexplanon insertion 05/25/2020  ? Anemia 08/22/2019  ? Submandibular sialoadenitis 07/15/2018  ? Elevated liver function tests 06/02/2017  ? Superficial fungus infection of skin 07/01/2016  ? Essential hypertension 07/01/2016  ? Diabetes mellitus ([place]) 12/12/2015  ? Hyperlipidemia 12/12/2015  ? Situational depression 12/11/2015  ? Family h/o Biliary atresia 04/24/2015  ? History of preterm delivery 04/24/2015  ? History of cesarean section 04/24/2015  ? Obesity 04/24/2015  ? Hypothyroidism 04/24/2015  ? ? ? ?Care Plan : RN Care Manager Plan Of Care  ?Updates  made by Barrington Ellison, RN since 11/19/2021 12:00 AM  ?  ? ?Problem: Knowledge Deficit and Care Coordination Needs Related to Management of HTN, DM, HLD, cerebrovascular  disease, obesity   ?Priority: High  ?  ? ?Long-Range Goal: Development of Plan Of Care to Address Care Coordination Needs and Knowledge Deficits for Management of HTN, DM, HLD, cerebrovascular disease, obesity   ?Start Date: 11/08/2021  ?Expected End Date: 11/09/2022  ?Note:   ?Current Barriers:  ?Knowledge Deficits related to plan of care for management of HTN, HLD, DMII, and cerebrovascular disease and obesity  ?Care Coordination needs related to Lacks knowledge of community resource: for dental and ophthalmic services with providers that accept patient's health plan - patient states she has good understanding of how to manage her diabetes, HTN and HLD and cerebrovascular disease, she did ask for dental and eye care resources as she is overdue for both an eye exam and dental care ?Patient states she received  the educational materials mailed to her earlier this month but has not had time to review the information. ?Say she did receive dental and eye care resources from the community care guide.  ?RNCM Clinical Goal(s):  ?Patient will verbalize basic understanding of HTN, HLD, DMII, and obesity and cerebrovascular disease process and self health management plan as evidenced by no hospital admissions or ED visits related to complications of chronic health issues  and meeting treatment targets for chronic health issues through collaboration with RN Care manager, provider, and care team.  ? ?Interventions: ?Inter-disciplinary care team collaboration (see longitudinal plan of care) ?Evaluation of current treatment plan related to  self management and patient's adherence to plan as established by provider ?Ensured patient received dental and ophthalmologic resources from a community care guide.  ?Ensured patient received written information via mail on  obstructive sleep apnea and Healthy [employer] and HLD , ADA DM booklet and planning healthy meals brochure ?Advised patient role of RNCM will transition to [name] in May 2023 and follow up teleph

## 2021-11-29 ENCOUNTER — Emergency Department (HOSPITAL_COMMUNITY)
Admission: EM | Admit: 2021-11-29 | Discharge: 2021-11-29 | Disposition: A | Discharge status: Home / Self Care | Payer: Medicaid Other | Attending: Student | Admitting: Student

## 2021-11-29 ENCOUNTER — Encounter (HOSPITAL_COMMUNITY): Payer: Self-pay

## 2021-11-29 ENCOUNTER — Other Ambulatory Visit: Payer: Self-pay

## 2021-11-29 ENCOUNTER — Emergency Department (HOSPITAL_COMMUNITY): Payer: Medicaid Other

## 2021-11-29 DIAGNOSIS — M79672 Pain in left foot: Secondary | ICD-10-CM | POA: Diagnosis not present

## 2021-11-29 DIAGNOSIS — Z79899 Other long term (current) drug therapy: Secondary | ICD-10-CM | POA: Diagnosis not present

## 2021-11-29 DIAGNOSIS — I10 Essential (primary) hypertension: Secondary | ICD-10-CM | POA: Diagnosis not present

## 2021-11-29 DIAGNOSIS — Z8616 Personal history of COVID-19: Secondary | ICD-10-CM | POA: Insufficient documentation

## 2021-11-29 DIAGNOSIS — Z7901 Long term (current) use of anticoagulants: Secondary | ICD-10-CM | POA: Insufficient documentation

## 2021-11-29 DIAGNOSIS — E039 Hypothyroidism, unspecified: Secondary | ICD-10-CM | POA: Insufficient documentation

## 2021-11-29 DIAGNOSIS — Z7984 Long term (current) use of oral hypoglycemic drugs: Secondary | ICD-10-CM | POA: Insufficient documentation

## 2021-11-29 DIAGNOSIS — E119 Type 2 diabetes mellitus without complications: Secondary | ICD-10-CM | POA: Diagnosis not present

## 2021-11-29 MED ORDER — DICLOFENAC SODIUM 1 % EX GEL: 2.0000 g | Freq: Four times a day (QID) | CUTANEOUS | 0 refills | Status: DC

## 2021-11-29 MED ORDER — LIDOCAINE 5 % EX PTCH
1.0000 | MEDICATED_PATCH | CUTANEOUS | Status: DC
  Administered 2021-11-29: 1 via TRANSDERMAL
  Filled 2021-11-29: qty 1

## 2021-11-29 NOTE — ED Provider Notes (Signed)
?[place] EMERGENCY DEPARTMENT ?Provider Note ? ?CSN: [id number] ?Arrival date & time: 11/29/21 0721 ? ?Chief Complaint(s) ?Foot Pain ? ?HPI ?April Carney is a 41 y.o. female with PMH T2DM, previous CVA on Eliquis, known left foot bunion who presents emergency department for evaluation of left foot pain.  Patient states that she saw her podiatrist recently and was diagnosed with a bunion but states that over the last few days she has had worsening pain to the medial aspect of the left foot.  No new trauma to the foot.  No numbness, tingling, weakness or wounds seen on the foot.  Denies fever, chest pain, shortness of breath, Donnell pain, nausea, vomiting or other systemic symptoms. ? ? ?Past Medical History ?Past Medical History:  ?Diagnosis Date  ? Depression   ? Diabetes mellitus without complication (HCC)   ? Hyperlipidemia   ? Hypertension   ? Hypoactive thyroid   ? Hypothyroid   ? Mental disorder   ? depression and PTSD  ? Stroke (HCC) 07/09/2020  ? ?Patient Active Problem List  ? Diagnosis Date Noted  ? History of CVA (cerebrovascular accident) 07/19/2020  ? History of pulmonary embolus (PE) 07/19/2020  ? Iron deficiency anemia due to chronic blood loss 07/19/2020  ? COVID-19   ? Stroke (cerebrum) (HCC) 07/09/2020  ? CVA (cerebral vascular accident) (HCC) 07/09/2020  ? Cerebrovascular accident (CVA) (HCC) 07/09/2020  ? Nexplanon insertion 05/25/2020  ? Anemia 08/22/2019  ? Submandibular sialoadenitis 07/15/2018  ? Elevated liver function tests 06/02/2017  ? Superficial fungus infection of skin 07/01/2016  ? Essential hypertension 07/01/2016  ? Diabetes mellitus (HCC) 12/12/2015  ? Hyperlipidemia 12/12/2015  ? Situational depression 12/11/2015  ? Family h/o Biliary atresia 04/24/2015  ? History of preterm delivery 04/24/2015  ? History of cesarean section 04/24/2015  ? Obesity 04/24/2015  ? Hypothyroidism 04/24/2015  ? ?Home Medication(s) ?Prior to Admission medications   ?Medication Sig Start Date End  Date Taking? Authorizing Provider  ?Accu-Chek FastClix Lancets MISC Use to check blood glucose twice a day 01/02/20   Corum, Lisa L, MD  ?acetaminophen (TYLENOL) 325 MG tablet Take 650 mg by mouth every 6 (six) hours as needed.     [provider]  ?apixaban (ELIQUIS) 5 MG TABS tablet TAKE 2 TABLETS (10 MG TOTAL) BY MOUTH 2 (TWO) TIMES DAILY FOR 5 DAYS, THEN DECREASE TO 1 TABLET (5MG) TWICE DAILY ?Patient taking differently: 5 mg 2 (two) times daily. 07/16/20 09/27/21  Singh, Prashant K, MD  ?Ascorbic Acid (VITAMIN C) 100 MG tablet Take 100 mg by mouth daily.    [provider]  ?atorvastatin (LIPITOR) 40 MG tablet Take 1 tablet (40 mg total) by mouth daily. 09/30/21   McCue, Jessica, NP  ?blood glucose meter kit and supplies KIT Dispense based on patient and insurance preference. Use up to four times daily as directed. (FOR ICD-9 250.00, 250.01). 05/31/19   Corum, Lisa L, MD  ?Dulaglutide 1.5 MG/0.5ML SOPN Inject 1.5 mg into the skin once a week.    [provider]  ?Efinaconazole 10 % SOLN Apply 1 drop topically daily. 09/17/21   Wagoner, Matthew R, DPM  ?etonogestrel (NEXPLANON) 68 MG IMPL implant 1 each by Subdermal route once.    [provider]  ?ferrous sulfate 325 (65 FE) MG tablet TAKE 1 TABLET (325 MG TOTAL) BY MOUTH 2 (TWO) TIMES DAILY WITH A MEAL. 07/16/20 07/16/21  Singh, Prashant K, MD  ?FLUoxetine (PROZAC) 20 MG capsule Take three pills each morning as   directed. ?Patient taking differently: Take 20 mg by mouth daily. 04/14/18   [name], MD  ?glucose blood (ACCU-CHEK GUIDE) test strip Use to check glucose twice a day 01/02/20   Maryruth Hancock, MD  ?levothyroxine (SYNTHROID) 175 MCG tablet Take 175 mcg by mouth daily.    [provider]  ?lisinopril (ZESTRIL) 10 MG tablet Take 10 mg by mouth daily.    [provider]  ?metFORMIN (GLUCOPHAGE) 500 MG tablet TAKE 1 TABLET BY MOUTH WITH BREAKFAST AND 2 TABLETS WITH EVENING MEAL ?Patient taking differently:  1,000 mg 2 (two) times daily. TAKE 1 TABLET BY MOUTH WITH BREAKFAST AND 2 TABLETS WITH EVENING MEAL 01/02/20   Corum, Rex Kras, MD  ?predniSONE (DELTASONE) 20 MG tablet Take 2 tablets (40 mg total) by mouth daily with breakfast. ?Patient not taking: Reported on 11/08/2021 10/06/21   [name], PA-C  ?promethazine-dextromethorphan (PROMETHAZINE-DM) 6.25-15 MG/5ML syrup Take 5 mLs by mouth 4 (four) times daily as needed. ?Patient not taking: Reported on 11/08/2021 10/06/21   [name], PA-C  ?zinc gluconate 50 MG tablet Take 50 mg by mouth daily.    [provider]  ?                                                                                                                                  ?Past Surgical History ?Past Surgical History:  ?Procedure Laterality Date  ? CESAREAN SECTION    ? ?Family History ?Family History  ?Problem Relation Age of Onset  ? Diabetes Mother   ? Hypertension Mother   ? Hyperlipidemia Mother   ? Kidney disease Mother   ? Stroke Mother   ? Diabetes Father   ? Heart disease Father   ? Liver disease Son   ?     biliary atresia   ? Asthma Son   ? ADD / ADHD Son   ? ODD Son   ? Asthma Son   ? ADD / ADHD Son   ? ODD Son   ? Cancer Paternal Aunt   ? Colon cancer Neg Hx   ? ? ?Social History ?Social History  ? ?Tobacco Use  ? Smoking status: Never  ? Smokeless tobacco: Never  ?Vaping Use  ? Vaping Use: Never used  ?Substance Use Topics  ? Alcohol use: Yes  ?  Comment: "very rarely"  ? Drug use: No  ? ?Allergies ?Patient has no known allergies. ? ?Review of Systems ?Review of Systems  ?Musculoskeletal:  Positive for arthralgias.  ? ?Physical Exam ?Vital Signs  ?I have reviewed the triage vital signs ?BP (!) 115/92 (BP Location: Right Arm)   Pulse 90   Temp 98.1 ?F (36.7 ?C) (Oral)   Resp 18   Ht 5' 5" (1.651 m)   Wt 124.7 kg   SpO2 99%   BMI 45.76 kg/m?  ? ?Physical Exam ?Vitals and nursing note reviewed.  ?Constitutional:   ?  General: She is not in acute  distress. ?   Appearance: She is well-developed.  ?HENT:  ?   Head: Normocephalic and atraumatic.  ?Eyes:  ?   Conjunctiva/sclera: Conjunctivae normal.  ?Cardiovascular:  ?   Rate and Rhythm: Normal rate and regular rhythm.  ?   Heart sounds: No murmur heard. ?Pulmonary:  ?   Effort: Pulmonary effort is normal. No respiratory distress.  ?   Breath sounds: Normal breath sounds.  ?Abdominal:  ?   Palpations: Abdomen is soft.  ?   Tenderness: There is no abdominal tenderness.  ?Musculoskeletal:     ?   General: Tenderness (MTP joint of the foot, great toe left) present. No swelling.  ?   Cervical back: Neck supple.  ?Skin: ?   General: Skin is warm and dry.  ?   Capillary Refill: Capillary refill takes less than 2 seconds.  ?Neurological:  ?   Mental Status: She is alert.  ?Psychiatric:     ?   Mood and Affect: Mood normal.  ? ? ?ED Results and Treatments ?Labs ?(all labs ordered are listed, but only abnormal results are displayed) ?Labs Reviewed - No data to display                                                                                                                       ? ?Radiology ?DG Foot Complete Left ? ?Result Date: 11/29/2021 ?CLINICAL DATA:  LEFT foot pain for a couple days, unable to bear weight, no known injury, history of bunion and calcaneal spur EXAM: LEFT FOOT - COMPLETE 3+ VIEW COMPARISON:  09/17/2021 FINDINGS: Osseous mineralization normal. Joint spaces preserved. Spurring at dorsal medial margin of tarsal navicular at the talonavicular joint. Minimal bunion deformity. No acute fracture, dislocation, or bone destruction. Small bone island distal fifth metatarsal. IMPRESSION: No acute osseous abnormalities. Electronically Signed   By: Mark  Boles M.D.   On: 11/29/2021 08:13   ? ?Pertinent labs & imaging results that were available during my care of the patient were reviewed by me and considered in my medical decision making (see MDM for details). ? ?Medications Ordered in ED ?Medications   ?lidocaine (LIDODERM) 5 % 1 patch (1 patch Transdermal Patch Applied 11/29/21 0940)  ?                                                               ?                                                                    ?  Pro

## 2021-11-29 NOTE — ED Triage Notes (Signed)
Pt presents to ED with complaints of left foot pain x couple of days.  ?

## 2021-12-02 ENCOUNTER — Ambulatory Visit: Payer: Medicaid Other | Admitting: Podiatry

## 2021-12-02 DIAGNOSIS — M7989 Other specified soft tissue disorders: Secondary | ICD-10-CM

## 2021-12-02 DIAGNOSIS — M1000 Idiopathic gout, unspecified site: Secondary | ICD-10-CM | POA: Diagnosis not present

## 2021-12-02 DIAGNOSIS — M7752 Other enthesopathy of left foot: Secondary | ICD-10-CM | POA: Diagnosis not present

## 2021-12-02 MED ORDER — TRIAMCINOLONE ACETONIDE 10 MG/ML IJ SUSP
10.0000 mg | Freq: Once | INTRAMUSCULAR | Status: AC
  Administered 2021-12-02: 10 mg

## 2021-12-03 ENCOUNTER — Encounter: Payer: Self-pay | Admitting: Podiatry

## 2021-12-03 ENCOUNTER — Other Ambulatory Visit: Payer: Self-pay | Admitting: Podiatry

## 2021-12-03 DIAGNOSIS — M1000 Idiopathic gout, unspecified site: Secondary | ICD-10-CM

## 2021-12-03 LAB — BASIC METABOLIC PANEL
BUN: 9 mg/dL (ref 7–25)
CO2: 25 mmol/L (ref 20–32)
Calcium: 9.7 mg/dL (ref 8.6–10.2)
Chloride: 103 mmol/L (ref 98–110)
Creat: 0.71 mg/dL (ref 0.50–0.97)
Glucose, Bld: 104 mg/dL — ABNORMAL HIGH (ref 65–99)
Potassium: 4.2 mmol/L (ref 3.5–5.3)
Sodium: 138 mmol/L (ref 135–146)

## 2021-12-03 LAB — URIC ACID: Uric Acid, Serum: 8.7 mg/dL — ABNORMAL HIGH (ref 2.5–7.0)

## 2021-12-03 MED ORDER — ALLOPURINOL 100 MG PO TABS: 100.0000 mg | ORAL_TABLET | Freq: Every day | ORAL | 1 refills | Status: DC

## 2021-12-04 NOTE — Progress Notes (Signed)
Subjective: ?41 year old female presents the office today for concerns of pain along the area of a bunion on the left foot and she wants to consider surgery.  She actually had to the emergency room given the pain.  She states that she has not had gout before.  No recent injuries that she reports is no new concerns otherwise. ? ?Objective: ?AAO x3, NAD ?DP/PT pulses palpable bilaterally, CRT less than 3 seconds ?Moderate bunions present on the left foot.  There is localized edema and erythema on the first MPJ.  Does not appear to just be localized in the first metatarsal head along the joint itself.  There is no open lesions.  Tenderness with MPJ range of motion.  No pain with calf compression, swelling, warmth, erythema ? ?Assessment: ?Capsulitis, concern for gout left foot ? ?Plan: ?-All treatment options discussed with the patient including all alternatives, risks, complications.  ?-Reviewed the x-rays in the emergency department.  No acute osseous abnormalities present. ?-Steroid injection performed.  Skin was cleaned with Betadine, alcohol and mixture of 1 cc Kenalog 10, 0.5 cc of Marcaine plain, 0.5 cc lidocaine plain was infiltrated without complications.  Postinjection care discussed. ?-Continue topical anti-inflammatories as well. ?-We will check uric acid level ?-Patient encouraged to call the office with any questions, concerns, change in symptoms.  ? ?Trula Slade DPM ? ?

## 2021-12-09 ENCOUNTER — Ambulatory Visit: Payer: Medicaid Other | Admitting: Podiatry

## 2021-12-25 ENCOUNTER — Other Ambulatory Visit: Payer: Self-pay | Admitting: Obstetrics and Gynecology

## 2021-12-25 NOTE — Patient Outreach (Signed)
Medicaid Managed Care   Nurse Care Manager Note  12/25/2021 Name:  April Carney MRN:  [id number] DOB:  15-Nov-1980  April Carney is an 41 y.o. year old female who is a primary patient of Kotturi, Tyler Deis, MD.  The Medicaid Managed Care Coordination team was consulted for assistance with:    Chronic healthcare management needs, h/o CVA, HTN, DM, HLD, depression, hypothyroid, obesity  April Carney was given information about Medicaid Managed Care Coordination team services today. April Carney Patient agreed to services and verbal consent obtained.  Engaged with patient by telephone for follow up visit in response to provider referral for case management and/or care coordination services.   Assessments/Interventions:  Review of past medical history, allergies, medications, health status, including review of consultants reports, laboratory and other test data, was performed as part of comprehensive evaluation and provision of chronic care management services.  SDOH (Social Determinants of Health) assessments and interventions performed: SDOH Interventions    Flowsheet Row Most Recent Value  SDOH Interventions   Physical Activity Interventions Intervention Not Indicated, Other (Comments)  [patient states does not feel like exercising right now]  Transportation Interventions Intervention Not Indicated     Care Plan  No Known Allergies  Medications Reviewed Today     Reviewed by [name], RN (Registered Nurse) on 12/25/21 at 1051  Med List Status: <None>   Medication Order Taking? Sig Documenting Provider Last Dose Status Informant  Accu-Chek FastClix Lancets MISC [id number] No Use to check blood glucose twice a day Corum, Rex Kras, MD Taking Active Spouse/Significant Other  acetaminophen (TYLENOL) 325 MG tablet [id number] No Take 650 mg by mouth every 6 (six) hours as needed.  [provider] Taking Active Spouse/Significant Other  allopurinol (ZYLOPRIM) 100 MG  tablet [id number]  Take 1 tablet (100 mg total) by mouth daily. Trula Slade, DPM  Active   apixaban (ELIQUIS) 5 MG TABS tablet [id number] No TAKE 2 TABLETS (10 MG TOTAL) BY MOUTH 2 (TWO) TIMES DAILY FOR 5 DAYS, THEN DECREASE TO 1 TABLET (5MG) TWICE DAILY  Patient taking differently: 5 mg 2 (two) times daily.   Thurnell Lose, MD Taking Expired 09/27/21 2359 Self  Ascorbic Acid (VITAMIN C) 100 MG tablet [id number] No Take 100 mg by mouth daily. [provider] Taking Active Self           Med Note April Carney, [name]   Fri Nov 08, 2021 12:40 PM) Pt is not sure of dose  atorvastatin (LIPITOR) 40 MG tablet [id number] No Take 1 tablet (40 mg total) by mouth daily. Frann Rider, NP Taking Active   blood glucose meter kit and supplies KIT [id number] No Dispense based on patient and insurance preference. Use up to four times daily as directed. (FOR ICD-9 250.00, 250.01). Maryruth Hancock, MD Taking Active Spouse/Significant Other  diclofenac Sodium (VOLTAREN) 1 % GEL [id number]  Apply 2 g topically 4 (four) times daily. Kommor, [place], MD  Active   Dulaglutide 1.5 MG/0.5ML [facility] Aid [id number] No Inject 1.5 mg into the skin once a week. [provider] Taking Active            Med Note April Carney, [name]   Fri Nov 08, 2021 11:44 AM) Injects Sunday  Efinaconazole 10 % SOLN [id number] No Apply 1 drop topically daily. Trula Slade, DPM Taking Active   etonogestrel (NEXPLANON) 68 MG IMPL implant [id number] No 1 each by Subdermal route once. [provider] Taking Active  ferrous sulfate 325 (65 FE) MG tablet [id number] No TAKE 1 TABLET (325 MG TOTAL) BY MOUTH 2 (TWO) TIMES DAILY WITH A MEAL. Thurnell Lose, MD Taking Expired 07/16/21 2359            Med Note April Carney, JANET S   Fri Nov 08, 2021 12:43 PM) Patient is not taking iron tablet  FLUoxetine (PROZAC) 20 MG capsule [id number] No Take three pills each morning as directed.  Patient taking differently: Take 20 mg by mouth daily.    April Macadam, MD Taking Active   glucose blood (ACCU-CHEK GUIDE) test strip [id number] No Use to check glucose twice a day Corum, Rex Kras, MD Taking Active Spouse/Significant Other  levothyroxine (SYNTHROID) 175 MCG tablet [id number] No Take 175 mcg by mouth daily. [provider] Taking Active   lisinopril (ZESTRIL) 10 MG tablet [id number] No Take 10 mg by mouth daily. [provider] Taking Active   metFORMIN (GLUCOPHAGE) 500 MG tablet [id number] No TAKE 1 TABLET BY MOUTH WITH BREAKFAST AND 2 TABLETS WITH EVENING MEAL  Patient taking differently: 1,000 mg 2 (two) times daily. TAKE 1 TABLET BY MOUTH WITH BREAKFAST AND 2 TABLETS WITH EVENING MEAL   Corum, Rex Kras, MD Taking Active Spouse/Significant Other  predniSONE (DELTASONE) 20 MG tablet [id number] No Take 2 tablets (40 mg total) by mouth daily with breakfast.  Patient not taking: Reported on 11/08/2021   [name], PA-C Not Taking Active   promethazine-dextromethorphan (PROMETHAZINE-DM) 6.25-15 MG/5ML syrup [id number] No Take 5 mLs by mouth 4 (four) times daily as needed.  Patient not taking: Reported on 11/08/2021   [name], PA-C Not Taking Active   zinc gluconate 50 MG tablet [id number] No Take 50 mg by mouth daily. [provider] Taking Active Self           Med Note April Carney, [name]   Fri Nov 08, 2021 12:42 PM) Patient is not sure of dose           Patient Active Problem List   Diagnosis Date Noted   History of CVA (cerebrovascular accident) 07/19/2020   History of pulmonary embolus (PE) 07/19/2020   Iron deficiency anemia due to chronic blood loss 07/19/2020   COVID-19    Stroke (cerebrum) ([place]) 07/09/2020   CVA (cerebral vascular accident) ([place]) 07/09/2020   Cerebrovascular accident (CVA) ([place]) 07/09/2020   Nexplanon insertion 05/25/2020   Anemia 08/22/2019   Submandibular sialoadenitis 07/15/2018   Elevated liver function tests 06/02/2017   Superficial fungus infection  of skin 07/01/2016   Essential hypertension 07/01/2016   Diabetes mellitus ([place]) 12/12/2015   Hyperlipidemia 12/12/2015   Situational depression 12/11/2015   Family h/o Biliary atresia 04/24/2015   History of preterm delivery 04/24/2015   History of cesarean section 04/24/2015   Obesity 04/24/2015   Hypothyroidism 04/24/2015   Conditions to be addressed/monitored per PCP order:  Chronic healthcare management needs, h/o CVA, HTN, DM, HLD, depression, hypothyroid, obesity  Care Plan : RN Care Manager Plan Of Care  Updates made by [name], RN since 12/25/2021 12:00 AM     Problem: Knowledge Deficit and Care Coordination Needs Related to Management of HTN, DM, HLD, cerebrovascular  disease, obesity   Priority: High     Long-Range Goal: Development of Plan Of Care to Address Care Coordination Needs and Knowledge Deficits for Management of HTN, DM, HLD, cerebrovascular disease, obesity   Start Date: 11/08/2021  Expected End Date: 11/09/2022  Note:   Current Barriers:  Knowledge Deficits related to plan of care for management of HTN, HLD, DMII, and cerebrovascular disease and obesity  Care Coordination needs related to Lacks knowledge of community resource: for dental and ophthalmic services with providers that accept patient's health plan-patient has received resources and has not decided on provider yet.  Patient is trying to fond a therapist for depression but has not been able to find one-will refer to LCSW. 12/25/21:  patient states she checks her blood pressure about once a week-last reading WNL per patient-does not remember reading.  Continues to check blood sugars 2-3 times a day-remain in 110s per patient.  Has appointment with Podiatrist tomorrow and PCP next week. RNCM Clinical Goal(s):  Patient will verbalize basic understanding of HTN, HLD, DMII, and obesity and cerebrovascular disease process and self health management plan as evidenced by no hospital admissions or ED visits  related to complications of chronic health issues  and meeting treatment targets for chronic health issues through collaboration with RN Care manager, provider, and care team.  Interventions: Inter-disciplinary care team collaboration (see longitudinal plan of care) Evaluation of current treatment plan related to  self management and patient's adherence to plan as established by provider Ensured patient received dental and ophthalmologic resources from a community care guide.  Ensured patient received written information via mail on obstructive sleep apnea and Healthy [employer] Program and HLD , ADA DM booklet and planning healthy meals brochure Collaborated with LCSW for depression and referral LCSW referral for depression and resources  Diabetes:  (Status: Goal on Track (progressing): YES.) Long Term Goal - patient reports majority of home monitored CBGs are meeting treatment targets  Lab Results  Component Value Date   HGBA1C 6.5 (H) 09/27/2021    Assessed patient's understanding of A1C goal: <6.5%, reviewed A1C target Provided education to patient about basic DM disease process; Reviewed medications with patient and discussed importance of medication adherence;        Counseled on importance of regular laboratory monitoring as prescribed;        Discussed plans with patient for ongoing care management follow up and provided patient with direct contact information for care management team;      Assessed frequency and values of self monitored CBGs and reviewed targets for pre and post meal Ensured patient received American Diabetes Association Booklet on managing diabetes and Planning Healthy Meals brochure that was mailed to her  Hypertension: (Status: Goal on Track (progressing): YES.) Long Term Goal  Last practice recorded BP readings:  BP Readings from Last 3 Encounters:  10/06/21 124/87  09/27/21 125/86  09/17/21 (!) 150/97  Most recent eGFR/CrCl: No results found for: EGFR  No  components found for: CRCL  Evaluation of current treatment plan related to hypertension self management and patient's adherence to plan as established by provider;   Reviewed medications with patient and discussed importance of compliance;  Discussed plans with patient for ongoing care management follow up and provided patient with direct contact information for care management team; Reminded patient she may obtain an upper arm home BP monitor to replace her wrist monitor by asking her primary care provider to fax a Rx to [place] Ensured patient received written health information on HTN that was mailed to her  Stroke:  (Status:Goal on track:  Yes.) Long Term Goal- patient denies new neurological symptoms, reports good medication taking behavior  Reviewed Importance of taking all medications as prescribed Reviewed Importance of attending all scheduled provider appointments  Ensured patient received  written education via mail on stroke prevention  Weight Loss Interventions:  (Status:  New goal.) Long Term Goal Wt Readings from Last 3 Encounters:  10/06/21 275 lb (124.7 kg)  09/27/21 276 lb (125.2 kg)  07/06/21 275 lb (124.7 kg)  Provided patient and/or caregiver with contact information about weight watchers program offered through her Healthy [employer] Medicaid health plan (community resource or dietician)  Asked permission to discuss weight management with patient then provided health plan weight management information and instructed patient on steps to take to get enrolled in the  program  if she is interested  Hyperlipidemia Interventions:  (Status:  New goal.) Long Term Goal Lab Results  Component Value Date   CHOL 150 09/27/2021   HDL 29 (L) 09/27/2021   LDLCALC 84 09/27/2021   TRIG 216 (H) 09/27/2021   CHOLHDL 5.2 (H) 09/27/2021    Medication review performed; medication list updated in electronic medical record.  Ensured patient received written health  information on preventing high cholesterol in the mail  Patient Goals/Self-Care Activities: Take medications as prescribed   Attend all scheduled provider appointments Call pharmacy for medication refills 3-7 days in advance of running out of medications Perform all self care activities independently  Perform IADL's (shopping, preparing meals, housekeeping, managing finances) independently Call provider office for new concerns or questions    Follow Up:  Patient agrees to Care Plan and Follow-up.  Plan: The Managed Medicaid care management team will reach out to the patient again over the next 30 days. and The  Patient has been provided with contact information for the Managed Medicaid care management team and has been advised to call with any health related questions or concerns.  Date/time of next scheduled RN care management/care coordination outreach:  01/22/22 at 0900

## 2021-12-25 NOTE — Patient Instructions (Signed)
Hi April Carney, it was great speaking with you this morning-have a wonderful day!!  April Carney was given information about Medicaid Managed Care team care coordination services as a part of their Healthy [facility] Medicaid benefit. April Carney verbally consented to engagement with the [facility] Managed Care team.   If you are experiencing a medical emergency, please call 911 or report to your local emergency department or urgent care.   If you have a non-emergency medical problem during routine business hours, please contact your provider's office and ask to speak with a nurse.   For questions related to your Healthy [facility] health plan, please call: [phone] or visit the homepage here: [email or web address]  If you would like to schedule transportation through your Healthy [facility] plan, please call the following number at least 2 days in advance of your appointment: [phone]  For information about your ride after you set it up, call Ride Assist at [phone]. Use this number to activate a Will Call pickup, or if your transportation is late for a scheduled pickup. Use this number, too, if you need to make a change or cancel a previously scheduled reservation.  If you need transportation services right away, call [phone]. The after-hours call center is staffed 24 hours to handle ride assistance and urgent reservation requests (including discharges) 365 days a year. Urgent trips include sick visits, hospital discharge requests and life-sustaining treatment.  Call the [place] at [phone], at any time, 24 hours a day, 7 days a week. If you are in danger or need immediate medical attention call 911.  If you would like help to quit smoking, call 1-800-QUIT-NOW 530-549-7079) OR Espaol: 1-855-Djelo-Ya (0-086-761-9509) o para ms informacin haga clic aqu or Text READY to 200-400 to register via text  Ms.  Alroy Carney - following are the goals we discussed in your visit today:   Goals Addressed    Timeframe:  Long-Range Goal Priority:  High Start Date:       12/25/21                      Expected End Date:    ongoing                  Follow Up Date 01/22/22    - schedule appointment for vaccines needed due to my age or health - schedule recommended health tests (blood work, mammogram, colonoscopy, pap test) - schedule and keep appointment for annual check-up    Why is this important?   Screening tests can find diseases early when they are easier to treat.  Your doctor or nurse will talk with you about which tests are important for you.  Getting shots for common diseases like the flu and shingles will help prevent them.    Patient verbalizes understanding of instructions and care plan provided today and agrees to view in [place]. Active MyChart status and patient understanding of how to access instructions and care plan via MyChart confirmed with patient.     The Managed Medicaid care management team will reach out to the patient again over the next 30 days.  The  Patient has been provided with contact information for the Managed Medicaid care management team and has been advised to call with any health related questions or concerns.   April Raider RN, BSN [place] Management Coordinator - Managed Medicaid High Risk [phone]   Following is a copy of your plan of  care:  Care Plan : RN Care Manager Plan Of Care  Updates made by [name], RN since 12/25/2021 12:00 AM     Problem: Knowledge Deficit and Care Coordination Needs Related to Management of HTN, DM, HLD, cerebrovascular  disease, obesity   Priority: High     Long-Range Goal: Development of Plan Of Care to Address Care Coordination Needs and Knowledge Deficits for Management of HTN, DM, HLD, cerebrovascular disease, obesity   Start Date: 11/08/2021  Expected End Date: 11/09/2022  Note:    Current Barriers:  Knowledge Deficits related to plan of care for management of HTN, HLD, DMII, and cerebrovascular disease and obesity  Care Coordination needs related to Lacks knowledge of community resource: for dental and ophthalmic services with providers that accept patient's health plan-patient has received resources and has not decided on provider yet.  Patient is trying to fond a therapist for depression but has not been able to find one-will refer to LCSW. 12/25/21:  patient states she checks her blood pressure about once a week-last reading WNL per patient-does not remember reading.  Continues to check blood sugars 2-3 times a day-remain in 110s per patient.  Has appointment with Podiatrist tomorrow and PCP next week. RNCM Clinical Goal(s):  Patient will verbalize basic understanding of HTN, HLD, DMII, and obesity and cerebrovascular disease process and self health management plan as evidenced by no hospital admissions or ED visits related to complications of chronic health issues  and meeting treatment targets for chronic health issues through collaboration with RN Care manager, provider, and care team.  Interventions: Inter-disciplinary care team collaboration (see longitudinal plan of care) Evaluation of current treatment plan related to  self management and patient's adherence to plan as established by provider Ensured patient received dental and ophthalmologic resources from a community care guide.  Ensured patient received written information via mail on obstructive sleep apnea and Healthy [employer] Program and HLD , ADA DM booklet and planning healthy meals brochure Collaborated with LCSW for depression and referral LCSW referral for depression and resources  Diabetes:  (Status: Goal on Track (progressing): YES.) Long Term Goal - patient reports majority of home monitored CBGs are meeting treatment targets  Lab Results  Component Value Date   HGBA1C 6.5 (H) 09/27/2021     Assessed patient's understanding of A1C goal: <6.5%, reviewed A1C target Provided education to patient about basic DM disease process; Reviewed medications with patient and discussed importance of medication adherence;        Counseled on importance of regular laboratory monitoring as prescribed;        Discussed plans with patient for ongoing care management follow up and provided patient with direct contact information for care management team;      Assessed frequency and values of self monitored CBGs and reviewed targets for pre and post meal Ensured patient received American Diabetes Association Booklet on managing diabetes and Planning Healthy Meals brochure that was mailed to her  Hypertension: (Status: Goal on Track (progressing): YES.) Long Term Goal  Last practice recorded BP readings:  BP Readings from Last 3 Encounters:  10/06/21 124/87  09/27/21 125/86  09/17/21 (!) 150/97  Most recent eGFR/CrCl: No results found for: EGFR  No components found for: CRCL  Evaluation of current treatment plan related to hypertension self management and patient's adherence to plan as established by provider;   Reviewed medications with patient and discussed importance of compliance;  Discussed plans with patient for ongoing care management follow up and  provided patient with direct contact information for care management team; Reminded patient she may obtain an upper arm home BP monitor to replace her wrist monitor by asking her primary care provider to fax a Rx to [place] Ensured patient received written health information on HTN that was mailed to her  Stroke:  (Status:Goal on track:  Yes.) Long Term Goal- patient denies new neurological symptoms, reports good medication taking behavior  Reviewed Importance of taking all medications as prescribed Reviewed Importance of attending all scheduled provider appointments  Ensured patient received  written education via mail on stroke  prevention  Weight Loss Interventions:  (Status:  New goal.) Long Term Goal Wt Readings from Last 3 Encounters:  10/06/21 275 lb (124.7 kg)  09/27/21 276 lb (125.2 kg)  07/06/21 275 lb (124.7 kg)  Provided patient and/or caregiver with contact information about weight watchers program offered through her Healthy [employer] Medicaid health plan (community resource or dietician)  Asked permission to discuss weight management with patient then provided health plan weight management information and instructed patient on steps to take to get enrolled in the  program  if she is interested   Hyperlipidemia Interventions:  (Status:  New goal.) Long Term Goal Lab Results  Component Value Date   CHOL 150 09/27/2021   HDL 29 (L) 09/27/2021   LDLCALC 84 09/27/2021   TRIG 216 (H) 09/27/2021   CHOLHDL 5.2 (H) 09/27/2021    Medication review performed; medication list updated in electronic medical record.  Ensured patient received written health information on preventing high cholesterol in the mail  Patient Goals/Self-Care Activities: Take medications as prescribed   Attend all scheduled provider appointments Call pharmacy for medication refills 3-7 days in advance of running out of medications Perform all self care activities independently  Perform IADL's (shopping, preparing meals, housekeeping, managing finances) independently Call provider office for new concerns or questions

## 2021-12-26 ENCOUNTER — Ambulatory Visit: Payer: Medicaid Other | Admitting: Podiatry

## 2021-12-26 DIAGNOSIS — Z86718 Personal history of other venous thrombosis and embolism: Secondary | ICD-10-CM

## 2021-12-26 DIAGNOSIS — M21612 Bunion of left foot: Secondary | ICD-10-CM

## 2021-12-26 DIAGNOSIS — M216X2 Other acquired deformities of left foot: Secondary | ICD-10-CM | POA: Diagnosis not present

## 2021-12-26 DIAGNOSIS — M1000 Idiopathic gout, unspecified site: Secondary | ICD-10-CM | POA: Diagnosis not present

## 2021-12-27 ENCOUNTER — Other Ambulatory Visit: Payer: Self-pay | Admitting: Licensed Clinical Social Worker

## 2021-12-27 LAB — URIC ACID: Uric Acid: 7.2 mg/dL — ABNORMAL HIGH (ref 2.6–6.2)

## 2021-12-27 NOTE — Patient Instructions (Signed)
Visit Information  April Carney was given information about Medicaid Managed Care team care coordination services as a part of their Healthy [facility] Medicaid benefit. April Carney verbally consented to engagement with the [facility] Managed Care team.   If you are experiencing a medical emergency, please call 911 or report to your local emergency department or urgent care.   If you have a non-emergency medical problem during routine business hours, please contact your provider's office and ask to speak with a nurse.   For questions related to your Healthy [facility] health plan, please call: [phone] or visit the homepage here: [email or web address]  If you would like to schedule transportation through your Healthy [facility] plan, please call the following number at least 2 days in advance of your appointment: [phone]  For information about your ride after you set it up, call Ride Assist at [phone]. Use this number to activate a Will Call pickup, or if your transportation is late for a scheduled pickup. Use this number, too, if you need to make a change or cancel a previously scheduled reservation.  If you need transportation services right away, call [phone]. The after-hours call center is staffed 24 hours to handle ride assistance and urgent reservation requests (including discharges) 365 days a year. Urgent trips include sick visits, hospital discharge requests and life-sustaining treatment.  Call the [place] at [phone], at any time, 24 hours a day, 7 days a week. If you are in danger or need immediate medical attention call 911.  If you would like help to quit smoking, call 1-800-QUIT-NOW (330) 644-4307) OR Espaol: 1-855-Djelo-Ya (5-374-827-0786) o para ms informacin haga clic aqu or Text READY to 200-400 to register via text   Following is a copy of your plan of care:  Care Plan : LCSW Plan of Care   Updates made by Greg Cutter, LCSW since 12/27/2021 12:00 AM     Problem: Depression Identification (Depression)      Long-Range Goal: I want mental health support   Start Date: 12/27/2021  Priority: High  Note:   Timeframe:  Long-Range Goal Priority:  High Start Date:   12/27/21                Expected End Date:  ongoing                     Follow Up Date--01/10/22 at 9:30 am  - check out counseling - keep 90 percent of counseling appointments - schedule counseling appointment    Why is this important?             Beating depression may take some time.            If you don't feel better right away, don't give up on your treatment plan.    Current barriers:   Chronic Mental Health needs related to depression, stress and anxiety. Patient requires Support, Education, Resources, Referrals, Advocacy, and Care Coordination, in order to meet Unmet Mental Health Needs. Patient will implement clinical interventions discussed today to decrease symptoms of depression and increase knowledge and/or ability of: coping skills. Mental Health Concerns and Social Isolation Patient lacks knowledge of available community counseling agencies and resources.  Clinical Goal(s): verbalize understanding of plan for management of Anxiety, Depression, and Stress and demonstrate a reduction in symptoms. Patient will connect with a provider for ongoing mental health treatment, increase coping skills, healthy habits, self-management skills, and stress reduction  Patient Goals/Self-Care Activities: Over the next 120 days Attend scheduled medical appointments Utilize healthy coping skills and supportive resources discussed Contact PCP with any questions or concerns Keep 90 percent of counseling appointments Call your insurance provider for more information about your Enhanced Benefits  Check out counseling resources provided  Begin personal counseling with LCSW, to reduce and manage symptoms of  Depression and Stress, until well-established with mental health provider Incorporate into daily practice - relaxation techniques, deep breathing exercises, and mindfulness meditation strategies. Talk about feelings with friends, family members, spiritual advisor, etc. Contact LCSW directly [phone]), if you have questions, need assistance, or if additional social work needs are identified between now and our next scheduled telephone outreach call. Call 988 for mental health hotline/crisis line if needed (24/7 available) Try techniques to reduce symptoms of anxiety/negative thinking (deep breathing, distraction, positive self talk, etc)  - develop a personal safety plan - develop a plan to deal with triggers like holidays, anniversaries - exercise at least 2 to 3 times per week - have a plan for how to handle bad days - journal feelings and what helps to feel better or worse - spend time or talk with others at least 2 to 3 times per week - watch for early signs of feeling worse - begin personal counseling - call and visit an old friend - check out volunteer opportunities - join a support group - laugh; watch a funny movie or comedian - learn and use visualization or guided imagery - perform a random act of kindness - practice relaxation or meditation daily - start or continue a personal journal - practice positive thinking and self-talk -continue with compliance of taking medication  -identify current effective and ineffective coping strategies.  -implement positive self-talk in care to increase self-esteem, confidence and feelings of control.  -consider alternative and complementary therapy approaches such as meditation, mindfulness or yoga.  -journaling, prayer, worship services, meditation or pastoral counseling.  -increase participation in pleasurable group activities such as hobbies, singing, sports or volunteering).  -consider the use of meditative movement therapy such as  tai chi, yoga or qigong.  -start a regular daily exercise program based on tolerance, ability and patient choice to support positive thinking and activity      Patient Goals: Initial goal  April Carney, [place], MSW, [employer] Managed Medicaid LCSW [email or web address] Phone: [phone]

## 2021-12-27 NOTE — Patient Outreach (Addendum)
Medicaid Managed Care Social Work Note  12/27/2021 Name:  April Carney MRN:  [id number] DOB:  1980-09-14  April Carney is an 41 y.o. year old female who is a primary patient of April Carney.  The Medicaid Managed Care Coordination team was consulted for assistance with:  [place] and Resources  April Carney was given information about Medicaid Managed Care Coordination team services today. [name] Patient agreed to services and verbal consent obtained.  Engaged with patient  for by telephone forinitial visit in response to referral for case management and/or care coordination services.   Assessments/Interventions:  Review of past medical history, allergies, medications, health status, including review of consultants reports, laboratory and other test data, was performed as part of comprehensive evaluation and provision of chronic care management services.  SDOH: (Social Determinant of Health) assessments and interventions performed: SDOH Interventions    Flowsheet Row Most Recent Value  SDOH Interventions   Stress Interventions Offered April Carney, Provide Counseling       Advanced Directives Status:  See Care Plan for related entries.  Care Plan                 No Known Allergies  Medications Reviewed Today     Reviewed by April Cutter, LCSW (Social Worker) on 12/27/21 at [place] List Status: <None>   Medication Order Taking? Sig Documenting Provider Last Dose Status Informant  Accu-Chek FastClix Lancets MISC [id number] No Use to check blood glucose twice a day April Carney Taking Active Spouse/Significant Other  acetaminophen (TYLENOL) 325 MG tablet [id number] No Take 650 mg by mouth every 6 (six) hours as needed.  Provider, Historical, Carney Taking Active Spouse/Significant Other  allopurinol (ZYLOPRIM) 100 MG tablet [id number]  Take 1 tablet (100 mg total) by mouth daily. April Carney, DPM  Active    apixaban (ELIQUIS) 5 MG TABS tablet [id number] No TAKE 2 TABLETS (10 MG TOTAL) BY MOUTH 2 (TWO) TIMES DAILY FOR 5 DAYS, THEN DECREASE TO 1 TABLET ($RemoveB'5MG'kxWUIMBA$ ) TWICE DAILY  Patient taking differently: 5 mg 2 (two) times daily.   April Carney Taking Expired 09/27/21 2359 Self  Ascorbic Acid (VITAMIN C) 100 MG tablet [id number] No Take 100 mg by mouth daily. Provider, Historical, Carney Taking Active Self           Med Note April Carney, April Carney   Fri Nov 08, 2021 12:40 PM) Pt is not sure of dose  atorvastatin (LIPITOR) 40 MG tablet [id number] No Take 1 tablet (40 mg total) by mouth daily. April Rider, April Carney Taking Active   blood glucose meter kit and supplies KIT [id number] No Dispense based on patient and insurance preference. Use up to four times daily as directed. (FOR ICD-9 250.00, 250.01). April Hancock, Carney Taking Active Spouse/Significant Other  diclofenac Sodium (VOLTAREN) 1 % GEL [id number]  Apply 2 g topically 4 (four) times daily. April Carney  Active   Dulaglutide 1.5 MG/0.5ML [place] Aid [id number] No Inject 1.5 mg into the skin once a week. Provider, Historical, Carney Taking Active            Med Note April Carney, April Carney   Fri Nov 08, 2021 11:44 AM) Injects Sunday  Efinaconazole 10 % SOLN [id number] No Apply 1 drop topically daily. April Carney, DPM Taking Active   etonogestrel (NEXPLANON) 68 MG IMPL implant [id number] No 1 each by Subdermal route once. Provider, Historical, Carney Taking Active   ferrous  sulfate 325 (65 FE) MG tablet [id number] No TAKE 1 TABLET (325 MG TOTAL) BY MOUTH 2 (TWO) TIMES DAILY WITH A MEAL. April Carney Taking Expired 07/16/21 2359            Med Note April Carney   Fri Nov 08, 2021 12:43 PM) Patient is not taking iron tablet  FLUoxetine (PROZAC) 20 MG capsule [id number] No Take three pills each morning as directed.  Patient taking differently: Take 20 mg by mouth daily.   April Carney Taking Active   glucose blood (ACCU-CHEK GUIDE) test strip [id number] No  Use to check glucose twice a day April Carney Taking Active Spouse/Significant Other  levothyroxine (SYNTHROID) 175 MCG tablet [id number] No Take 175 mcg by mouth daily. Provider, Historical, Carney Taking Active   lisinopril (ZESTRIL) 10 MG tablet [id number] No Take 10 mg by mouth daily. Provider, Historical, Carney Taking Active   metFORMIN (GLUCOPHAGE) 500 MG tablet [id number] No TAKE 1 TABLET BY MOUTH WITH BREAKFAST AND 2 TABLETS WITH EVENING MEAL  Patient taking differently: 1,000 mg 2 (two) times daily. TAKE 1 TABLET BY MOUTH WITH BREAKFAST AND 2 TABLETS WITH EVENING MEAL   April Carney Taking Active Spouse/Significant Other  predniSONE (DELTASONE) 20 MG tablet [id number] No Take 2 tablets (40 mg total) by mouth daily with breakfast.  Patient not taking: Reported on 11/08/2021   April Carney Not Taking Active   promethazine-dextromethorphan (PROMETHAZINE-DM) 6.25-15 MG/5ML syrup [id number] No Take 5 mLs by mouth 4 (four) times daily as needed.  Patient not taking: Reported on 11/08/2021   April Carney Not Taking Active   zinc gluconate 50 MG tablet [id number] No Take 50 mg by mouth daily. Provider, Historical, Carney Taking Active Self           Med Note April Carney, April Carney   Fri Nov 08, 2021 12:42 PM) Patient is not sure of dose            Patient Active Problem List   Diagnosis Date Noted   History of CVA (cerebrovascular accident) 07/19/2020   History of pulmonary embolus (PE) 07/19/2020   Iron deficiency anemia due to chronic blood loss 07/19/2020   COVID-19    Stroke (cerebrum) ([place]) 07/09/2020   CVA (cerebral vascular accident) ([place]) 07/09/2020   Cerebrovascular accident (CVA) ([place]) 07/09/2020   Nexplanon insertion 05/25/2020   Anemia 08/22/2019   Submandibular sialoadenitis 07/15/2018   Elevated liver function tests 06/02/2017   Superficial fungus infection of skin 07/01/2016   Essential hypertension 07/01/2016   Diabetes mellitus ([place]) 12/12/2015    Hyperlipidemia 12/12/2015   Situational depression 12/11/2015   Family h/o Biliary atresia 04/24/2015   History of preterm delivery 04/24/2015   History of cesarean section 04/24/2015   Obesity 04/24/2015   Hypothyroidism 04/24/2015    Conditions to be addressed/monitored per PCP order:  Depression  Care Plan : LCSW Plan of Care  Updates made by April Cutter, LCSW since 12/27/2021 12:00 AM     Problem: Depression Identification (Depression)      Long-Range Goal: I want mental health support   Start Date: 12/27/2021  Priority: High  Note:   Timeframe:  Long-Range Goal Priority:  High Start Date:   12/27/21                Expected End Date:  ongoing  Follow Up Date--01/10/22 at 9:30 am  - check out counseling - keep 90 percent of counseling appointments - schedule counseling appointment    Why is this important?             Beating depression may take some time.            If you don't feel better right away, don't give up on your treatment plan.    Current barriers:   Chronic Mental Health needs related to depression, stress and anxiety. Patient requires Support, Education, Resources, Referrals, Advocacy, and Care Coordination, in order to meet Unmet Mental Health Needs. Patient will implement clinical interventions discussed today to decrease symptoms of depression and increase knowledge and/or ability of: coping skills. Mental Health Concerns and Social Isolation Patient lacks knowledge of available community counseling agencies and resources.  Clinical Goal(Carney): verbalize understanding of plan for management of Anxiety, Depression, and Stress and demonstrate a reduction in symptoms. Patient will connect with a provider for ongoing mental health treatment, increase coping skills, healthy habits, self-management skills, and stress reduction        Clinical Interventions:  Assessed patient'Carney previous and current treatment, coping skills, support system  and barriers to care. Patient provided hx  Verbalization of feelings encouraged, motivational interviewing employed Emotional support provided, positive coping strategies explored Self care/establishing healthy boundaries emphasized Patient reports that she is on Prozac but it is not effectively working for her and she would like to see someone that specializes in mental health medication therapy. Patient is interested in both counseling and psychiatry for herself and her son. [facility] LCSW sent patient 3 different documents by email that have: [place], [place] and [place]. Patient reports that she tried to get her son back involved with [facility] but they are no longer taking patients. [facility] LCSW educated patient on [facility] and [facility] [place] as options for her and her family to gain appropriate mental health services. [facility] LCSW completed joint call with patient in order to gain their walk in hours. Patient will review these two options and will make a decision.  Patient reports significant worsening depresion impacting their ability to function appropriately and carry out daily task. LCSW provided education on relaxation techniques such as meditation, deep breathing, massage, grounding exercises or yoga that can activate the body'Carney relaxation response and ease symptoms of stress and anxiety. LCSW ask that when pt is struggling with difficult emotions and racing thoughts that they start this relaxation response process. LCSW provided extensive education on healthy coping skills for anxiety. SW used active and reflective listening, validated patient'Carney feelings/concerns, and provided emotional support. Patient will work on implementing appropriate self-care habits into their daily routine such as: staying positive, writing a gratitude list, drinking water, staying active around the house, taking their medications as  prescribed, combating negative thoughts or emotions and staying connected with their family and friends. Positive reinforcement provided for this decision to work on this. When your car dies or a deadline looms, how do you respond? Long-term, low-grade or acute stress takes a serious toll on your body and mind, so don't ignore feelings of constant tension. Stress is a natural part of life. However, too much stress can harm our health, especially if it continues every day. This is chronic stress and can put you at risk for heart problems like heart disease and depression. Understand what'Carney happening inside your body and learn simple coping skills  to combat the negative impacts of everyday stressors.  Types of Stress There are two types of stress: Emotional - types of emotional stress are relationship problems, pressure at work, financial worries, experiencing discrimination or having a major life change. Physical - Examples of physical stress include being sick having pain, not sleeping well, recovery from an injury or having an alcohol and drug use disorder. Fight or Flight Sudden or ongoing stress activates your nervous system and floods your bloodstream with adrenaline and cortisol, two hormones that raise blood pressure, increase heart rate and spike blood sugar. These changes pitch your body into a fight or flight response. That enabled our ancestors to outrun saber-toothed tigers, and it'Carney helpful today for situations like dodging a car accident. But most modern chronic stressors, such as finances or a challenging relationship, keep your body in that heightened state, which hurts your health. Effects of Too Much Stress If constantly under stress, most of [place] will eventually start to function less well.  Multiple studies link chronic stress to a higher risk of heart disease, stroke, depression, weight gain, memory loss and even premature death, so it'Carney important to recognize the warning signals. Talk  to your doctor about ways to manage stress if you're experiencing any of these symptoms: Prolonged periods of poor sleep. Regular, severe headaches. Unexplained weight loss or gain. Feelings of isolation, withdrawal or worthlessness. Constant anger and irritability. Loss of interest in activities. Constant worrying or obsessive thinking. Excessive alcohol or drug use. Inability to concentrate.  10 Ways to Cope with Chronic Stress It'Carney key to recognize stressful situations as they occur because it allows you to focus on managing how you react. We all need to know when to close our eyes and take a deep breath when we feel tension rising. Use these tips to prevent or reduce chronic stress. 1. Rebalance Work and Home All work and no play? If you're spending too much time at the office, intentionally put more dates in your calendar to enjoy time for fun, either alone or with others. 2. Get Regular Exercise Moving your body on a regular basis balances the nervous system and increases blood circulation, helping to flush out stress hormones. Even a daily 20-minute walk makes a difference. Any kind of exercise can lower stress and improve your mood ? just pick activities that you enjoy and make it a regular habit. 3. Eat Well and Limit Alcohol and Stimulants Alcohol, nicotine and caffeine may temporarily relieve stress but have negative health impacts and can make stress worse in the long run. Well-nourished bodies cope better, so start with a good breakfast, add more organic fruits and vegetables for a well-balanced diet, avoid processed foods and sugar, try herbal tea and drink more water. 4. Connect with Supportive People Talking face to face with another person releases hormones that reduce stress. Lean on those good listeners in your life. 5. [place] Time Do you enjoy gardening, reading, listening to music or some other creative pursuit? Engage in activities that bring you pleasure and  joy; research shows that reduces stress by almost half and lowers your heart rate, too. 6. Practice Meditation, Stress Reduction or Yoga Relaxation techniques activate a state of restfulness that counterbalances your body'Carney fight-or-flight hormones. Even if this also means a 10-minute break in a long day: listen to music, read, go for a walk in nature, do a hobby, take a bath or spend time with a friend. Also consider taking a mindfulness-based stress reduction course  to learn effective, lasting tools or try a daily deep breathing or imagery practice. Deep Breathing Slow, calm and deep breathing can help you relax. Try these steps to focus on your breathing and repeat as needed. Find a comfortable position and close your eyes. Exhale and drop your shoulders. Breathe in through your nose; fill your lungs and then your belly. Think of relaxing your body, quieting your mind and becoming calm and peaceful. Breathe out slowly through your nose, relaxing your belly. Think of releasing tension, pain, worries or distress. Repeat steps three and four until you feel relaxed. Imagery This involves using your mind to excite the senses -- sound, vision, smell, taste and feeling. This may help ease your stress. Begin by getting comfortable and then do some slow breathing. Imagine a place you love being at. It could be somewhere from your childhood, somewhere you vacationed or just a place in your imagination. Feel how it is to be in the place you're imagining. Pay attention to the sounds, air, colors, and who is there with you. This is a place where you feel cared for and loved. All is well. You are safe. Take in all the smells, sounds, tastes and feelings. As you do, feel your body being nourished and healed. Feel the calm that surrounds you. Breathe in all the good. Breathe out any discomfort or tension. 7. Sleep Enough If you get less than seven to eight hours of sleep, your body won't tolerate stress as  well as it could. If stress keeps you up at night, address the cause and add extra meditation into your day to make up for the lost z'Carney. Try to get seven to nine hours of sleep each night. Make a regular bedtime schedule. Keep your room dark and cool. Try to avoid computers, TV, cell phones and tablets before bed. 8. Bond with Connections You Enjoy Go out for a coffee with a friend, chat with a neighbor, call a family member, visit with a clergy member, or even hang out with your pet. Clinical studies show that spending even a short time with a companion animal can cut anxiety levels almost in half. 9. Take a Vacation Getting away from it all can reset your stress tolerance by increasing your mental and emotional outlook, which makes you a happier, more productive person upon return. Leave your cellphone and laptop at home! 10. See a Counselor, Coach or Therapist If negative thoughts overwhelm your ability to make positive changes, it'Carney time to seek professional help. Make an appointment today--your health and life are worth it.  Motivational Interviewing employed Depression screen reviewed  PHQ2/ PHQ9 completed Mindfulness or Relaxation training provided Active listening / Reflection utilized  Advance Care and HCPOA education provided Emotional Support Provided Problem [place] strategies reviewed Provided psychoeducation for mental health needs  Provided brief CBT  Reviewed mental health medications and discussed importance of compliance:  Quality of sleep assessed & Sleep Hygiene techniques promoted  Participation in counseling encouraged  Verbalization of feelings encouraged  Suicidal Ideation/Homicidal Ideation assessed: Patient denies SI/HI  Review resources, discussed options and provided patient information about  [place] care team collaboration (see longitudinal plan of care) Patient Goals/Self-Care Activities: Over the next 120  days Attend scheduled medical appointments Utilize healthy coping skills and supportive resources discussed Contact PCP with any questions or concerns Keep 90 percent of counseling appointments Call your insurance provider for more information about your Enhanced Benefits  Check out counseling resources  provided  Begin personal counseling with LCSW, to reduce and manage symptoms of Depression and Stress, until well-established with mental health provider Incorporate into daily practice - relaxation techniques, deep breathing exercises, and mindfulness meditation strategies. Talk about feelings with friends, family members, spiritual advisor, etc. Contact LCSW directly [phone]), if you have questions, need assistance, or if additional social work needs are identified between now and our next scheduled telephone outreach call. Call [phone] for mental health hotline/crisis line if needed (24/7 available) Try techniques to reduce symptoms of anxiety/negative thinking (deep breathing, distraction, positive self talk, etc)  - develop a personal safety plan - develop a plan to deal with triggers like holidays, anniversaries - exercise at least 2 to 3 times per week - have a plan for how to handle bad days - journal feelings and what helps to feel better or worse - spend time or talk with others at least 2 to 3 times per week - watch for early signs of feeling worse - begin personal counseling - call and visit an old friend - check out volunteer opportunities - join a support group - laugh; watch a funny movie or comedian - learn and use visualization or guided imagery - perform a random act of kindness - practice relaxation or meditation daily - start or continue a personal journal - practice positive thinking and self-talk -continue with compliance of taking medication  -identify current effective and ineffective coping strategies.  -implement positive self-talk in care to increase  self-esteem, confidence and feelings of control.  -consider alternative and complementary therapy approaches such as meditation, mindfulness or yoga.  -journaling, prayer, worship services, meditation or pastoral counseling.  -increase participation in pleasurable group activities such as hobbies, singing, sports or volunteering).  -consider the use of meditative movement therapy such as tai chi, yoga or qigong.  -start a regular daily exercise program based on tolerance, ability and patient choice to support positive thinking and activity        11/08/2021   12:46 PM 05/14/2020   10:28 AM 08/04/2019    3:47 PM 05/24/2019    3:56 PM  Depression screen PHQ 2/9  Decreased Interest 1 1 0 0  Down, Depressed, Hopeless 0 1 0 0  PHQ - 2 Score 1 2 0 0  Altered sleeping  1    Tired, decreased energy  1    Change in appetite  1    Feeling bad or failure about yourself   0    Trouble concentrating  0    Moving slowly or fidgety/restless  0    PHQ-9 Score  5    Difficult doing work/chores  Not difficult at all      Patient Goals: Initial goal     Follow up:  Patient agrees to Care Plan and Follow-up.  Plan: The Managed Medicaid care management team will reach out to the patient again over the next 30 days.  Date/time of next scheduled Social Work care management/care coordination outreach:  01/10/22 at 9:15 am.  Eula Fried, BSW, MSW, [place] Medicaid LCSW [email or web address] Phone: [phone]

## 2021-12-28 NOTE — Progress Notes (Signed)
Subjective: 41 year old female presents the office today for follow-up evaluation of gout but also for surgical consultation given painful bunion on her left foot.  I states from a gout standpoint she states that she has been doing well but she still gets pain directly on the bump on the area of the bunion.  She is attempted offloading, shoe modifications without any significant improvement.  Objective: AAO x3, NAD DP/PT pulses palpable bilaterally, CRT less than 3 seconds Moderate bunions present on the left foot.  There is no sign of erythema and only trace edema.  There is the gout symptoms have much improved.  There is still tenderness directly on the area of the bunion on the left side.  There is no pain or crepitation with MPJ range of motion.  No hypermobility of the first ray. No pain with calf compression, swelling, warmth, erythema  Assessment: Capsulitis, bunion; resolving gout  Plan: -All treatment options discussed with the patient including all alternatives, risks, complications.  -Regards to gout she seems to be doing better.  Rechecking uric acid level.  Continue allopurinol. -In regards to the plan we discussed with conservative as well as surgical treatment options.  She is attempted numerous conservative treatments without any significant resolution she wants to proceed with surgical intervention.  I discussed with her likely also bunionectomy however given gout if there is significant arthritic changes would need to have a first MPJ arthrodesis.  We will determine this intraoperatively and we discussed both surgeries as well as both recoveries. -The incision placement as well as the postoperative course was discussed with the patient. I discussed risks of the surgery which include, but not limited to, infection, bleeding, pain, swelling, need for further surgery, delayed or nonhealing, painful or ugly scar, numbness or sensation changes, over/under correction, recurrence, transfer  lesions, further deformity, hardware failure, DVT/PE, loss of toe/foot. Patient understands these risks and wishes to proceed with surgery. The surgical consent was reviewed with the patient all 3 pages were signed. No promises or guarantees were given to the outcome of the procedure. All questions were answered to the best of my ability. Before the surgery the patient was encouraged to call the office if there is any further questions. The surgery will be performed at the [facility] on an outpatient basis. -She will need medical clearance.  Also to order a venous duplex to rule out any DVT preoperatively although she has no symptoms currently.  April Carney DPM

## 2021-12-30 ENCOUNTER — Encounter: Payer: Self-pay | Admitting: Podiatry

## 2022-01-06 ENCOUNTER — Ambulatory Visit (HOSPITAL_COMMUNITY)
Admission: RE | Admit: 2022-01-06 | Discharge: 2022-01-06 | Disposition: A | Discharge status: Home / Self Care | Payer: Medicaid Other | Source: Ambulatory Visit | Attending: Podiatry | Admitting: Podiatry

## 2022-01-06 ENCOUNTER — Telehealth: Payer: Self-pay | Admitting: *Deleted

## 2022-01-06 ENCOUNTER — Telehealth: Payer: Self-pay | Admitting: Podiatry

## 2022-01-06 DIAGNOSIS — Z86718 Personal history of other venous thrombosis and embolism: Secondary | ICD-10-CM

## 2022-01-06 NOTE — Telephone Encounter (Signed)
Left voice message on triage phone line with prelim DVT report

## 2022-01-06 NOTE — Telephone Encounter (Signed)
V&V is calling with preliminary report on vascular study results to r/o DVT,patient is negative.  Report is ready to view in epic.Any questions please call [phone].

## 2022-01-07 NOTE — Telephone Encounter (Signed)
Patient has been notified, verbalized understanding.

## 2022-01-10 ENCOUNTER — Other Ambulatory Visit: Payer: Self-pay

## 2022-01-10 NOTE — Patient Outreach (Signed)
[facility]) Care Management  01/10/2022  Zakkiyya LUANE ROCHON 05-Feb-1981 [id number]  LCSW completed [facility] outreach attempt today during scheduled appointment time but was unable to reach patient successfully. A HIPPA compliant voice message was left encouraging patient to return call once available. LCSW will ask Scheduling Care Guide to reschedule [facility] SW appointment with patient as well.  Dickie La, BSW, MSW, [employer] Managed Medicaid LCSW [facility]  [facility] [email or web address] Phone: [phone]

## 2022-01-10 NOTE — Patient Instructions (Signed)
Phenix Asencion Gowda ,   [facility] Managed Care Team is available to provide assistance to you with your healthcare needs at no cost and as a benefit of your [facility] Health plan. I'm sorry I was unable to reach you today for our scheduled appointment. Our care guide will call you to reschedule our telephone appointment. Please call me at the number below. I am available to be of assistance to you regarding your healthcare needs. .   Thank you,   Dickie La, BSW, MSW, LCSW Managed Medicaid LCSW [facility]  [email or web address] Phone: [phone]

## 2022-01-18 ENCOUNTER — Other Ambulatory Visit: Payer: Self-pay | Admitting: Adult Health

## 2022-01-22 ENCOUNTER — Other Ambulatory Visit: Payer: Self-pay | Admitting: Obstetrics and Gynecology

## 2022-01-22 NOTE — Patient Instructions (Signed)
Hi Ms. Mathes, sorry I missed you today, hope you are doing okay - as a part of your Medicaid benefit, you are eligible for care management and care coordination services at no cost or copay. I was unable to reach you by phone today but would be happy to help you with your health related needs. Please feel free to call me at [phone].  A member of the Managed Medicaid care management team will reach out to you again over the next 30 business days.   Kathi Der RN, BSN   [facility] - Managed Medicaid High Risk [phone].

## 2022-01-22 NOTE — Patient Outreach (Signed)
Care Coordination  01/22/2022  April Carney 09-20-1980 [id number]   Medicaid Managed Care   Unsuccessful Outreach Note  01/22/2022 Name: April Carney MRN: [id number] DOB: [birth year]  Referred by: Beatrix Fetters, MD Reason for referral : High Risk Managed Medicaid (Unsuccessful telephone outreach)   An unsuccessful telephone outreach was attempted today. The patient was referred to the case management team for assistance with care management and care coordination.   Follow Up Plan: The care management team will reach out to the patient again over the next 30 business days.   Kathi Der RN, BSN [place]  [facility] - Managed Medicaid High Risk [phone]

## 2022-01-31 ENCOUNTER — Other Ambulatory Visit: Payer: Self-pay | Admitting: Podiatry

## 2022-02-05 ENCOUNTER — Ambulatory Visit: Payer: Medicaid Other

## 2022-02-10 DIAGNOSIS — E119 Type 2 diabetes mellitus without complications: Secondary | ICD-10-CM | POA: Diagnosis not present

## 2022-02-10 DIAGNOSIS — Z13220 Encounter for screening for lipoid disorders: Secondary | ICD-10-CM | POA: Diagnosis not present

## 2022-02-14 ENCOUNTER — Telehealth: Payer: Self-pay

## 2022-02-14 NOTE — Telephone Encounter (Signed)
DOS 02/26/2022  [place] BUNIONECTOMY LT- [id number]  HEALTHY BLUE  DR. Ardelle Anton DID A PEER TO PEER ON 02/07/2022 WITH DR. SALTZMAN. COT [id number] APPROVED. AUTH # [id number]. GOOD TILL 04/26/22

## 2022-02-19 ENCOUNTER — Encounter (HOSPITAL_BASED_OUTPATIENT_CLINIC_OR_DEPARTMENT_OTHER): Payer: Self-pay | Admitting: Podiatry

## 2022-02-19 NOTE — Progress Notes (Addendum)
Addendum:  received 12 lead ekg dated 02-10-2022 from pt's pcp office via fax, placed in chart.   Spoke w/ via phone for pre-op interview--- pt Lab needs dos---- istat, urine preg              Lab results------ pt had ekg done at pcp office 02-10-2022  (called and requested 12 lead tracing to be faxed) COVID test -----patient states asymptomatic no test needed Arrive at ------- 1015 on 02-26-2022 NPO after MN NO Solid Food.  Clear liquids from MN until--- 0915 Med rec completed Medications to take morning of surgery ----- lipitor, zyrtec, prozac, synthroid Diabetic medication ----- do not take metofrmin morning of surgery Patient instructed no nail polish to be worn day of surgery Patient instructed to bring photo id and insurance card day of surgery Patient aware to have Driver (ride ) / caregiver  for 24 hours after surgery -- sig other, greg Patient Special Instructions ----- n/a Pre-Op special Istructions -----  received pt's PCP , Dr Samuella Cota, H&P with office visit 02-10-2022 and electronic signature dated 02-11-2022 in epic/ care everywhere (you will note that I printed office from epic since it included electronic signature along with what was faxed from Dr Ardelle Anton office.  Patient verbalized understanding of instructions that were given at this phone interview. Patient denies shortness of breath, chest pain, fever, cough at this phone interview.    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  PCP: Dr Samuella Cota (lov 02-10-2022 epic/ chart) Neurologist:  Dr Pearlean Brownie  Theron Arista 09-27-2021) Chest x-ray :  12-32-2021 care everywhere EKG :  requested ekg tracing done 02-10-2022 from pcp office Echo : 07-11-2020 Stress test:  no Activity level: no issues  Fasting Blood Sugar :  120s    / Checks Blood Sugar -- times a day:  BID Blood Thinner/ Instructions /Last Dose: Eliquis ASA / Instructions/ Last Dose : per pt was given instructions by pcp to stop 3 days prior to surgery and last dose to be 02-22-2022 evening dose

## 2022-02-26 ENCOUNTER — Ambulatory Visit (HOSPITAL_BASED_OUTPATIENT_CLINIC_OR_DEPARTMENT_OTHER): Payer: Medicaid Other | Admitting: Anesthesiology

## 2022-02-26 ENCOUNTER — Encounter: Payer: Self-pay | Admitting: Podiatry

## 2022-02-26 ENCOUNTER — Other Ambulatory Visit: Payer: Self-pay

## 2022-02-26 ENCOUNTER — Encounter (HOSPITAL_BASED_OUTPATIENT_CLINIC_OR_DEPARTMENT_OTHER): Payer: Self-pay | Admitting: Podiatry

## 2022-02-26 ENCOUNTER — Encounter (HOSPITAL_BASED_OUTPATIENT_CLINIC_OR_DEPARTMENT_OTHER)
Admission: RE | Disposition: A | Discharge status: Home / Self Care | Payer: Self-pay | Source: Home / Self Care | Attending: Podiatry

## 2022-02-26 ENCOUNTER — Ambulatory Visit (HOSPITAL_BASED_OUTPATIENT_CLINIC_OR_DEPARTMENT_OTHER)
Admission: RE | Admit: 2022-02-26 | Discharge: 2022-02-26 | Disposition: A | Discharge status: Home / Self Care | Payer: Medicaid Other | Attending: Podiatry | Admitting: Podiatry

## 2022-02-26 DIAGNOSIS — Z01818 Encounter for other preprocedural examination: Secondary | ICD-10-CM

## 2022-02-26 DIAGNOSIS — I10 Essential (primary) hypertension: Secondary | ICD-10-CM | POA: Diagnosis not present

## 2022-02-26 DIAGNOSIS — Z7985 Long-term (current) use of injectable non-insulin antidiabetic drugs: Secondary | ICD-10-CM | POA: Insufficient documentation

## 2022-02-26 DIAGNOSIS — E039 Hypothyroidism, unspecified: Secondary | ICD-10-CM | POA: Diagnosis not present

## 2022-02-26 DIAGNOSIS — Z86711 Personal history of pulmonary embolism: Secondary | ICD-10-CM | POA: Insufficient documentation

## 2022-02-26 DIAGNOSIS — G8918 Other acute postprocedural pain: Secondary | ICD-10-CM | POA: Diagnosis not present

## 2022-02-26 DIAGNOSIS — E119 Type 2 diabetes mellitus without complications: Secondary | ICD-10-CM | POA: Diagnosis not present

## 2022-02-26 DIAGNOSIS — M109 Gout, unspecified: Secondary | ICD-10-CM | POA: Insufficient documentation

## 2022-02-26 DIAGNOSIS — Z8673 Personal history of transient ischemic attack (TIA), and cerebral infarction without residual deficits: Secondary | ICD-10-CM | POA: Diagnosis not present

## 2022-02-26 DIAGNOSIS — M19072 Primary osteoarthritis, left ankle and foot: Secondary | ICD-10-CM | POA: Diagnosis not present

## 2022-02-26 DIAGNOSIS — Z6841 Body Mass Index (BMI) 40.0 and over, adult: Secondary | ICD-10-CM | POA: Insufficient documentation

## 2022-02-26 DIAGNOSIS — F32A Depression, unspecified: Secondary | ICD-10-CM | POA: Diagnosis not present

## 2022-02-26 DIAGNOSIS — Z87891 Personal history of nicotine dependence: Secondary | ICD-10-CM

## 2022-02-26 DIAGNOSIS — M2012 Hallux valgus (acquired), left foot: Secondary | ICD-10-CM | POA: Diagnosis not present

## 2022-02-26 DIAGNOSIS — F419 Anxiety disorder, unspecified: Secondary | ICD-10-CM | POA: Insufficient documentation

## 2022-02-26 DIAGNOSIS — F418 Other specified anxiety disorders: Secondary | ICD-10-CM

## 2022-02-26 DIAGNOSIS — M21612 Bunion of left foot: Secondary | ICD-10-CM

## 2022-02-26 DIAGNOSIS — Z7984 Long term (current) use of oral hypoglycemic drugs: Secondary | ICD-10-CM | POA: Diagnosis not present

## 2022-02-26 DIAGNOSIS — Z79899 Other long term (current) drug therapy: Secondary | ICD-10-CM | POA: Diagnosis not present

## 2022-02-26 HISTORY — DX: Long term (current) use of anticoagulants: Z79.01

## 2022-02-26 HISTORY — DX: Post-traumatic stress disorder, unspecified: F43.10

## 2022-02-26 HISTORY — DX: Chronic gout, unspecified, without tophus (tophi): M1A.9XX0

## 2022-02-26 HISTORY — DX: Type 2 diabetes mellitus without complications: E11.9

## 2022-02-26 HISTORY — DX: Personal history of diseases of the blood and blood-forming organs and certain disorders involving the immune mechanism: Z86.2

## 2022-02-26 HISTORY — DX: Hypothyroidism, unspecified: E03.9

## 2022-02-26 HISTORY — PX: HALLUX VALGUS AUSTIN: SHX6623

## 2022-02-26 HISTORY — DX: Presence of spectacles and contact lenses: Z97.3

## 2022-02-26 HISTORY — DX: Major depressive disorder, single episode, unspecified: F32.9

## 2022-02-26 LAB — POCT I-STAT, CHEM 8
BUN: 5 mg/dL — ABNORMAL LOW (ref 6–20)
Calcium, Ion: 1.24 mmol/L (ref 1.15–1.40)
Chloride: 101 mmol/L (ref 98–111)
Creatinine, Ser: 0.5 mg/dL (ref 0.44–1.00)
Glucose, Bld: 141 mg/dL — ABNORMAL HIGH (ref 70–99)
HCT: 40 % (ref 36.0–46.0)
Hemoglobin: 13.6 g/dL (ref 12.0–15.0)
Potassium: 3.9 mmol/L (ref 3.5–5.1)
Sodium: 138 mmol/L (ref 135–145)
TCO2: 24 mmol/L (ref 22–32)

## 2022-02-26 LAB — POCT PREGNANCY, URINE: Preg Test, Ur: NEGATIVE

## 2022-02-26 LAB — GLUCOSE, CAPILLARY: Glucose-Capillary: 121 mg/dL — ABNORMAL HIGH (ref 70–99)

## 2022-02-26 SURGERY — CORRECTION, HALLUX VALGUS
Anesthesia: Regional | Site: Foot | Laterality: Left

## 2022-02-26 MED ORDER — PROPOFOL 10 MG/ML IV BOLUS
INTRAVENOUS | Status: DC | PRN
  Administered 2022-02-26: 120 mg via INTRAVENOUS
  Administered 2022-02-26: 200 mg via INTRAVENOUS

## 2022-02-26 MED ORDER — ACETAMINOPHEN 500 MG PO TABS
1000.0000 mg | ORAL_TABLET | Freq: Once | ORAL | Status: AC
  Administered 2022-02-26: 1000 mg via ORAL

## 2022-02-26 MED ORDER — PROPOFOL 10 MG/ML IV BOLUS
INTRAVENOUS | Status: AC
  Filled 2022-02-26: qty 20

## 2022-02-26 MED ORDER — PHENYLEPHRINE 80 MCG/ML (10ML) SYRINGE FOR IV PUSH (FOR BLOOD PRESSURE SUPPORT)
PREFILLED_SYRINGE | INTRAVENOUS | Status: DC | PRN
  Administered 2022-02-26: 160 ug via INTRAVENOUS
  Administered 2022-02-26 (×2): 80 ug via INTRAVENOUS

## 2022-02-26 MED ORDER — FENTANYL CITRATE (PF) 100 MCG/2ML IJ SOLN
INTRAMUSCULAR | Status: AC
  Filled 2022-02-26: qty 2

## 2022-02-26 MED ORDER — CEFAZOLIN IN SODIUM CHLORIDE 3-0.9 GM/100ML-% IV SOLN
3.0000 g | INTRAVENOUS | Status: AC
  Administered 2022-02-26: 2 g via INTRAVENOUS

## 2022-02-26 MED ORDER — LIDOCAINE HCL (PF) 2 % IJ SOLN
INTRAMUSCULAR | Status: AC
  Filled 2022-02-26: qty 15

## 2022-02-26 MED ORDER — MIDAZOLAM HCL 2 MG/2ML IJ SOLN
2.0000 mg | Freq: Once | INTRAMUSCULAR | Status: AC
  Administered 2022-02-26: 2 mg via INTRAVENOUS

## 2022-02-26 MED ORDER — PROMETHAZINE HCL 25 MG PO TABS: 25.0000 mg | ORAL_TABLET | Freq: Three times a day (TID) | ORAL | 0 refills | Status: AC | PRN

## 2022-02-26 MED ORDER — FENTANYL CITRATE (PF) 100 MCG/2ML IJ SOLN
100.0000 ug | Freq: Once | INTRAMUSCULAR | Status: AC
  Administered 2022-02-26: 50 ug via INTRAVENOUS

## 2022-02-26 MED ORDER — DEXMEDETOMIDINE (PRECEDEX) IN NS 20 MCG/5ML (4 MCG/ML) IV SYRINGE
PREFILLED_SYRINGE | INTRAVENOUS | Status: DC | PRN
  Administered 2022-02-26 (×2): 8 ug via INTRAVENOUS

## 2022-02-26 MED ORDER — ONDANSETRON HCL 4 MG/2ML IJ SOLN
INTRAMUSCULAR | Status: DC | PRN
  Administered 2022-02-26: 4 mg via INTRAVENOUS

## 2022-02-26 MED ORDER — LACTATED RINGERS IV SOLN: INTRAVENOUS | Status: DC

## 2022-02-26 MED ORDER — MIDAZOLAM HCL 2 MG/2ML IJ SOLN
INTRAMUSCULAR | Status: AC
  Filled 2022-02-26: qty 2

## 2022-02-26 MED ORDER — FENTANYL CITRATE (PF) 100 MCG/2ML IJ SOLN: 25.0000 ug | INTRAMUSCULAR | Status: DC | PRN

## 2022-02-26 MED ORDER — DEXAMETHASONE SODIUM PHOSPHATE 10 MG/ML IJ SOLN
INTRAMUSCULAR | Status: DC | PRN
  Administered 2022-02-26: 10 mg via INTRAVENOUS

## 2022-02-26 MED ORDER — CHLORHEXIDINE GLUCONATE CLOTH 2 % EX PADS: 6.0000 | MEDICATED_PAD | Freq: Once | CUTANEOUS | Status: DC

## 2022-02-26 MED ORDER — DEXMEDETOMIDINE HCL IN NACL 80 MCG/20ML IV SOLN
INTRAVENOUS | Status: AC
  Filled 2022-02-26: qty 20

## 2022-02-26 MED ORDER — OXYCODONE-ACETAMINOPHEN 5-325 MG PO TABS: 1.0000 | ORAL_TABLET | Freq: Four times a day (QID) | ORAL | 0 refills | Status: AC | PRN

## 2022-02-26 MED ORDER — FENTANYL CITRATE (PF) 250 MCG/5ML IJ SOLN
INTRAMUSCULAR | Status: DC | PRN
  Administered 2022-02-26: 50 ug via INTRAVENOUS
  Administered 2022-02-26 (×2): 25 ug via INTRAVENOUS

## 2022-02-26 MED ORDER — CEFAZOLIN IN SODIUM CHLORIDE 3-0.9 GM/100ML-% IV SOLN
INTRAVENOUS | Status: AC
  Filled 2022-02-26: qty 100

## 2022-02-26 MED ORDER — CEPHALEXIN 500 MG PO CAPS: 500.0000 mg | ORAL_CAPSULE | Freq: Three times a day (TID) | ORAL | 0 refills | Status: AC

## 2022-02-26 MED ORDER — LIDOCAINE 2% (20 MG/ML) 5 ML SYRINGE
INTRAMUSCULAR | Status: DC | PRN
  Administered 2022-02-26: 100 mg via INTRAVENOUS

## 2022-02-26 MED ORDER — ACETAMINOPHEN 500 MG PO TABS
ORAL_TABLET | ORAL | Status: AC
  Filled 2022-02-26: qty 2

## 2022-02-26 SURGICAL SUPPLY — 60 items
3.2 mm Proximal Cortex Drill, QR ×1 IMPLANT
APL PRP STRL LF DISP 70% ISPRP (MISCELLANEOUS) ×1
BIT DRILL PROX CORTEX 3.2 (BIT) ×1 IMPLANT
BLADE AVERAGE 25X9 (BLADE) IMPLANT
BLADE SURG 15 STRL LF DISP TIS (BLADE) ×1 IMPLANT
BLADE SURG 15 STRL SS (BLADE) ×2
BNDG CMPR 9X4 STRL LF SNTH (GAUZE/BANDAGES/DRESSINGS) ×1
BNDG ELASTIC 3X5.8 VLCR STR LF (GAUZE/BANDAGES/DRESSINGS) ×2 IMPLANT
BNDG ELASTIC 4X5.8 VLCR STR LF (GAUZE/BANDAGES/DRESSINGS) ×2 IMPLANT
BNDG ESMARK 4X9 LF (GAUZE/BANDAGES/DRESSINGS) ×2 IMPLANT
BNDG GAUZE DERMACEA FLUFF (GAUZE/BANDAGES/DRESSINGS) ×1
BNDG GAUZE DERMACEA FLUFF 4 (GAUZE/BANDAGES/DRESSINGS) ×1 IMPLANT
BNDG GZE DERMACEA 4 6PLY (GAUZE/BANDAGES/DRESSINGS) ×1
CHLORAPREP W/TINT 26 (MISCELLANEOUS) ×2 IMPLANT
COVER BACK TABLE 60X90IN (DRAPES) ×2 IMPLANT
CUFF TOURN SGL QUICK 18X4 (TOURNIQUET CUFF) IMPLANT
CUFF TOURN SGL QUICK 24 (TOURNIQUET CUFF)
CUFF TRNQT CYL 24X4X16.5-23 (TOURNIQUET CUFF) IMPLANT
DRAPE 3/4 80X56 (DRAPES) ×2 IMPLANT
DRAPE C-ARM 35X43 STRL (DRAPES) IMPLANT
DRAPE EXTREMITY T 121X128X90 (DISPOSABLE) ×2 IMPLANT
DRAPE U-SHAPE 47X51 STRL (DRAPES) ×2 IMPLANT
ELECT REM PT RETURN 9FT ADLT (ELECTROSURGICAL) ×2
ELECTRODE REM PT RTRN 9FT ADLT (ELECTROSURGICAL) ×1 IMPLANT
GAUZE 4X4 16PLY ~~LOC~~+RFID DBL (SPONGE) ×2 IMPLANT
GAUZE SPONGE 4X4 12PLY STRL (GAUZE/BANDAGES/DRESSINGS) ×2 IMPLANT
GAUZE XEROFORM 1X8 LF (GAUZE/BANDAGES/DRESSINGS) ×2 IMPLANT
GLOVE BIO SURGEON STRL SZ7.5 (GLOVE) ×2 IMPLANT
GLOVE BIOGEL PI IND STRL 8 (GLOVE) ×1 IMPLANT
GLOVE BIOGEL PI INDICATOR 8 (GLOVE) ×1
GOWN STRL REUS W/TWL LRG LVL3 (GOWN DISPOSABLE) ×2 IMPLANT
K-WIRE .045X4 (WIRE) ×2
K-WIRE SURGICAL 1.6X102 (WIRE) IMPLANT
KIT TURNOVER CYSTO (KITS) ×2 IMPLANT
KWIRE .045X4 (WIRE) IMPLANT
NDL HYPO 25X1 1.5 SAFETY (NEEDLE) ×1 IMPLANT
NEEDLE HYPO 25X1 1.5 SAFETY (NEEDLE) ×2 IMPLANT
NS IRRIG 1000ML POUR BTL (IV SOLUTION) IMPLANT
PACK BASIN DAY SURGERY FS (CUSTOM PROCEDURE TRAY) ×2 IMPLANT
PENCIL SMOKE EVACUATOR (MISCELLANEOUS) ×2 IMPLANT
PIN CAPS ORTHO GREEN .062 (PIN) IMPLANT
SCREW HEAD T10 SM 16X3XCANN (Screw) IMPLANT
SCREW HEADLESS 3.0X16 (Screw) ×2 IMPLANT
STAPLER VISISTAT 35W (STAPLE) IMPLANT
STOCKINETTE 6  STRL (DRAPES) ×2
STOCKINETTE 6 STRL (DRAPES) ×1 IMPLANT
SUCTION FRAZIER HANDLE 10FR (MISCELLANEOUS) ×2
SUCTION TUBE FRAZIER 10FR DISP (MISCELLANEOUS) ×1 IMPLANT
SUT ETHILON 4 0 PS 2 18 (SUTURE) ×2 IMPLANT
SUT MNCRL AB 3-0 PS2 18 (SUTURE) ×2 IMPLANT
SUT MNCRL AB 4-0 PS2 18 (SUTURE) ×2 IMPLANT
SUT PROLENE 4 0 PS 2 18 (SUTURE) IMPLANT
SUT VIC AB 2-0 SH 27 (SUTURE) ×2
SUT VIC AB 2-0 SH 27XBRD (SUTURE) ×1 IMPLANT
SYR BULB EAR ULCER 3OZ GRN STR (SYRINGE) ×2 IMPLANT
SYR CONTROL 10ML LL (SYRINGE) ×2 IMPLANT
TOWEL OR 17X26 10 PK STRL BLUE (TOWEL DISPOSABLE) ×2 IMPLANT
TRAY DSU PREP LF (CUSTOM PROCEDURE TRAY) ×2 IMPLANT
UNDERPAD 30X36 HEAVY ABSORB (UNDERPADS AND DIAPERS) ×2 IMPLANT
YANKAUER SUCT BULB TIP NO VENT (SUCTIONS) IMPLANT

## 2022-02-26 NOTE — Anesthesia Procedure Notes (Addendum)
Procedure Name: LMA Insertion Date/Time: 02/26/2022 12:19 PM  Performed by: Dairl Ponder, CRNAPre-anesthesia Checklist: Patient identified, Emergency Drugs available, Suction available and Patient being monitored Patient Re-evaluated:Patient Re-evaluated prior to induction Oxygen Delivery Method: Circle System Utilized Preoxygenation: Pre-oxygenation with 100% oxygen Induction Type: IV induction Ventilation: Mask ventilation without difficulty LMA: LMA inserted LMA Size: 3.0 Number of attempts: 1 Airway Equipment and Method: Bite block Placement Confirmation: positive ETCO2 Tube secured with: Tape Dental Injury: Teeth and Oropharynx as per pre-operative assessment

## 2022-02-26 NOTE — Discharge Instructions (Addendum)
See written instruction  Resume all home medications as prior Start antibiotics as precaution Pain medication as needed. Wear CAM boot at all times, keep foot elevated     Post Anesthesia Home Care Instructions  Activity: Get plenty of rest for the remainder of the day. A responsible individual must stay with you for 24 hours following the procedure.  For the next 24 hours, DO NOT: -Drive a car -Advertising copywriter -Drink alcoholic beverages -Take any medication unless instructed by your physician -Make any legal decisions or sign important papers.  Meals: Start with liquid foods such as gelatin or soup. Progress to regular foods as tolerated. Avoid greasy, spicy, heavy foods. If nausea and/or vomiting occur, drink only clear liquids until the nausea and/or vomiting subsides. Call your physician if vomiting continues.  Special Instructions/Symptoms: Your throat may feel dry or sore from the anesthesia or the breathing tube placed in your throat during surgery. If this causes discomfort, gargle with warm salt water. The discomfort should disappear within 24 hours.  Regional Blocks  1. Numbness or the inability to move the "blocked" extremity may last from 3-48 hours after placement. The length of time depends on the medication injected and your individual response to the medication. If the numbness is not going away after 48 hours, call your surgeon.  2. The extremity that is blocked will need to be protected until the numbness is gone and the  Strength has returned. Because you cannot feel it, you will need to take extra care to avoid injury. Because it may be weak, you may have difficulty moving it or using it. You may not know what position it is in without looking at it while the block is in effect.  3. For blocks in the legs and feet, returning to weight bearing and walking needs to be done carefully. You will need to wait until the numbness is entirely gone and the strength has  returned. You should be able to move your leg and foot normally before you try and bear weight or walk. You will need someone to be with you when you first try to ensure you do not fall and possibly risk injury.  4. Bruising and tenderness at the needle site are common side effects and will resolve in a few days.  5. Persistent numbness or new problems with movement should be communicated to the surgeon.

## 2022-02-26 NOTE — H&P (Signed)
  Patient presents today for surgical correction of bunion. We again discussed the surgery. Discussed likely Austin bunionectomy but if the joint is arthritic due to gout, will need to proceed with MTPJ fusion. She agrees to this. Consent signed.   She has a history of PE, but no history of DVT. She is on Eliquis but has stopped for the surgery.   Will proceed as scheduled. Site marked and consent signed. Full H&P by her PCP

## 2022-02-26 NOTE — Progress Notes (Signed)
Orthopedic Tech Progress Note Patient Details:  April Carney [birth year] [id number]  Patient ID: [name], female   DOB: 09-20-1980, 41 y.o.   MRN: [id number]  [name] 02/26/2022, 1:31 PM Cam walker applied to left leg at [facility]

## 2022-02-26 NOTE — Anesthesia Preprocedure Evaluation (Addendum)
Anesthesia Evaluation  Patient identified by MRN, date of birth, ID band Patient awake    Reviewed: Allergy & Precautions, NPO status , Patient's Chart, lab work & pertinent test results  Airway Mallampati: III  TM Distance: >3 FB Neck ROM: Full  Mouth opening: Limited Mouth Opening  Dental  (+) Chipped, Dental Advisory Given, Poor Dentition,    Pulmonary former smoker, PE   Pulmonary exam normal breath sounds clear to auscultation       Cardiovascular hypertension, Pt. on medications Normal cardiovascular exam Rhythm:Regular Rate:Normal  TTE 2021 1. Left ventricular ejection fraction, by estimation, is 55 to 60%. The  left ventricle has normal function. Left ventricular endocardial border  not optimally defined to evaluate regional wall motion. Left ventricular  diastolic parameters are consistent  with Grade I diastolic dysfunction (impaired relaxation).  2. Right ventricular systolic function is normal. Tricuspid regurgitation  signal is inadequate for assessing PA pressure.  3. The mitral valve is normal in structure. Trivial mitral valve  regurgitation.  4. The aortic valve is normal in structure.    Neuro/Psych PSYCHIATRIC DISORDERS Anxiety Depression CVA    GI/Hepatic negative GI ROS, Neg liver ROS,   Endo/Other  diabetes, Type 2, Oral Hypoglycemic AgentsHypothyroidism Morbid obesity (BMI 45)  Renal/GU negative Renal ROS  negative genitourinary   Musculoskeletal negative musculoskeletal ROS (+)   Abdominal   Peds  Hematology  (+) Blood dyscrasia (on eliquis), ,   Anesthesia Other Findings   Reproductive/Obstetrics                            Anesthesia Physical Anesthesia Plan  ASA: 3  Anesthesia Plan: General and Regional   Post-op Pain Management: Regional block* and Tylenol PO (pre-op)*   Induction: Intravenous  PONV Risk Score and Plan: 3 and Ondansetron,  Dexamethasone and Midazolam  Airway Management Planned: LMA  Additional Equipment:   Intra-op Plan:   Post-operative Plan: Extubation in OR  Informed Consent: I have reviewed the patients History and Physical, chart, labs and discussed the procedure including the risks, benefits and alternatives for the proposed anesthesia with the patient or authorized representative who has indicated his/her understanding and acceptance.     Dental advisory given  Plan Discussed with: CRNA  Anesthesia Plan Comments:         Anesthesia Quick Evaluation

## 2022-02-26 NOTE — Progress Notes (Signed)
Assisted Dr. Woodrum with left, popliteal, ultrasound guided block. Side rails up, monitors on throughout procedure. See vital signs in flow sheet. Tolerated Procedure well. 

## 2022-02-26 NOTE — Brief Op Note (Signed)
02/26/2022  1:20 PM  PATIENT:  April Carney  41 y.o. female  PRE-OPERATIVE DIAGNOSIS:  BUNION OF LEFT FOOT  POST-OPERATIVE DIAGNOSIS:  BUNION OF LEFT FOOT  PROCEDURE:  Procedure(s): HALLUX VALGUS AUSTIN (Left)  SURGEON:  Surgeon(s) and Role:    * Vivi Barrack, DPM - Primary  PHYSICIAN ASSISTANT:   ASSISTANTS: none   ANESTHESIA:   general  EBL:  minimal   BLOOD ADMINISTERED:none  DRAINS: none   LOCAL MEDICATIONS USED:  NONE  SPECIMEN:  No Specimen  DISPOSITION OF SPECIMEN:  N/A  COUNTS:  YES  TOURNIQUET:   Total Tourniquet Time Documented: Calf (Left) - 39 minutes Total: Calf (Left) - 39 minutes   DICTATION: .Reubin Milan Dictation  PLAN OF CARE: Discharge to home after PACU  PATIENT DISPOSITION:  PACU - hemodynamically stable.   Delay start of Pharmacological VTE agent (>24hrs) due to surgical blood loss or risk of bleeding: no  Intraoperative findings: No significant arthritic changes, mild tophus medially. Austin bunionectomy preformed, screw to adequate compression and was stable.

## 2022-02-26 NOTE — Transfer of Care (Signed)
Immediate Anesthesia Transfer of Care Note  Patient: April Carney  Procedure(s) Performed: HALLUX VALGUS AUSTIN (Left: Foot)  Patient Location: PACU  Anesthesia Type:General and Regional  Level of Consciousness: drowsy and patient cooperative  Airway & Oxygen Therapy: Patient Spontanous Breathing  Post-op Assessment: Report given to RN and Post -op Vital signs reviewed and stable  Post vital signs: Reviewed and stable  Last Vitals:  Vitals Value Taken Time  BP 130/80 02/26/22 1318  Temp    Pulse 66 02/26/22 1322  Resp 17 02/26/22 1322  SpO2 94 % 02/26/22 1322  Vitals shown include unvalidated device data.  Last Pain:  Vitals:   02/26/22 1030  TempSrc: Oral  PainSc: 0-No pain      Patients Stated Pain Goal: 5 (02/26/22 1030)  Complications: No notable events documented.

## 2022-02-27 ENCOUNTER — Encounter: Payer: Self-pay | Admitting: Obstetrics and Gynecology

## 2022-02-27 ENCOUNTER — Other Ambulatory Visit: Payer: Self-pay | Admitting: Obstetrics and Gynecology

## 2022-02-27 MED ORDER — DEXAMETHASONE SODIUM PHOSPHATE 10 MG/ML IJ SOLN
INTRAMUSCULAR | Status: DC | PRN
  Administered 2022-02-26: 5 mg

## 2022-02-27 MED ORDER — ROPIVACAINE HCL 5 MG/ML IJ SOLN
INTRAMUSCULAR | Status: DC | PRN
  Administered 2022-02-26: 30 mL via PERINEURAL

## 2022-02-27 NOTE — Anesthesia Postprocedure Evaluation (Signed)
Anesthesia Post Note  Patient: April Carney  Procedure(s) Performed: [place] (Left: Foot)     Patient location during evaluation: PACU Anesthesia Type: Regional and MAC Level of consciousness: awake and alert Pain management: pain level controlled Vital Signs Assessment: post-procedure vital signs reviewed and stable Respiratory status: spontaneous breathing, nonlabored ventilation, respiratory function stable and patient connected to nasal cannula oxygen Cardiovascular status: blood pressure returned to baseline and stable Postop Assessment: no apparent nausea or vomiting Anesthetic complications: no   No notable events documented.  Last Vitals:  Vitals:   02/26/22 1345 02/26/22 1419  BP: 101/63 120/88  Pulse: 71 70  Resp: 13 14  Temp:  36.9 C  SpO2: 96% 99%    Last Pain:  Vitals:   02/26/22 1419  TempSrc:   PainSc: 0-No pain                 Tysheem Accardo S

## 2022-02-27 NOTE — Anesthesia Procedure Notes (Signed)
Anesthesia Regional Block: Popliteal block   Pre-Anesthetic Checklist: , timeout performed,  Correct Patient, Correct Site, Correct Laterality,  Correct Procedure, Correct Position, site marked,  Risks and benefits discussed,  Pre-op evaluation,  At surgeon's request and post-op pain management  Laterality: Left  Prep: Maximum Sterile Barrier Precautions used, chloraprep       Needles:  Injection technique: Single-shot  Needle Type: Echogenic Stimulator Needle     Needle Length: 9cm  Needle Gauge: 21     Additional Needles:   Procedures:,,,, ultrasound used (permanent image in chart),,    Narrative:  Start time: 02/26/2022 12:04 PM End time: 02/26/2022 12:07 PM Injection made incrementally with aspirations every 5 mL. Anesthesiologist: Elmer Picker, MD

## 2022-02-27 NOTE — Patient Outreach (Signed)
Medicaid Managed Care   Nurse Care Manager Note  02/27/2022 Name:  April Carney MRN:  [id number] DOB:  [birth year]  April Carney is an 41 y.o. year old female who is a primary patient of Kotturi, Tyler Deis, MD.  The [facility] Managed Care Coordination team was consulted for assistance with:    Chronic case management follow up, DM, HLD, depression, hypothyroid, HTN, h/o CVA  April Carney was given information about Medicaid Managed Care Coordination team services today. April Carney Patient agreed to services and verbal consent obtained.  Engaged with patient by telephone for follow up visit in response to provider referral for case management and/or care coordination services.   Assessments/Interventions:  Review of past medical history, allergies, medications, health status, including review of consultants reports, laboratory and other test data, was performed as part of comprehensive evaluation and provision of chronic care management services.  SDOH (Social Determinants of Health) assessments and interventions performed:  Care Plan  Allergies  Allergen Reactions   Ammonium-Containing Compounds Other (See Comments)    Turns skin beet red and swelling at site   Medications Reviewed Today     Reviewed by Shawna Orleans, RN (Registered Nurse) on 02/26/22 at 23  Med List Status: <None>   Medication Order Taking? Sig Documenting Provider Last Dose Status Informant  Accu-Chek FastClix Lancets MISC [id number] Yes Use to check blood glucose twice a day Maryruth Hancock, MD 02/25/2022 Active Spouse/Significant Other  acetaminophen (TYLENOL) 325 MG tablet [id number] Yes Take 650 mg by mouth every 6 (six) hours as needed.  [provider] 02/24/2022 Active Spouse/Significant Other, Self  allopurinol (ZYLOPRIM) 100 MG tablet [id number] Yes Take 1 tablet by mouth once daily  Patient taking differently: Take 100 mg by mouth at bedtime.   Trula Slade, DPM 02/25/2022 Active Self   apixaban (ELIQUIS) 5 MG TABS tablet [id number] Yes TAKE 2 TABLETS (10 MG TOTAL) BY MOUTH 2 (TWO) TIMES DAILY FOR 5 DAYS, THEN DECREASE TO 1 TABLET ($RemoveB'5MG'BITPSzpZ$ ) TWICE DAILY  Patient taking differently: Take 5 mg by mouth 2 (two) times daily.   Thurnell Lose, MD 02/22/2022 Expired 02/19/22 2359 Self  Ascorbic Acid (VITAMIN C) 100 MG tablet [id number] Yes Take 100 mg by mouth daily. [provider] 02/22/2022 Active Self           Med Note Linus Mako, DENISE P   Wed Feb 19, 2022 11:28 AM) .  atorvastatin (LIPITOR) 40 MG tablet [id number] Yes Take 1 tablet by mouth once daily  Patient taking differently: Take 40 mg by mouth daily.   Frann Rider, NP 02/26/2022 0730 Active Self  blood glucose meter kit and supplies KIT [id number] Yes Dispense based on patient and insurance preference. Use up to four times daily as directed. (FOR ICD-9 250.00, 250.01). Maryruth Hancock, MD 02/25/2022 Active Spouse/Significant Other  cetirizine (ZYRTEC) 10 MG tablet [id number] Yes Take 10 mg by mouth daily. [provider] 02/26/2022 0730 Active Self  diphenhydrAMINE (BENADRYL) 25 MG tablet [id number] Yes Take 25 mg by mouth at bedtime as needed for sleep. [provider] Past Month Active Self  Dulaglutide 1.5 MG/0.5ML SOPN [id number] Yes Inject 1.5 mg into the skin once a week. [provider] 02/16/2022 Active Self           Med Note Broadus John, [name]   Fri Nov 08, 2021 11:44 AM) Injects Sunday  etonogestrel (NEXPLANON) 68 MG IMPL implant [id number] Yes 1 each by Subdermal route once. [provider]  Active Self           Med Note Burnard Leigh   Wed Feb 19, 2022 11:26 AM) Per pt Inserted 10/ 2021  FLUoxetine (PROZAC) 20 MG capsule [id number] Yes Take three pills each morning as directed.  Patient taking differently: Take 20 mg by mouth daily.   [name], MD 02/26/2022 0730 Active Self  glucose blood (ACCU-CHEK GUIDE) test strip [id number] Yes Use to check glucose twice a day Maryruth Hancock, MD 02/25/2022 Active Spouse/Significant Other  levothyroxine (SYNTHROID) 175 MCG tablet [id number] Yes Take 175 mcg by mouth daily. [provider] 02/26/2022 0730 Active Self  lisinopril (ZESTRIL) 10 MG tablet [id number] Yes Take 10 mg by mouth at bedtime. [provider] 02/25/2022 Active Self  metFORMIN (GLUCOPHAGE) 1000 MG tablet [id number] Yes Take 1,000 mg by mouth 2 (two) times daily with a meal. [provider] 02/25/2022 Active Self  zinc gluconate 50 MG tablet [id number] Yes Take 50 mg by mouth daily. [provider] 02/25/2022 Active Self           Med Note Linus Mako, DENISE P   Wed Feb 19, 2022 11:27 AM) .           Patient Active Problem List   Diagnosis Date Noted   History of CVA (cerebrovascular accident) 07/19/2020   History of pulmonary embolus (PE) 07/19/2020   Iron deficiency anemia due to chronic blood loss 07/19/2020   COVID-19    Stroke (cerebrum) ([place]) 07/09/2020   CVA (cerebral vascular accident) ([place]) 07/09/2020   Cerebrovascular accident (CVA) ([place]) 07/09/2020   Nexplanon insertion 05/25/2020   Anemia 08/22/2019   Submandibular sialoadenitis 07/15/2018   Elevated liver function tests 06/02/2017   Superficial fungus infection of skin 07/01/2016   Essential hypertension 07/01/2016   Diabetes mellitus ([place]) 12/12/2015   Hyperlipidemia 12/12/2015   Situational depression 12/11/2015   Family h/o Biliary atresia 04/24/2015   History of preterm delivery 04/24/2015   History of cesarean section 04/24/2015   Obesity 04/24/2015   Hypothyroidism 04/24/2015   Conditions to be addressed/monitored per PCP order:  Chronic case management follow up, DM, HLD, depression, hypothyroid, HTN, h/o CVA  Care Plan : [place]  Updates made by [name], RN since 02/27/2022 12:00 AM     Problem: Knowledge Deficit and Care Coordination Needs Related to Management of HTN, DM, HLD, cerebrovascular  disease, obesity   Priority:  High     Long-Range Goal: Development of Plan Of Care to Address Care Coordination Needs and Knowledge Deficits for Management of HTN, DM, HLD, cerebrovascular disease, obesity   Start Date: 11/08/2021  Expected End Date: 11/09/2022  Note:   Current Barriers:  Knowledge Deficits related to plan of care for management of HTN, HLD, DMII, and cerebrovascular disease and obesity  58/3/23:  Patient without complaint today, left bunionectomy yesterday.  BP and BG WNL.  Most recent A1C decreased to 6.1.  Patient states she will make appts with dentist and eye provider.  Declines need for SW at this time. RNCM Clinical Goal(s):  Patient will verbalize basic understanding of HTN, HLD, DMII, and obesity and cerebrovascular disease process and self health management plan as evidenced by no hospital admissions or ED visits related to complications of chronic health issues  and meeting treatment targets for chronic health issues through collaboration with RN Care manager, provider, and care team.  Interventions: Inter-disciplinary care team collaboration (see longitudinal plan of care) Evaluation of current treatment  plan related to  self management and patient's adherence to plan as established by provider Ensured patient received dental and ophthalmologic resources from a community care guide.  Ensured patient received written information via mail on obstructive sleep apnea and Healthy [employer] Program and HLD , ADA DM booklet and planning healthy meals brochure Collaborated with LCSW for depression and referral LCSW referral for depression and resources-completed.  Diabetes:  (Status: Goal on Track (progressing): YES.) Long Term Goal - patient reports majority of home monitored CBGs are meeting treatment targets  Lab Results  Component Value Date   HGBA1C 6.5 (H) 09/27/2021   Hgb A1C-6.1 on 02/10/22 Assessed patient's understanding of A1C goal: <6.5%, reviewed A1C target Provided education to  patient about basic DM disease process; Reviewed medications with patient and discussed importance of medication adherence;        Counseled on importance of regular laboratory monitoring as prescribed;        Discussed plans with patient for ongoing care management follow up and provided patient with direct contact information for care management team;      Assessed frequency and values of self monitored CBGs and reviewed targets for pre and post meal Ensured patient received American Diabetes Association Booklet on managing diabetes and Planning Healthy Meals brochure that was mailed to her  Hypertension: (Status: Goal on Track (progressing): YES.) Long Term Goal  Last practice recorded BP readings:  BP Readings from Last 3 Encounters:  10/06/21 124/87  09/27/21 125/86  09/17/21 (!) 150/97  Most recent eGFR/CrCl: No results found for: EGFR  No components found for: CRCL  Evaluation of current treatment plan related to hypertension self management and patient's adherence to plan as established by provider;   Reviewed medications with patient and discussed importance of compliance;  Discussed plans with patient for ongoing care management follow up and provided patient with direct contact information for care management team; Reminded patient she may obtain an upper arm home BP monitor to replace her wrist monitor by asking her primary care provider to fax a Rx to [place] Ensured patient received written health information on HTN that was mailed to her  Stroke:  (Status:Goal on track:  Yes.) Long Term Goal- patient denies new neurological symptoms, reports good medication taking behavior  Reviewed Importance of taking all medications as prescribed Reviewed Importance of attending all scheduled provider appointments  Ensured patient received  written education via mail on stroke prevention  Weight Loss Interventions:  (Status:  New goal.) Long Term Goal Wt Readings from Last 3  Encounters:  10/06/21 275 lb (124.7 kg)  09/27/21 276 lb (125.2 kg)  07/06/21 275 lb (124.7 kg)  Provided patient and/or caregiver with contact information about weight watchers program offered through her Healthy [employer] Medicaid health plan (community resource or dietician)  Asked permission to discuss weight management with patient then provided health plan weight management information and instructed patient on steps to take to get enrolled in the  program  if she is interested  Hyperlipidemia Interventions:  (Status:  New goal.) Long Term Goal Lab Results  Component Value Date   CHOL 150 09/27/2021   HDL 29 (L) 09/27/2021   LDLCALC 84 09/27/2021   TRIG 216 (H) 09/27/2021   CHOLHDL 5.2 (H) 09/27/2021      Medication review performed; medication list updated in electronic medical record.  Ensured patient received written health information on preventing high cholesterol in the mail  Patient Goals/Self-Care Activities: Take medications as prescribed  Attend all scheduled provider appointments Call pharmacy for medication refills 3-7 days in advance of running out of medications Perform all self care activities independently  Perform IADL's (shopping, preparing meals, housekeeping, managing finances) independently Call provider office for new concerns or questions    Long-Range Goal: Establish Plan of Care for Chronic Disease Management Needs   Priority: High  Note:   Timeframe:  Long-Range Goal Priority:  High Start Date:       12/25/21                      Expected End Date:    ongoing                  Follow Up Date 04/08/22   - schedule appointment for vaccines needed due to my age or health - schedule recommended health tests (blood work, mammogram, colonoscopy, pap test) - schedule and keep appointment for annual check-up    Why is this important?   Screening tests can find diseases early when they are easier to treat.  Your doctor or nurse will talk with you  about which tests are important for you.  Getting shots for common diseases like the flu and shingles will help prevent them.  02/27/22:  Patient with recent evaluation by PCP 02/10/22.     Follow Up:  Patient agrees to Care Plan and Follow-up.  Plan: The Managed Medicaid care management team will reach out to the patient again over the next 30 business  days. and The  Patient has been provided with contact information for the Managed Medicaid care management team and has been advised to call with any health related questions or concerns.  Date/time of next scheduled RN care management/care coordination outreach: 04/08/22 at 1030.

## 2022-02-27 NOTE — Addendum Note (Signed)
Addendum  created 02/27/22 0940 by Elmer Picker, MD   Child order released for a procedure order, Clinical Note Signed, Intraprocedure Blocks edited, Intraprocedure Meds edited, SmartForm saved

## 2022-02-27 NOTE — Patient Instructions (Signed)
Hi April Carney, thank you for speaking with me today, I am glad you are doing well.  April Carney was given information about Medicaid Managed Care team care coordination services as a part of their Healthy [facility] Medicaid benefit. April Carney verbally consented to engagement with the [facility] Managed Care team.   If you are experiencing a medical emergency, please call 911 or report to your local emergency department or urgent care.   If you have a non-emergency medical problem during routine business hours, please contact your provider's office and ask to speak with a nurse.   For questions related to your Healthy [facility] health plan, please call: [phone] or visit the homepage here: [email or web address]  If you would like to schedule transportation through your Healthy [facility] plan, please call the following number at least 2 days in advance of your appointment: [phone]  For information about your ride after you set it up, call Ride Assist at [phone]. Use this number to activate a Will Call pickup, or if your transportation is late for a scheduled pickup. Use this number, too, if you need to make a change or cancel a previously scheduled reservation.  If you need transportation services right away, call [phone]. The after-hours call center is staffed 24 hours to handle ride assistance and urgent reservation requests (including discharges) 365 days a year. Urgent trips include sick visits, hospital discharge requests and life-sustaining treatment.  Call the [place] at [phone], at any time, 24 hours a day, 7 days a week. If you are in danger or need immediate medical attention call 911.  If you would like help to quit smoking, call 1-800-QUIT-NOW (206) 599-2765) OR Espaol: 1-855-Djelo-Ya (4-076-808-8110) o para ms informacin haga clic aqu or Text READY to 200-400 to register via text  April Carney - following are the goals we discussed in your visit today:   Goals Addressed    Long-Range Goal: Establish Plan of Care for Chronic Disease Management Needs   Priority: High  Note:   Timeframe:  Long-Range Goal Priority:  High Start Date:       12/25/21                      Expected End Date:    ongoing                  Follow Up Date 04/08/22   - schedule appointment for vaccines needed due to my age or health - schedule recommended health tests (blood work, mammogram, colonoscopy, pap test) - schedule and keep appointment for annual check-up    Why is this important?   Screening tests can find diseases early when they are easier to treat.  Your doctor or nurse will talk with you about which tests are important for you.  Getting shots for common diseases like the flu and shingles will help prevent them.  02/27/22:  Patient with recent evaluation by PCP 02/10/22.    Care Plan : RN Care Manager Plan Of Care  Updates made by [name], RN since 02/27/2022 12:00 AM     Problem: Knowledge Deficit and Care Coordination Needs Related to Management of HTN, DM, HLD, cerebrovascular  disease, obesity   Priority: High     Long-Range Goal: Development of Plan Of Care to Address Care Coordination Needs and Knowledge Deficits for Management of HTN, DM, HLD, cerebrovascular disease, obesity   Start Date: 11/08/2021  Expected End Date: 11/09/2022  Note:  Current Barriers:  Knowledge Deficits related to plan of care for management of HTN, HLD, DMII, and cerebrovascular disease and obesity  58/3/23:  Patient without complaint today, left bunionectomy yesterday.  BP and BG WNL.  Most recent A1C decreased to 6.1.  Patient states she will make appts with dentist and eye provider.  Declines need for SW at this time. RNCM Clinical Goal(s):  Patient will verbalize basic understanding of HTN, HLD, DMII, and obesity and cerebrovascular disease process and self health management plan as evidenced  by no hospital admissions or ED visits related to complications of chronic health issues  and meeting treatment targets for chronic health issues through collaboration with RN Care manager, provider, and care team.  Interventions: Inter-disciplinary care team collaboration (see longitudinal plan of care) Evaluation of current treatment plan related to  self management and patient's adherence to plan as established by provider Ensured patient received dental and ophthalmologic resources from a community care guide.  Ensured patient received written information via mail on obstructive sleep apnea and Healthy [employer] Program and HLD , ADA DM booklet and planning healthy meals brochure Collaborated with LCSW for depression and referral LCSW referral for depression and resources-completed.  Diabetes:  (Status: Goal on Track (progressing): YES.) Long Term Goal - patient reports majority of home monitored CBGs are meeting treatment targets  Lab Results  Component Value Date   HGBA1C 6.5 (H) 09/27/2021   Hgb A1C-6.1 on 02/10/22 Assessed patient's understanding of A1C goal: <6.5%, reviewed A1C target Provided education to patient about basic DM disease process; Reviewed medications with patient and discussed importance of medication adherence;        Counseled on importance of regular laboratory monitoring as prescribed;        Discussed plans with patient for ongoing care management follow up and provided patient with direct contact information for care management team;      Assessed frequency and values of self monitored CBGs and reviewed targets for pre and post meal Ensured patient received American Diabetes Association Booklet on managing diabetes and Planning Healthy Meals brochure that was mailed to her  Hypertension: (Status: Goal on Track (progressing): YES.) Long Term Goal  Last practice recorded BP readings:  BP Readings from Last 3 Encounters:  10/06/21 124/87  09/27/21 125/86   09/17/21 (!) 150/97  Most recent eGFR/CrCl: No results found for: EGFR  No components found for: CRCL  Evaluation of current treatment plan related to hypertension self management and patient's adherence to plan as established by provider;   Reviewed medications with patient and discussed importance of compliance;  Discussed plans with patient for ongoing care management follow up and provided patient with direct contact information for care management team; Reminded patient she may obtain an upper arm home BP monitor to replace her wrist monitor by asking her primary care provider to fax a Rx to [place] Ensured patient received written health information on HTN that was mailed to her  Stroke:  (Status:Goal on track:  Yes.) Long Term Goal- patient denies new neurological symptoms, reports good medication taking behavior  Reviewed Importance of taking all medications as prescribed Reviewed Importance of attending all scheduled provider appointments  Ensured patient received  written education via mail on stroke prevention  Weight Loss Interventions:  (Status:  New goal.) Long Term Goal Wt Readings from Last 3 Encounters:  10/06/21 275 lb (124.7 kg)  09/27/21 276 lb (125.2 kg)  07/06/21 275 lb (124.7 kg)  Provided patient and/or caregiver with  contact information about weight watchers program offered through her [place] Medicaid health plan (community resource or dietician)  Asked permission to discuss weight management with patient then provided health plan weight management information and instructed patient on steps to take to get enrolled in the  program  if she is interested  Hyperlipidemia Interventions:  (Status:  New goal.) Long Term Goal Lab Results  Component Value Date   CHOL 150 09/27/2021   HDL 29 (L) 09/27/2021   LDLCALC 84 09/27/2021   TRIG 216 (H) 09/27/2021   CHOLHDL 5.2 (H) 09/27/2021      Medication review performed; medication list updated  in electronic medical record.  Ensured patient received written health information on preventing high cholesterol in the mail  Patient Goals/Self-Care Activities: Take medications as prescribed   Attend all scheduled provider appointments Call pharmacy for medication refills 3-7 days in advance of running out of medications Perform all self care activities independently  Perform IADL's (shopping, preparing meals, housekeeping, managing finances) independently Call provider office for new concerns or questions    Patient verbalizes understanding of instructions and care plan provided today and agrees to view in [place]. Active MyChart status and patient understanding of how to access instructions and care plan via MyChart confirmed with patient.     The Managed Medicaid care management team will reach out to the patient again over the next 30 business  days.  The  Patient has been provided with contact information for the Managed Medicaid care management team and has been advised to call with any health related questions or concerns.   [name] RN, BSN [place]  [facility] - Managed Medicaid High Risk [phone]

## 2022-02-28 ENCOUNTER — Encounter (HOSPITAL_BASED_OUTPATIENT_CLINIC_OR_DEPARTMENT_OTHER): Payer: Self-pay | Admitting: Podiatry

## 2022-03-03 ENCOUNTER — Ambulatory Visit (INDEPENDENT_AMBULATORY_CARE_PROVIDER_SITE_OTHER): Payer: Medicaid Other

## 2022-03-03 ENCOUNTER — Ambulatory Visit (INDEPENDENT_AMBULATORY_CARE_PROVIDER_SITE_OTHER): Payer: Medicaid Other | Admitting: Podiatry

## 2022-03-03 DIAGNOSIS — Z9889 Other specified postprocedural states: Secondary | ICD-10-CM

## 2022-03-03 DIAGNOSIS — M21612 Bunion of left foot: Secondary | ICD-10-CM

## 2022-03-05 NOTE — Progress Notes (Signed)
Subjective: Chief Complaint  Patient presents with   Routine Post Op    POV #1 DOS 02/26/2022 CORRECTION OF LT FOOT BUNION [place] BUNIONECTOMY VS 1ST MTPJ FUSION Patient is having some pain, rate of pain is a 3 out of 10, Patient denies any N/V/F/C/SOB, TX: Cam boot, Keflex,  pain meds, X-Rays were taken today   41 year old female presents with above complaints.  She recently underwent surgery last week on her left foot for bunion.  She states that she has been doing well.  Pain is controlled currently.  She has been in the cam boot.  Denies any fevers or chills.  No other concerns.  Objective: AAO x3, NAD DP/PT pulses palpable bilaterally, CRT less than 3 seconds Status post left foot bunionectomy with incision well coapted and sutures intact.  There is mild edema there is no erythema or any signs of infection.  Mild discomfort to palpation surgical site.  No areas of point tenderness otherwise.  Clinically the toe is in rectus position.   No pain with calf compression, swelling, warmth, erythema  Assessment: Status post left [place] Bunionectomy  Plan: -All treatment options discussed with the patient including all alternatives, risks, complications.  -X-rays obtained reviewed of the left foot.  3 views were obtained.  Status post first metatarsal osteotomy with screw fixation.  No evidence of acute fracture. -Antibiotic ointment was applied followed by dressing.  Keep dressing clean, dry, intact. -Continue cam boot.  Weight-bear as tolerated but limited weightbearing. -Ice and elevation. -Monitor for any clinical signs or symptoms of infection and directed to call the office immediately should any occur or go to the ER. -Patient encouraged to call the office with any questions, concerns, change in symptoms.   Vivi Barrack DPM

## 2022-03-06 ENCOUNTER — Other Ambulatory Visit: Payer: Self-pay | Admitting: Podiatry

## 2022-03-07 NOTE — Telephone Encounter (Signed)
Please advise 

## 2022-03-08 NOTE — Op Note (Signed)
PATIENT:  April Carney  41 y.o. female   PRE-OPERATIVE DIAGNOSIS:  BUNION OF LEFT FOOT   POST-OPERATIVE DIAGNOSIS:  BUNION OF LEFT FOOT   PROCEDURE:  Procedure(s): HALLUX VALGUS AUSTIN (Left)   SURGEON:  Surgeon(s) and Role:    * Vivi Barrack, DPM - Primary   PHYSICIAN ASSISTANT:    ASSISTANTS: none    ANESTHESIA:   general   EBL:  minimal    BLOOD ADMINISTERED:none   DRAINS: none    LOCAL MEDICATIONS USED:  NONE   SPECIMEN:  No Specimen   DISPOSITION OF SPECIMEN:  N/A   COUNTS:  YES   TOURNIQUET:   Total Tourniquet Time Documented: Calf (Left) - 39 minutes Total: Calf (Left) - 39 minutes     DICTATION: .Reubin Milan Dictation   PLAN OF CARE: Discharge to home after PACU   PATIENT DISPOSITION:  PACU - hemodynamically stable.   Delay start of Pharmacological VTE agent (>24hrs) due to surgical blood loss or risk of bleeding: no   Intraoperative findings: No significant arthritic changes, mild tophus medially. Austin bunionectomy preformed, screw to adequate compression and was stable.   Indications for surgery: 41 year old female presents today for surgical correction of bunion of her left foot.  She also has had gout previously this area and despite treatment of the gout she is continued pain.  X-ray findings that show increase in first intermetatarsal angle.  She had pain directly on the bump on the bunion as well.  Due to this we discussed surgical intervention.  Discussed with her Serafina Royals with possible first MPJ arthrodesis if needed if there is significant arthritis given her gout.  We discussed both procedures as well as the postoperative course.  Alternatives and risks and complications were discussed.  No promises or guarantees were given as to the outcome of the procedure and all questions were answered to the best my ability.  Procedure in detail: The patient was both verbally and visually identified by myself, nursing staff, the  anesthesia staff preoperatively.  She was then transferred to the operating room via stretcher and placed on the operative table in supine position.  A well-padded pneumatic calf tourniquet is applied to the left lower extremity.  After an adequate plane of anesthesia was obtained the left lower extremities and scrubbed, prepped, draped in normal sterile fashion.  Timeout was performed.  The left lower extremity was exsanguinated with an Esmarch bandage pneumatic calf tourniquet was inflated to 250 mmHg.  A linear incision was made along the distal half of the first metatarsal to the first MPJ medial to the extensor tendon.  Incision was made with a 15 blade scalpel the epidermis and the dermis.  The subcutaneous tissues were then bluntly and sharply dissected and began to retract all vital neurovascular structures and all bleeders were cauterized and ligated as necessary.  A deep incision was made along the distal portion of the left first metatarsal adjacent to the extensor tendon and all soft tissue structures were freed from the first metatarsal head.  There is found to be some tophus changes present on the medial aspect of the joint but the joint itself did not appear to be arthritic.  Due to this elected to proceed with the [facility].  The joint was released with a Web designer.  This improved range of motion of the first MPJ.  At this time the medial eminence was resected with a sagittal bone saw.  I then utilized a sagittal saw to  create the osteotomy for the first metatarsal for the awesome anatomy.  Once the osteotomy was created but the capital fragment was distracted and shifted laterally into the correct position.  Temporary guidewire was placed for Acumed 4.0 millimeter screw.  Fluoroscopy also confirm placement and a secondary wire was also used for temporary fixation to hold the outside to be stable.  Once the appropriate position and then countersunk as well as drilled for the  screw.  Acumed 4.0 m headless screw was then inserted adequate compression.  Guidewires removed.  The capital fragment was stressed and appear to be stable.  The overhanging medial bone was then resected with a sagittal saw.  At this time was felt the joint was back in adequate position and the medial eminence which was causing her pain was adequately resected.  The incision was copiously irrigated with saline and hemostasis was achieved.  Incision was then closed in layered fashion the deep structures with 3-0 Monocryl followed by subcutaneous tissues with 4 Monocryl and skin was then closed with 5-0 Monocryl in running subcuticular fashion.  Xeroform was applied followed by dry sterile dressing.  The tourniquet was released there was found to be an immediate cap refill from the digit.  At this time she was woken from anesthesia and found to tolerate the procedure well any complications.  She was transferred to PACU with vital signs stable and vascular status intact.

## 2022-03-13 ENCOUNTER — Ambulatory Visit (INDEPENDENT_AMBULATORY_CARE_PROVIDER_SITE_OTHER): Payer: Medicaid Other | Admitting: Podiatry

## 2022-03-13 DIAGNOSIS — Z9889 Other specified postprocedural states: Secondary | ICD-10-CM

## 2022-03-13 DIAGNOSIS — M21612 Bunion of left foot: Secondary | ICD-10-CM

## 2022-03-15 DIAGNOSIS — M21612 Bunion of left foot: Secondary | ICD-10-CM | POA: Insufficient documentation

## 2022-03-15 NOTE — Progress Notes (Signed)
Subjective: Chief Complaint  Patient presents with   Routine Post Op    Left foot bunion post op, POV# 2, no pain  no N/V/F/C/SOB      41 year old female presents with above complaints.  States she has been doing well.  Pain is controlled.  Denies any fevers or chills.  She has been wearing the cam boot but does state that one time she went without the boot walking.  Objective: AAO x3, NAD DP/PT pulses palpable bilaterally, CRT less than 3 seconds Status post left foot bunionectomy with incision well coapted and sutures intact.  There is decreased edema present there is no erythema or signs of infection noted today.  Minimal tenderness palpation.  No other areas of discomfort.  Toes in rectus position.   No pain with calf compression, swelling, warmth, erythema  Assessment: Status post left Austin Bunionectomy  Plan: -All treatment options discussed with the patient including all alternatives, risks, complications.  -Incision is healing well.  Antibiotic ointment and a bandage applied.  Discussed that she can start changing the bandage and wash the foot with soap and water daily.  Dry thoroughly after washing but still would not submerge left foot. -Continue cam boot.  Weight-bear as tolerated but limited weightbearing but still in the cam boot -Ice and elevation. -Monitor for any clinical signs or symptoms of infection and directed to call the office immediately should any occur or go to the ER. -Patient encouraged to call the office with any questions, concerns, change in symptoms.   Vivi Barrack DPM

## 2022-03-27 ENCOUNTER — Ambulatory Visit (INDEPENDENT_AMBULATORY_CARE_PROVIDER_SITE_OTHER): Payer: Medicaid Other

## 2022-03-27 ENCOUNTER — Ambulatory Visit (INDEPENDENT_AMBULATORY_CARE_PROVIDER_SITE_OTHER): Payer: Medicaid Other | Admitting: Podiatry

## 2022-03-27 DIAGNOSIS — Z9889 Other specified postprocedural states: Secondary | ICD-10-CM

## 2022-03-27 DIAGNOSIS — M21612 Bunion of left foot: Secondary | ICD-10-CM

## 2022-03-28 ENCOUNTER — Encounter: Payer: Self-pay | Admitting: Adult Health

## 2022-03-31 NOTE — Progress Notes (Signed)
Subjective: Chief Complaint  Patient presents with   Routine Post Op    POV #3 DOS 02/26/2022 CORRECTION OF LT FOOT BUNION AUSTIN BUNIONECTOMY VS 1ST MTPJ FUSION- patient stated she is doing good. No pain.    41 year old female presents with above complaints.  She states she is doing well.  Pain is controlled not taking any pain medication this time.  Denies any fevers or chills.  No other concerns.   Objective: AAO x3, NAD DP/PT pulses palpable bilaterally, CRT less than 3 seconds Status post left foot bunionectomy with incision well coapted and sutures intact.  There is decreased edema present there is no erythema or signs of infection noted today.  No significant pain on exam today.  No other areas of discomfort.  Toes in rectus position.   No pain with calf compression, swelling, warmth, erythema  Assessment: Status post left Austin Bunionectomy  Plan: -All treatment options discussed with the patient including all alternatives, risks, complications.  -X-rays obtained reviewed.  Status post first metatarsal ostomy with hardware intact with any complicating factors. -Continue mobilization for 2 more weeks then she can slowly transition to partial weightbearing as tolerated. -Ice and elevation. -Monitor for any clinical signs or symptoms of infection and directed to call the office immediately should any occur or go to the ER. -Patient encouraged to call the office with any questions, concerns, change in symptoms.   Vivi Barrack DPM

## 2022-04-01 ENCOUNTER — Other Ambulatory Visit: Payer: Self-pay | Admitting: *Deleted

## 2022-04-01 MED ORDER — APIXABAN 5 MG PO TABS
5.0000 mg | ORAL_TABLET | Freq: Two times a day (BID) | ORAL | 0 refills | Status: AC
Start: 2022-04-01 — End: 2024-05-20

## 2022-04-01 NOTE — Telephone Encounter (Signed)
Can refill for 90 day supply but she will need to find new PCP that can refill/manage or schedule f/u with oncology/hematology who she saw back in February.  Thank you.

## 2022-04-08 ENCOUNTER — Other Ambulatory Visit: Payer: Self-pay | Admitting: Obstetrics and Gynecology

## 2022-04-08 NOTE — Patient Outreach (Signed)
Medicaid Managed Care   Nurse Care Manager Note  04/08/2022 Name:  April Carney MRN:  [id number] DOB:  01/25/1981  April Carney is an 41 y.o. year old female who is a primary patient of Kotturi, Tyler Deis, MD.  The Medicaid Managed Care Coordination team was consulted for assistance with:    Chronic healthcare management needs, DM, HLD, depression, hypothyroid, HTN, h/o CVA  Ms. Hebard was given information about Medicaid Managed Care Coordination team services today. April Carney Patient agreed to services and verbal consent obtained.  Engaged with patient by telephone for follow up visit in response to provider referral for case management and/or care coordination services.   Assessments/Interventions:  Review of past medical history, allergies, medications, health status, including review of consultants reports, laboratory and other test data, was performed as part of comprehensive evaluation and provision of chronic care management services.  SDOH (Social Determinants of Health) assessments and interventions performed: SDOH Interventions    Flowsheet Row Patient Outreach Telephone from 04/08/2022 in [place] Patient Outreach Telephone from 02/27/2022 in [place] Patient Outreach Telephone from 12/27/2021 in [place] Patient Outreach Telephone from 12/25/2021 in [place] Patient Outreach Telephone from 11/08/2021 in [facility] Office Visit from 05/14/2020 in [facility]  SDOH Interventions        Food Insecurity Interventions -- Intervention Not Indicated -- -- Intervention Not Indicated --  Housing Interventions -- -- -- -- Intervention Not Indicated --  Transportation Interventions -- -- -- Intervention Not Indicated Intervention Not Indicated --   Utilities Interventions Intervention Not Indicated -- -- -- -- --  Depression Interventions/Treatment  -- -- -- -- -- Medication  Financial Strain Interventions -- -- -- -- Intervention Not Indicated --  Physical Activity Interventions -- -- -- Intervention Not Indicated, Other (Comments)  [patient states does not feel like exercising right now] -- --  Stress Interventions -- -- [employer], Provide Counseling -- Intervention Not Indicated --  Social Connections Interventions Intervention Not Indicated -- -- -- -- --      Care Plan  Allergies  Allergen Reactions   Ammonium-Containing Compounds Other (See Comments)    Turns skin beet red and swelling at site    Medications Reviewed Today     Reviewed by [name], RN (Registered Nurse) on 04/08/22 at [place] List Status: <None>   Medication Order Taking? Sig Documenting Provider Last Dose Status Informant  Accu-Chek FastClix Lancets MISC [id number] No Use to check blood glucose twice a day Maryruth Hancock, MD 02/25/2022 Active Spouse/Significant Other  acetaminophen (TYLENOL) 325 MG tablet [id number] No Take 650 mg by mouth every 6 (six) hours as needed.  [provider] 02/24/2022 Active Spouse/Significant Other, Self  allopurinol (ZYLOPRIM) 100 MG tablet [id number]  Take 1 tablet by mouth once daily Trula Slade, DPM  Active   apixaban (ELIQUIS) 5 MG TABS tablet [id number]  Take 1 tablet (5 mg total) by mouth 2 (two) times daily. Frann Rider, NP  Active   Ascorbic Acid (VITAMIN C) 100 MG tablet [id number] No Take 100 mg by mouth daily. [provider] 02/22/2022 Active Self           Med Note Linus Mako, DENISE P   Wed Feb 19, 2022 11:28 AM) .  atorvastatin (LIPITOR) 40 MG tablet [id number] No Take 1 tablet by mouth  once daily  Patient taking differently: Take 40 mg by mouth daily.   Frann Rider, NP 02/26/2022 0730 Active Self  blood glucose meter kit and supplies KIT [id number] No  Dispense based on patient and insurance preference. Use up to four times daily as directed. (FOR ICD-9 250.00, 250.01). Maryruth Hancock, MD 02/25/2022 Active Spouse/Significant Other  cephALEXin (KEFLEX) 500 MG capsule [id number]  Take 1 capsule (500 mg total) by mouth 3 (three) times daily. Trula Slade, DPM  Active   cetirizine (ZYRTEC) 10 MG tablet [id number] No Take 10 mg by mouth daily. [provider] 02/26/2022 0730 Active Self  diphenhydrAMINE (BENADRYL) 25 MG tablet [id number] No Take 25 mg by mouth at bedtime as needed for sleep. [provider] Past Month Active Self  Dulaglutide 1.5 MG/0.5ML SOPN [id number] No Inject 1.5 mg into the skin once a week. [provider] 02/16/2022 Active Self           Med Note Broadus John, [name]   Fri Nov 08, 2021 11:44 AM) Injects Sunday  etonogestrel (NEXPLANON) 68 MG IMPL implant [id number] No 1 each by Subdermal route once. [provider] Taking Active Self           Med Note Burnard Leigh   Wed Feb 19, 2022 11:26 AM) Per pt Inserted 10/ 2021  FLUoxetine (PROZAC) 20 MG capsule [id number] No Take three pills each morning as directed.  Patient taking differently: Take 20 mg by mouth daily.   [name], MD 02/26/2022 0730 Active Self  glucose blood (ACCU-CHEK GUIDE) test strip [id number] No Use to check glucose twice a day Maryruth Hancock, MD 02/25/2022 Active Spouse/Significant Other  levothyroxine (SYNTHROID) 175 MCG tablet [id number] No Take 175 mcg by mouth daily. [provider] 02/26/2022 0730 Active Self  lisinopril (ZESTRIL) 10 MG tablet [id number] No Take 10 mg by mouth at bedtime. [provider] 02/25/2022 Active Self  metFORMIN (GLUCOPHAGE) 1000 MG tablet [id number] No Take 1,000 mg by mouth 2 (two) times daily with a meal. [provider] 02/25/2022 Active Self  oxyCODONE-acetaminophen (PERCOCET) 5-325 MG tablet [id number]  Take 1-2 tablets by mouth every 6 (six) hours as needed for severe pain.  Trula Slade, DPM  Active   promethazine (PHENERGAN) 25 MG tablet [id number]  Take 1 tablet (25 mg total) by mouth every 8 (eight) hours as needed for nausea or vomiting. Trula Slade, DPM  Active   zinc gluconate 50 MG tablet [id number] No Take 50 mg by mouth daily. [provider] 02/25/2022 Active Self           Med Note Linus Mako, DENISE P   Wed Feb 19, 2022 11:27 AM) .           Patient Active Problem List   Diagnosis Date Noted   Bunion, left 03/15/2022   History of CVA (cerebrovascular accident) 07/19/2020   History of pulmonary embolus (PE) 07/19/2020   Iron deficiency anemia due to chronic blood loss 07/19/2020   COVID-19    Stroke (cerebrum) ([place]) 07/09/2020   CVA (cerebral vascular accident) ([place]) 07/09/2020   Cerebrovascular accident (CVA) ([place]) 07/09/2020   Nexplanon insertion 05/25/2020   Anemia 08/22/2019   Submandibular sialoadenitis 07/15/2018   Elevated liver function tests 06/02/2017   Superficial fungus infection of skin 07/01/2016   Essential hypertension 07/01/2016   Diabetes mellitus ([place]) 12/12/2015   Hyperlipidemia 12/12/2015   Situational depression 12/11/2015   Family h/o Biliary atresia 04/24/2015   History of preterm delivery  04/24/2015   History of cesarean section 04/24/2015   Obesity 04/24/2015   Hypothyroidism 04/24/2015   Conditions to be addressed/monitored per PCP order:  Chronic healthcare management needs, DM, HLD, depression, hypothyroid, HTN, h/o CVA  Care Plan : RN Care Manager Plan Of Care  Updates made by [name], RN since 04/08/2022 12:00 AM     Problem: Knowledge Deficit and Care Coordination Needs Related to Management of HTN, DM, HLD, cerebrovascular  disease, obesity   Priority: High     Long-Range Goal: Development of Plan Of Care to Address Care Coordination Needs and Knowledge Deficits for Management of HTN, DM, HLD, cerebrovascular disease, obesity   Start Date: 11/08/2021  Expected End Date:  11/09/2022  Note:   Current Barriers:  Knowledge Deficits related to plan of care for management of HTN, HLD, DMII, and cerebrovascular disease and obesity  04/08/22:  No complaints today, left bunionectomy healed and is wearing shoe.  Patient to schedule an appt with heme/onc, dental and eye provider.  BP and BG WNL.   RNCM Clinical Goal(s):  Patient will verbalize basic understanding of HTN, HLD, DMII, and obesity and cerebrovascular disease process and self health management plan as evidenced by no hospital admissions or ED visits related to complications of chronic health issues  and meeting treatment targets for chronic health issues through collaboration with RN Care manager, provider, and care team.  Interventions: Inter-disciplinary care team collaboration (see longitudinal plan of care) Evaluation of current treatment plan related to  self management and patient's adherence to plan as established by provider Ensured patient received dental and ophthalmologic resources from a community care guide.  Ensured patient received written information via mail on obstructive sleep apnea and Healthy [employer] Program and HLD , ADA DM booklet and planning healthy meals brochure Collaborated with LCSW for depression and referral LCSW referral for depression and resources-completed.  Diabetes:  (Status: Goal on Track (progressing): YES.) Long Term Goal - patient reports majority of home monitored CBGs are meeting treatment targets  Lab Results  Component Value Date   HGBA1C 6.5 (H) 09/27/2021   Hgb A1C-6.1 on 02/10/22 Assessed patient's understanding of A1C goal: <6.5%, reviewed A1C target Provided education to patient about basic DM disease process; Reviewed medications with patient and discussed importance of medication adherence;        Counseled on importance of regular laboratory monitoring as prescribed;        Discussed plans with patient for ongoing care management follow up and provided  patient with direct contact information for care management team;      Assessed frequency and values of self monitored CBGs and reviewed targets for pre and post meal Ensured patient received American Diabetes Association Booklet on managing diabetes and Planning Healthy Meals brochure that was mailed to her  Hypertension: (Status: Goal on Track (progressing): YES.) Long Term Goal  Last practice recorded BP readings:  BP Readings from Last 3 Encounters:  10/06/21 124/87  09/27/21 125/86  09/17/21 (!) 150/97  Most recent eGFR/CrCl: No results found for: EGFR  No components found for: CRCL  Evaluation of current treatment plan related to hypertension self management and patient's adherence to plan as established by provider;   Reviewed medications with patient and discussed importance of compliance;  Discussed plans with patient for ongoing care management follow up and provided patient with direct contact information for care management team; Reminded patient she may obtain an upper arm home BP monitor to replace her wrist monitor by  asking her primary care provider to fax a Rx to [place] Ensured patient received written health information on HTN that was mailed to her  Stroke:  (Status:Goal on track:  Yes.) Long Term Goal- patient denies new neurological symptoms, reports good medication taking behavior  Reviewed Importance of taking all medications as prescribed Reviewed Importance of attending all scheduled provider appointments  Ensured patient received  written education via mail on stroke prevention  Weight Loss Interventions:  (Status:  New goal.) Long Term Goal Wt Readings from Last 3 Encounters:  10/06/21 275 lb (124.7 kg)  09/27/21 276 lb (125.2 kg)  07/06/21 275 lb (124.7 kg)  Provided patient and/or caregiver with contact information about weight watchers program offered through her [place] (community resource or dietician)  Asked  permission to discuss weight management with patient then provided health plan weight management information and instructed patient on steps to take to get enrolled in the  program  if she is interested  Hyperlipidemia Interventions:  (Status:  New goal.) Long Term Goal Lab Results  Component Value Date   CHOL 150 09/27/2021   HDL 29 (L) 09/27/2021   LDLCALC 84 09/27/2021   TRIG 216 (H) 09/27/2021   CHOLHDL 5.2 (H) 09/27/2021      Medication review performed; medication list updated in electronic medical record.  Ensured patient received written health information on preventing high cholesterol in the mail  Patient Goals/Self-Care Activities: Take medications as prescribed   Attend all scheduled provider appointments Call pharmacy for medication refills 3-7 days in advance of running out of medications Perform all self care activities independently  Perform IADL's (shopping, preparing meals, housekeeping, managing finances) independently Call provider office for new concerns or questions      Long-Range Goal: Establish Plan of Care for Chronic Disease Management Needs   Priority: High  Note:   Timeframe:  Long-Range Goal Priority:  High Start Date:       12/25/21                      Expected End Date:    ongoing                  Follow Up Date 05/12/22   - schedule appointment for vaccines needed due to my age or health - schedule recommended health tests (blood work, mammogram, colonoscopy, pap test) - schedule and keep appointment for annual check-up    Why is this important?   Screening tests can find diseases early when they are easier to treat.  Your doctor or nurse will talk with you about which tests are important for you.  Getting shots for common diseases like the flu and shingles will help prevent them.  04/08/22:  Patient to schedule an appt with HEME/ONC, eye and dental provider    Follow Up:  Patient agrees to Care Plan and Follow-up.  Plan: The Managed  Medicaid care management team will reach out to the patient again over the next 30 business  days. and The  Patient has been provided with contact information for the Managed Medicaid care management team and has been advised to call with any health related questions or concerns.  Date/time of next scheduled RN care management/care coordination outreach:  05/12/22 at 0900.

## 2022-04-08 NOTE — Patient Instructions (Signed)
Hi April Carney, glad you are doing well today, have a great day!  April Carney was given information about Medicaid Managed Care team care coordination services as a part of their Healthy [facility] Medicaid benefit. April Carney verbally consented to engagement with the [facility] Managed Care team.   If you are experiencing a medical emergency, please call 911 or report to your local emergency department or urgent care.   If you have a non-emergency medical problem during routine business hours, please contact your provider's office and ask to speak with a nurse.   For questions related to your Healthy [facility] health plan, please call: [phone] or visit the homepage here: [email or web address]  If you would like to schedule transportation through your Healthy [facility] plan, please call the following number at least 2 days in advance of your appointment: [phone]  For information about your ride after you set it up, call Ride Assist at [phone]. Use this number to activate a Will Call pickup, or if your transportation is late for a scheduled pickup. Use this number, too, if you need to make a change or cancel a previously scheduled reservation.  If you need transportation services right away, call [phone]. The after-hours call center is staffed 24 hours to handle ride assistance and urgent reservation requests (including discharges) 365 days a year. Urgent trips include sick visits, hospital discharge requests and life-sustaining treatment.  Call the [place] at [phone], at any time, 24 hours a day, 7 days a week. If you are in danger or need immediate medical attention call 911.  If you would like help to quit smoking, call 1-800-QUIT-NOW 850-496-1926) OR Espaol: 1-855-Djelo-Ya (4-742-595-6387) o para ms informacin haga clic aqu or Text READY to 200-400 to register via text  April Carney - following are  the goals we discussed in your visit today:   Goals Addressed    Priority: High  Note:   Timeframe:  Long-Range Goal Priority:  High Start Date:       12/25/21                      Expected End Date:    ongoing                  Follow Up Date 05/12/22   - schedule appointment for vaccines needed due to my age or health - schedule recommended health tests (blood work, mammogram, colonoscopy, pap test) - schedule and keep appointment for annual check-up    Why is this important?   Screening tests can find diseases early when they are easier to treat.  Your doctor or nurse will talk with you about which tests are important for you.  Getting shots for common diseases like the flu and shingles will help prevent them.  04/08/22:  Patient to schedule an appt with HEME/ONC, eye and dental provider   Patient verbalizes understanding of instructions and care plan provided today and agrees to view in [place]. Active MyChart status and patient understanding of how to access instructions and care plan via MyChart confirmed with patient.     The Managed Medicaid care management team will reach out to the patient again over the next 30 business  days.  The  Patient  has been provided with contact information for the Managed Medicaid care management team and has been advised to call with any health related questions or concerns.   [name] RN, BSN [employer]  Network Care Management Coordinator - Managed Medicaid High Risk [phone]   Following is a copy of your plan of care:  Care Plan : [place]  Updates made by April Medicus, RN since 04/08/2022 12:00 AM     Problem: Knowledge Deficit and Care Coordination Needs Related to Management of HTN, DM, HLD, cerebrovascular  disease, obesity   Priority: High     Long-Range Goal: Development of Plan Of Care to Address Care Coordination Needs and Knowledge Deficits for Management of HTN, DM, HLD,  cerebrovascular disease, obesity   Start Date: 11/08/2021  Expected End Date: 11/09/2022  Note:   Current Barriers:  Knowledge Deficits related to plan of care for management of HTN, HLD, DMII, and cerebrovascular disease and obesity  04/08/22:  No complaints today, left bunionectomy healed and is wearing shoe.  Patient to schedule an appt with heme/onc, dental and eye provider.  BP and BG WNL.   RNCM Clinical Goal(s):  Patient will verbalize basic understanding of HTN, HLD, DMII, and obesity and cerebrovascular disease process and self health management plan as evidenced by no hospital admissions or ED visits related to complications of chronic health issues  and meeting treatment targets for chronic health issues through collaboration with RN Care manager, provider, and care team.  Interventions: Inter-disciplinary care team collaboration (see longitudinal plan of care) Evaluation of current treatment plan related to  self management and patient's adherence to plan as established by provider Ensured patient received dental and ophthalmologic resources from a community care guide.  Ensured patient received written information via mail on obstructive sleep apnea and Healthy [employer] Program and HLD , ADA DM booklet and planning healthy meals brochure Collaborated with LCSW for depression and referral LCSW referral for depression and resources-completed.  Diabetes:  (Status: Goal on Track (progressing): YES.) Long Term Goal - patient reports majority of home monitored CBGs are meeting treatment targets  Lab Results  Component Value Date   HGBA1C 6.5 (H) 09/27/2021   Hgb A1C-6.1 on 02/10/22 Assessed patient's understanding of A1C goal: <6.5%, reviewed A1C target Provided education to patient about basic DM disease process; Reviewed medications with patient and discussed importance of medication adherence;        Counseled on importance of regular laboratory monitoring as prescribed;         Discussed plans with patient for ongoing care management follow up and provided patient with direct contact information for care management team;      Assessed frequency and values of self monitored CBGs and reviewed targets for pre and post meal Ensured patient received American Diabetes Association Booklet on managing diabetes and Planning Healthy Meals brochure that was mailed to her  Hypertension: (Status: Goal on Track (progressing): YES.) Long Term Goal  Last practice recorded BP readings:  BP Readings from Last 3 Encounters:  10/06/21 124/87  09/27/21 125/86  09/17/21 (!) 150/97  Most recent eGFR/CrCl: No results found for: EGFR  No components found for: CRCL  Evaluation of current treatment plan related to hypertension self management and patient's adherence to plan as established by provider;   Reviewed medications with patient and discussed importance of compliance;  Discussed plans with patient for ongoing care management follow up and provided patient with direct contact information for care management team; Reminded patient she may obtain an upper arm home BP monitor to replace her wrist monitor by asking her primary care provider to fax a Rx to [place] Ensured patient received written  health information on HTN that was mailed to her  Stroke:  (Status:Goal on track:  Yes.) Long Term Goal- patient denies new neurological symptoms, reports good medication taking behavior  Reviewed Importance of taking all medications as prescribed Reviewed Importance of attending all scheduled provider appointments  Ensured patient received  written education via mail on stroke prevention  Weight Loss Interventions:  (Status:  New goal.) Long Term Goal Wt Readings from Last 3 Encounters:  10/06/21 275 lb (124.7 kg)  09/27/21 276 lb (125.2 kg)  07/06/21 275 lb (124.7 kg)  Provided patient and/or caregiver with contact information about weight watchers program offered through her  Healthy [employer] Medicaid health plan (community resource or dietician)  Asked permission to discuss weight management with patient then provided health plan weight management information and instructed patient on steps to take to get enrolled in the  program  if she is interested  Hyperlipidemia Interventions:  (Status:  New goal.) Long Term Goal Lab Results  Component Value Date   CHOL 150 09/27/2021   HDL 29 (L) 09/27/2021   LDLCALC 84 09/27/2021   TRIG 216 (H) 09/27/2021   CHOLHDL 5.2 (H) 09/27/2021      Medication review performed; medication list updated in electronic medical record.  Ensured patient received written health information on preventing high cholesterol in the mail  Patient Goals/Self-Care Activities: Take medications as prescribed   Attend all scheduled provider appointments Call pharmacy for medication refills 3-7 days in advance of running out of medications Perform all self care activities independently  Perform IADL's (shopping, preparing meals, housekeeping, managing finances) independently Call provider office for new concerns or questions

## 2022-04-28 ENCOUNTER — Ambulatory Visit (INDEPENDENT_AMBULATORY_CARE_PROVIDER_SITE_OTHER): Payer: Medicaid Other

## 2022-04-28 ENCOUNTER — Ambulatory Visit (INDEPENDENT_AMBULATORY_CARE_PROVIDER_SITE_OTHER): Payer: Medicaid Other | Admitting: Podiatry

## 2022-04-28 DIAGNOSIS — M21612 Bunion of left foot: Secondary | ICD-10-CM

## 2022-04-28 NOTE — Progress Notes (Signed)
Subjective:  41 year old female presents status post left foot bunionectomy performed on February 26, 2022.  She states that she is back to regular shoe and doing well.  No pain.  No recurrence of the gout that she also reports.  No recent injuries.  No fevers or chills.  No other concerns.     Objective: AAO x3, NAD DP/PT pulses palpable bilaterally, CRT less than 3 seconds Status post left foot bunionectomy with incision well coapted incision well healed scar is formed.  There is slight edema but there is no erythema or warmth.  No pain on exam.  No pain with imaging range of motion.  No other areas of discomfort.  Toes in rectus position. No pain with calf compression, swelling, warmth, erythema  Assessment: Status post left [place] Bunionectomy  Plan: -All treatment options discussed with the patient including all alternatives, risks, complications.  -X-rays obtained reviewed.  Status post first metatarsal ostomy with hardware intact with any complicating factors.  Also it is still noted on the dorsal aspect but otherwise appears to be healing.  Hardware intact. -She is back to wearing regular shoes.  Continue supportive shoe gear.  Gradually increase activity level as tolerated.  Continue ice and elevate as well as compression over the residual edema.  Follow-up in 2 months if needed or sooner if any issues were to arise.  Otherwise if she is doing well she can follow-up as needed.  Trula Slade DPM

## 2022-04-29 ENCOUNTER — Encounter: Payer: Medicaid Other | Admitting: Podiatry

## 2022-05-10 ENCOUNTER — Other Ambulatory Visit: Payer: Self-pay | Admitting: Podiatry

## 2022-05-13 ENCOUNTER — Other Ambulatory Visit: Payer: Self-pay | Admitting: Obstetrics and Gynecology

## 2022-05-13 NOTE — Patient Outreach (Signed)
Care Coordination  05/13/2022  April Carney 1981-02-22 [id number]   Medicaid Managed Care   Unsuccessful Outreach Note  05/13/2022 Name: April Carney MRN: [id number] DOB: Dec 23, 1980  Referred by: Wilburt Finlay, MD Reason for referral : High Risk Managed Medicaid (Unsuccessful telephone outreach)   An unsuccessful telephone outreach was attempted today. The patient was referred to the case management team for assistance with care management and care coordination.   Follow Up Plan: The care management team will reach out to the patient again over the next 30 business  days.   [name] RN, BSN [place]  Triad Curator - Managed Medicaid High Risk [phone]

## 2022-05-13 NOTE — Patient Instructions (Signed)
Hi Ms. Dobosz, sorry to have missed you today- as a part of your Medicaid benefit, you are eligible for care management and care coordination services at no cost or copay. I was unable to reach you by phone today but would be happy to help you with your health related needs. Please feel free to call me at [phone].  A member of the Managed Medicaid care management team will reach out to you again over the next 30 business  days.   [name] RN, BSN [place]  Triad Curator - Managed Medicaid High Risk [phone]

## 2022-05-24 DIAGNOSIS — H5213 Myopia, bilateral: Secondary | ICD-10-CM | POA: Diagnosis not present

## 2022-06-11 ENCOUNTER — Other Ambulatory Visit: Payer: Medicaid Other | Admitting: Obstetrics and Gynecology

## 2022-06-11 ENCOUNTER — Encounter: Payer: Self-pay | Admitting: Obstetrics and Gynecology

## 2022-06-11 NOTE — Patient Instructions (Signed)
Hi April Carney, [place] to catch up with you today-have a great day!!  April Carney  April Carney was given information about Medicaid Managed Care team care coordination services as a part of their Healthy [facility] Medicaid benefit. April Carney verbally consented to engagement with the [facility] Managed Care team.   If you are experiencing a medical emergency, please call 911 or report to your local emergency department or urgent care.   If you have a non-emergency medical problem during routine business hours, please contact your provider's office and ask to speak with a nurse.   For questions related to your Healthy [facility] health plan, please call: [phone] or visit the homepage here: [email or web address]  If you would like to schedule transportation through your Healthy [facility] plan, please call the following number at least 2 days in advance of your appointment: [phone]  For information about your ride after you set it up, call Ride Assist at [phone]. Use this number to activate a Will Call pickup, or if your transportation is late for a scheduled pickup. Use this number, too, if you need to make a change or cancel a previously scheduled reservation.  If you need transportation services right away, call [phone]. The after-hours call center is staffed 24 hours to handle ride assistance and urgent reservation requests (including discharges) 365 days a year. Urgent trips include sick visits, hospital discharge requests and life-sustaining treatment.  Call the [place] at [phone], at any time, 24 hours a day, 7 days a week. If you are in danger or need immediate medical attention call 911.  If you would like help to quit smoking, call 1-800-QUIT-NOW 810-156-8504) OR Espaol: 1-855-Djelo-Ya (2-423-536-1443) o para ms informacin haga clic aqu or Text READY to 200-400 to register via text  [name] -  following are the goals we discussed in your visit today:   Goals Addressed    Timeframe:  Long-Range Goal Priority:  High Start Date:       12/25/21                      Expected End Date:    ongoing                  Follow Up Date 07/14/22   - schedule appointment for vaccines needed due to my age or health - schedule recommended health tests (blood work, mammogram, colonoscopy, pap test) - schedule and keep appointment for annual check-up    Why is this important?   Screening tests can find diseases early when they are easier to treat.  Your doctor or nurse will talk with you about which tests are important for you.  Getting shots for common diseases like the flu and shingles will help prevent them.  06/11/22:  Patient with recent eye appt-has Podiatry appt 12/4.  Patient verbalizes understanding of instructions and care plan provided today and agrees to view in [place]. Active MyChart status and patient understanding of how to access instructions and care plan via MyChart confirmed with patient.     The Managed Medicaid care management team will reach out to the patient again over the next 30 business  days.  The  Patient  has been provided with contact information for the Managed Medicaid care management team and has been advised to call with any health related questions or concerns.   April Raider RN, BSN [place]  Triad Curator - Managed Medicaid  High Risk [phone]   Following is a copy of your plan of care:  Care Plan : [place]  Updates made by April Medicus, RN since 06/11/2022 12:00 AM     Problem: Knowledge Deficit and Care Coordination Needs Related to Management of HTN, DM, HLD, cerebrovascular  disease, obesity   Priority: High     Long-Range Goal: Development of Plan Of Care to Address Care Coordination Needs and Knowledge Deficits for Management of HTN, DM, HLD, cerebrovascular disease, obesity    Start Date: 11/08/2021  Expected End Date: 11/09/2022  Note:   Current Barriers:  Knowledge Deficits related to plan of care for management of HTN, HLD, DMII, and cerebrovascular disease and obesity  06/11/22:  Patient with no complaints today-left foot healed.  Recent eye appt, needs to schedule HEME f/u and dental appt.  Blood sugars WNL. RNCM Clinical Goal(s):  Patient will verbalize basic understanding of HTN, HLD, DMII, and obesity and cerebrovascular disease process and self health management plan as evidenced by no hospital admissions or ED visits related to complications of chronic health issues  and meeting treatment targets for chronic health issues through collaboration with RN Care manager, provider, and care team.  Interventions: Inter-disciplinary care team collaboration (see longitudinal plan of care) Evaluation of current treatment plan related to  self management and patient's adherence to plan as established by provider Ensured patient received dental and ophthalmologic resources from a community care guide.  Ensured patient received written information via mail on obstructive sleep apnea and Healthy [employer] Program and HLD , ADA DM booklet and planning healthy meals brochure Collaborated with LCSW for depression and referral LCSW referral for depression and resources-completed.  Diabetes:  (Status: Goal on Track (progressing): YES.) Long Term Goal - patient reports majority of home monitored CBGs are meeting treatment targets  Lab Results  Component Value Date   HGBA1C 6.5 (H) 09/27/2021   Hgb A1C-6.1 on 02/10/22 Assessed patient's understanding of A1C goal: <6.5%, reviewed A1C target Provided education to patient about basic DM disease process; Reviewed medications with patient and discussed importance of medication adherence;        Counseled on importance of regular laboratory monitoring as prescribed;        Discussed plans with patient for ongoing care management  follow up and provided patient with direct contact information for care management team;      Assessed frequency and values of self monitored CBGs and reviewed targets for pre and post meal Ensured patient received American Diabetes Association Booklet on managing diabetes and Planning Healthy Meals brochure that was mailed to her  Hypertension: (Status: Goal on Track (progressing): YES.) Long Term Goal  Last practice recorded BP readings:  BP Readings from Last 3 Encounters:  10/06/21 124/87  09/27/21 125/86  09/17/21 (!) 150/97  Most recent eGFR/CrCl: No results found for: EGFR  No components found for: CRCL  Evaluation of current treatment plan related to hypertension self management and patient's adherence to plan as established by provider;   Reviewed medications with patient and discussed importance of compliance;  Discussed plans with patient for ongoing care management follow up and provided patient with direct contact information for care management team; Reminded patient she may obtain an upper arm home BP monitor to replace her wrist monitor by asking her primary care provider to fax a Rx to [place] Ensured patient received written health information on HTN that was mailed to her  Stroke:  (Status:Goal  on track:  Yes.) Long Term Goal- patient denies new neurological symptoms, reports good medication taking behavior  Reviewed Importance of taking all medications as prescribed Reviewed Importance of attending all scheduled provider appointments  Ensured patient received  written education via mail on stroke prevention  Weight Loss Interventions:  (Status:  New goal.) Long Term Goal Wt Readings from Last 3 Encounters:  10/06/21 275 lb (124.7 kg)  09/27/21 276 lb (125.2 kg)  07/06/21 275 lb (124.7 kg)  Provided patient and/or caregiver with contact information about weight watchers program offered through her Healthy [employer] Medicaid health plan (community resource  or dietician)  Asked permission to discuss weight management with patient then provided health plan weight management information and instructed patient on steps to take to get enrolled in the  program  if she is interested  Hyperlipidemia Interventions:  (Status:  New goal.) Long Term Goal Lab Results  Component Value Date   CHOL 150 09/27/2021   HDL 29 (L) 09/27/2021   LDLCALC 84 09/27/2021   TRIG 216 (H) 09/27/2021   CHOLHDL 5.2 (H) 09/27/2021      Medication review performed; medication list updated in electronic medical record.  Ensured patient received written health information on preventing high cholesterol in the mail  Patient Goals/Self-Care Activities: Take medications as prescribed   Attend all scheduled provider appointments Call pharmacy for medication refills 3-7 days in advance of running out of medications Perform all self care activities independently  Perform IADL's (shopping, preparing meals, housekeeping, managing finances) independently Call provider office for new concerns or questions

## 2022-06-11 NOTE — Patient Outreach (Signed)
Medicaid Managed Care   Nurse Care Manager Note  06/11/2022 Name:  April Carney MRN:  [id number] DOB:  03/30/1981  April Carney is an 41 y.o. year old female who is a primary patient of Kotturi, Vinay K, MD.  The Medicaid Managed Care Coordination team was consulted for assistance with:    Chronic healthcare management needs, DM, HTN, COPD, OSA, CKD, CHF, HLD, h/o CVA, h/o depression, hypothyroid  April Carney was given information about Medicaid Managed Care Coordination team services today. April Carney Patient agreed to services and verbal consent obtained.  Engaged with patient by telephone for follow up visit in response to provider referral for case management and/or care coordination services.   Assessments/Interventions:  Review of past medical history, allergies, medications, health status, including review of consultants reports, laboratory and other test data, was performed as part of comprehensive evaluation and provision of chronic care management services.  SDOH (Social Determinants of Health) assessments and interventions performed: SDOH Interventions    Flowsheet Row Patient Outreach Telephone from 06/11/2022 in [place] [facility] DEPARTMENT Patient Outreach Telephone from 04/08/2022 in [facility] Community Care Coordination Patient Outreach Telephone from 02/27/2022 in [facility] Community Care Coordination Patient Outreach Telephone from 12/27/2021 in [facility] Community Care Coordination Patient Outreach Telephone from 12/25/2021 in [facility] Community Care Coordination Patient Outreach Telephone from 11/08/2021 in [facility] Community Care Coordination  SDOH Interventions        Food Insecurity Interventions -- -- Intervention Not Indicated -- -- Intervention Not Indicated  Housing Interventions Intervention Not Indicated -- -- -- -- Intervention Not Indicated  Transportation  Interventions -- -- -- -- Intervention Not Indicated Intervention Not Indicated  Utilities Interventions -- Intervention Not Indicated -- -- -- --  Financial Strain Interventions Intervention Not Indicated -- -- -- -- Intervention Not Indicated  Physical Activity Interventions -- -- -- -- Intervention Not Indicated, Other (Comments)  [patient states does not feel like exercising right now] --  Stress Interventions -- -- -- Offered Community Wellness Resources, Provide Counseling -- Intervention Not Indicated  Social Connections Interventions -- Intervention Not Indicated -- -- -- --       Care Plan  Allergies  Allergen Reactions   Ammonium-Containing Compounds Other (See Comments)    Turns skin beet red and swelling at site    Medications Reviewed Today     Reviewed by Craft, Terri G, RN (Registered Nurse) on 06/11/22 at 1041  Med List Status: <None>   Medication Order Taking? Sig Documenting Provider Last Dose Status Informant  Accu-Chek FastClix Lancets MISC [id number] No Use to check blood glucose twice a day Corum, Lisa L, MD 02/25/2022 Active Spouse/Significant Other  acetaminophen (TYLENOL) 325 MG tablet [id number] No Take 650 mg by mouth every 6 (six) hours as needed.  [provider] 02/24/2022 Active Spouse/Significant Other, Self  allopurinol (ZYLOPRIM) 100 MG tablet [id number]  Take 1 tablet by mouth once daily Wagoner, Matthew R, DPM  Active   apixaban (ELIQUIS) 5 MG TABS tablet [id number]  Take 1 tablet (5 mg total) by mouth 2 (two) times daily. McCue, Jessica, NP  Active   Ascorbic Acid (VITAMIN C) 100 MG tablet [id number] No Take 100 mg by mouth daily. [provider] 02/22/2022 Active Self           Med Note (SEXTON, DENISE P   Wed Feb 19, 2022 11:28 AM) .  atorvastatin (LIPITOR) 40 MG tablet [id number] No Take 1 tablet   by mouth once daily  Patient taking differently: Take 40 mg by mouth daily.   Frann Rider, NP 02/26/2022 0730 Active Self  blood glucose  meter kit and supplies KIT [id number] No Dispense based on patient and insurance preference. Use up to four times daily as directed. (FOR ICD-9 250.00, 250.01). Maryruth Hancock, MD 02/25/2022 Active Spouse/Significant Other  cephALEXin (KEFLEX) 500 MG capsule [id number]  Take 1 capsule (500 mg total) by mouth 3 (three) times daily. Trula Slade, DPM  Active   cetirizine (ZYRTEC) 10 MG tablet [id number] No Take 10 mg by mouth daily. [provider] 02/26/2022 0730 Active Self  diphenhydrAMINE (BENADRYL) 25 MG tablet [id number] No Take 25 mg by mouth at bedtime as needed for sleep. [provider] Past Month Active Self  Dulaglutide 1.5 MG/0.5ML SOPN [id number] No Inject 1.5 mg into the skin once a week. [provider] 02/16/2022 Active Self           Med Note Broadus John, [name]   Fri Nov 08, 2021 11:44 AM) Injects Sunday  etonogestrel (NEXPLANON) 68 MG IMPL implant [id number] No 1 each by Subdermal route once. [provider] Taking Active Self           Med Note Burnard Leigh   Wed Feb 19, 2022 11:26 AM) Per pt Inserted 10/ 2021  FLUoxetine (PROZAC) 20 MG capsule [id number] No Take three pills each morning as directed.  Patient taking differently: Take 20 mg by mouth daily.   [name], MD 02/26/2022 0730 Active Self  glucose blood (ACCU-CHEK GUIDE) test strip [id number] No Use to check glucose twice a day Maryruth Hancock, MD 02/25/2022 Active Spouse/Significant Other  levothyroxine (SYNTHROID) 175 MCG tablet [id number] No Take 175 mcg by mouth daily. [provider] 02/26/2022 0730 Active Self  lisinopril (ZESTRIL) 10 MG tablet [id number] No Take 10 mg by mouth at bedtime. [provider] 02/25/2022 Active Self  metFORMIN (GLUCOPHAGE) 1000 MG tablet [id number] No Take 1,000 mg by mouth 2 (two) times daily with a meal. [provider] 02/25/2022 Active Self  oxyCODONE-acetaminophen (PERCOCET) 5-325 MG tablet [id number]  Take 1-2 tablets by mouth every 6  (six) hours as needed for severe pain. Trula Slade, DPM  Active   promethazine (PHENERGAN) 25 MG tablet [id number]  Take 1 tablet (25 mg total) by mouth every 8 (eight) hours as needed for nausea or vomiting. Trula Slade, DPM  Active   zinc gluconate 50 MG tablet [id number] No Take 50 mg by mouth daily. [provider] 02/25/2022 Active Self           Med Note Linus Mako, DENISE P   Wed Feb 19, 2022 11:27 AM) .            Patient Active Problem List   Diagnosis Date Noted   Bunion, left 03/15/2022   History of CVA (cerebrovascular accident) 07/19/2020   History of pulmonary embolus (PE) 07/19/2020   Iron deficiency anemia due to chronic blood loss 07/19/2020   COVID-19    Stroke (cerebrum) ([place]) 07/09/2020   CVA (cerebral vascular accident) ([place]) 07/09/2020   Cerebrovascular accident (CVA) ([place]) 07/09/2020   Nexplanon insertion 05/25/2020   Anemia 08/22/2019   Submandibular sialoadenitis 07/15/2018   Elevated liver function tests 06/02/2017   Superficial fungus infection of skin 07/01/2016   Essential hypertension 07/01/2016   Diabetes mellitus ([place]) 12/12/2015   Hyperlipidemia 12/12/2015   Situational depression 12/11/2015   Family h/o Biliary atresia 04/24/2015   History  of preterm delivery 04/24/2015   History of cesarean section 04/24/2015   Obesity 04/24/2015   Hypothyroidism 04/24/2015   Conditions to be addressed/monitored per PCP order:  Chronic healthcare management needs, DM, HTN, COPD, OSA, CKD, CHF, HLD, h/o CVA, h/o depression, hypothyroid  Care Plan : RN Care Manager Plan Of Care  Updates made by Craft, Terri G, RN since 06/11/2022 12:00 AM     Problem: Knowledge Deficit and Care Coordination Needs Related to Management of HTN, DM, HLD, cerebrovascular  disease, obesity   Priority: High     Long-Range Goal: Development of Plan Of Care to Address Care Coordination Needs and Knowledge Deficits for Management of HTN, DM, HLD, cerebrovascular  disease, obesity   Start Date: 11/08/2021  Expected End Date: 11/09/2022  Note:   Current Barriers:  Knowledge Deficits related to plan of care for management of HTN, HLD, DMII, and cerebrovascular disease and obesity  06/11/22:  Patient with no complaints today-left foot healed.  Recent eye appt, needs to schedule HEME f/u and dental appt.  Blood sugars WNL. RNCM Clinical Goal(s):  Patient will verbalize basic understanding of HTN, HLD, DMII, and obesity and cerebrovascular disease process and self health management plan as evidenced by no hospital admissions or ED visits related to complications of chronic health issues  and meeting treatment targets for chronic health issues through collaboration with RN Care manager, provider, and care team.  Interventions: Inter-disciplinary care team collaboration (see longitudinal plan of care) Evaluation of current treatment plan related to  self management and patient's adherence to plan as established by provider Ensured patient received dental and ophthalmologic resources from a community care guide.  Ensured patient received written information via mail on obstructive sleep apnea and Healthy [employer] and HLD , ADA DM booklet and planning healthy meals brochure Collaborated with April Carney for depression and referral April Carney referral for depression and resources-completed.  Diabetes:  (Status: Goal on Track (progressing): YES.) Long Term Goal - patient reports majority of home monitored CBGs are meeting treatment targets  Lab Results  Component Value Date   HGBA1C 6.5 (H) 09/27/2021   Hgb A1C-6.1 on 02/10/22 Assessed patient's understanding of A1C goal: <6.5%, reviewed A1C target Provided education to patient about basic DM disease process; Reviewed medications with patient and discussed importance of medication adherence;        Counseled on importance of regular laboratory monitoring as prescribed;        Discussed plans with patient for  ongoing care management follow up and provided patient with direct contact information for care management team;      Assessed frequency and values of self monitored CBGs and reviewed targets for pre and post meal Ensured patient received American Diabetes Association Booklet on managing diabetes and Planning Healthy Meals brochure that was mailed to her  Hypertension: (Status: Goal on Track (progressing): YES.) Long Term Goal  Last practice recorded BP readings:  BP Readings from Last 3 Encounters:  10/06/21 124/87  09/27/21 125/86  09/17/21 (!) 150/97  Most recent eGFR/CrCl: No results found for: EGFR  No components found for: CRCL  Evaluation of current treatment plan related to hypertension self management and patient's adherence to plan as established by provider;   Reviewed medications with patient and discussed importance of compliance;  Discussed plans with patient for ongoing care management follow up and provided patient with direct contact information for care management team; Reminded patient she may obtain an upper arm home BP monitor to replace her wrist   monitor by asking her primary care provider to fax a Rx to [facility] Ensured patient received written health information on HTN that was mailed to her  Stroke:  (Status:Goal on track:  Yes.) Long Term Goal- patient denies new neurological symptoms, reports good medication taking behavior  Reviewed Importance of taking all medications as prescribed Reviewed Importance of attending all scheduled provider appointments  Ensured patient received  written education via mail on stroke prevention  Weight Loss Interventions:  (Status:  New goal.) Long Term Goal Wt Readings from Last 3 Encounters:  10/06/21 275 lb (124.7 kg)  09/27/21 276 lb (125.2 kg)  07/06/21 275 lb (124.7 kg)  Provided patient and/or caregiver with contact information about weight watchers program offered through her Healthy [employer] Managed Medicaid health  plan (community resource or dietician)  Asked permission to discuss weight management with patient then provided health plan weight management information and instructed patient on steps to take to get enrolled in the  program  if she is interested  Hyperlipidemia Interventions:  (Status:  New goal.) Long Term Goal Lab Results  Component Value Date   CHOL 150 09/27/2021   HDL 29 (L) 09/27/2021   LDLCALC 84 09/27/2021   TRIG 216 (H) 09/27/2021   CHOLHDL 5.2 (H) 09/27/2021      Medication review performed; medication list updated in electronic medical record.  Ensured patient received written health information on preventing high cholesterol in the mail  Patient Goals/Self-Care Activities: Take medications as prescribed   Attend all scheduled provider appointments Call pharmacy for medication refills 3-7 days in advance of running out of medications Perform all self care activities independently  Perform IADL's (shopping, preparing meals, housekeeping, managing finances) independently Call provider office for new concerns or questions    Long-Range Goal: Establish Plan of Care for Chronic Disease Management Needs   Priority: High  Note:   Timeframe:  Long-Range Goal Priority:  High Start Date:       12/25/21                      Expected End Date:    ongoing                  Follow Up Date 07/14/22   - schedule appointment for vaccines needed due to my age or health - schedule recommended health tests (blood work, mammogram, colonoscopy, pap test) - schedule and keep appointment for annual check-up    Why is this important?   Screening tests can find diseases early when they are easier to treat.  Your doctor or nurse will talk with you about which tests are important for you.  Getting shots for common diseases like the flu and shingles will help prevent them.  06/11/22:  Patient with recent eye appt-has Podiatry appt 12/4.   Follow Up:  Patient agrees to Care Plan and  Follow-up.  Plan: The Managed Medicaid care management team will reach out to the patient again over the next 30 business  days. and The  Patient has been provided with contact information for the Managed Medicaid care management team and has been advised to call with any health related questions or concerns.  Date/time of next scheduled RN care management/care coordination outreach:  07/14/22 at 1230. 

## 2022-06-16 DIAGNOSIS — H5213 Myopia, bilateral: Secondary | ICD-10-CM | POA: Diagnosis not present

## 2022-06-16 DIAGNOSIS — H52223 Regular astigmatism, bilateral: Secondary | ICD-10-CM | POA: Diagnosis not present

## 2022-06-30 ENCOUNTER — Encounter: Payer: Medicaid Other | Admitting: Podiatry

## 2022-07-14 ENCOUNTER — Ambulatory Visit
Admission: EM | Admit: 2022-07-14 | Discharge: 2022-07-14 | Disposition: A | Discharge status: Home / Self Care | Payer: Medicaid Other | Attending: Nurse Practitioner | Admitting: Nurse Practitioner

## 2022-07-14 ENCOUNTER — Encounter: Payer: Self-pay | Admitting: Obstetrics and Gynecology

## 2022-07-14 ENCOUNTER — Other Ambulatory Visit: Payer: Medicaid Other | Admitting: Obstetrics and Gynecology

## 2022-07-14 DIAGNOSIS — Z1152 Encounter for screening for COVID-19: Secondary | ICD-10-CM | POA: Diagnosis not present

## 2022-07-14 DIAGNOSIS — R059 Cough, unspecified: Secondary | ICD-10-CM | POA: Diagnosis not present

## 2022-07-14 DIAGNOSIS — Z79899 Other long term (current) drug therapy: Secondary | ICD-10-CM | POA: Insufficient documentation

## 2022-07-14 DIAGNOSIS — J029 Acute pharyngitis, unspecified: Secondary | ICD-10-CM | POA: Insufficient documentation

## 2022-07-14 DIAGNOSIS — J069 Acute upper respiratory infection, unspecified: Secondary | ICD-10-CM | POA: Insufficient documentation

## 2022-07-14 LAB — POCT RAPID STREP A (OFFICE): Rapid Strep A Screen: NEGATIVE

## 2022-07-14 MED ORDER — PROMETHAZINE-DM 6.25-15 MG/5ML PO SYRP: 5.0000 mL | ORAL_SOLUTION | Freq: Four times a day (QID) | ORAL | 0 refills | Status: DC | PRN

## 2022-07-14 MED ORDER — FLUTICASONE PROPIONATE 50 MCG/ACT NA SUSP: 2.0000 | Freq: Every day | NASAL | 0 refills | Status: AC

## 2022-07-14 NOTE — Patient Instructions (Signed)
Hi Ms. Stauffer, thanks for speaking with me-I hope you feel better soon!  Ms. Kepple was given information about Medicaid Managed Care team care coordination services as a part of their Healthy [facility] Medicaid benefit. Day Aurea Graff verbally consented to engagement with the [facility] Managed Care team.   If you are experiencing a medical emergency, please call 911 or report to your local emergency department or urgent care.   If you have a non-emergency medical problem during routine business hours, please contact your provider's office and ask to speak with a nurse.   For questions related to your Healthy [facility] health plan, please call: [phone] or visit the homepage here: [email or web address]  If you would like to schedule transportation through your Healthy [facility] plan, please call the following number at least 2 days in advance of your appointment: [phone]  For information about your ride after you set it up, call Ride Assist at [phone]. Use this number to activate a Will Call pickup, or if your transportation is late for a scheduled pickup. Use this number, too, if you need to make a change or cancel a previously scheduled reservation.  If you need transportation services right away, call [phone]. The after-hours call center is staffed 24 hours to handle ride assistance and urgent reservation requests (including discharges) 365 days a year. Urgent trips include sick visits, hospital discharge requests and life-sustaining treatment.  Call the [facility] at [phone], at any time, 24 hours a day, 7 days a week. If you are in danger or need immediate medical attention call 911.  If you would like help to quit smoking, call 1-800-QUIT-NOW 678 131 1099) OR Espaol: 1-855-Djelo-Ya (6-720-947-0962) o para ms informacin haga clic aqu or Text READY to 200-400 to register via text  [name] -  following are the goals we discussed in your visit today:   Goals Addressed    Timeframe:  Long-Range Goal Priority:  High Start Date:       12/25/21                      Expected End Date:    ongoing                  Follow Up Date 08/15/22   - schedule appointment for vaccines needed due to my age or health - schedule recommended health tests (blood work, mammogram, colonoscopy, pap test) - schedule and keep appointment for annual check-up    Why is this important?   Screening tests can find diseases early when they are easier to treat.  Your doctor or nurse will talk with you about which tests are important for you.  Getting shots for common diseases like the flu and shingles will help prevent them.  07/14/22:  Patient has dental appt scheduled for Feb.  Needs to schedule HEME/ONC appt.  Patient verbalizes understanding of instructions and care plan provided today and agrees to view in [place]. Active MyChart status and patient understanding of how to access instructions and care plan via MyChart confirmed with patient.     The Managed Medicaid care management team will reach out to the patient again over the next 30 business  days.  The  Patient   has been provided with contact information for the Managed Medicaid care management team and has been advised to call with any health related questions or concerns.   [name] RN, BSN [place]  [facility]  Managed Medicaid High Risk [phone]   Following is a copy of your plan of care:  Care Plan : [place]  Updates made by [name], RN since 07/14/2022 12:00 AM     Problem: Knowledge Deficit and Care Coordination Needs Related to Management of HTN, DM, HLD, cerebrovascular  disease, obesity   Priority: High     Long-Range Goal: Development of Plan Of Care to Address Care Coordination Needs and Knowledge Deficits for Management of HTN, DM, HLD,  cerebrovascular disease, obesity   Start Date: 11/08/2021  Expected End Date: 11/09/2022  Note:   Current Barriers:  Knowledge Deficits related to plan of care for management of HTN, HLD, DMII, and cerebrovascular disease and obesity  07/14/22:  BP and BG WNL.  Patient seen at [facility] this morning  for sore throat-mediations prescribed-? Viral per provider, results pending.   RNCM Clinical Goal(s):  Patient will verbalize basic understanding of HTN, HLD, DMII, and obesity and cerebrovascular disease process and self health management plan as evidenced by no hospital admissions or ED visits related to complications of chronic health issues  and meeting treatment targets for chronic health issues through collaboration with RN Care manager, provider, and care team.  Interventions: Inter-disciplinary care team collaboration (see longitudinal plan of care) Evaluation of current treatment plan related to  self management and patient's adherence to plan as established by provider Ensured patient received dental and ophthalmologic resources from a community care guide.  Ensured patient received written information via mail on obstructive sleep apnea and Healthy [employer] Program and HLD , ADA DM booklet and planning healthy meals brochure Collaborated with LCSW for depression and referral LCSW referral for depression and resources-completed.  Diabetes:  (Status: Goal on Track (progressing): YES.) Long Term Goal - patient reports majority of home monitored CBGs are meeting treatment targets  Lab Results  Component Value Date   HGBA1C 6.5 (H) 09/27/2021   Hgb A1C-6.1 on 02/10/22 Assessed patient's understanding of A1C goal: <6.5%, reviewed A1C target Provided education to patient about basic DM disease process; Reviewed medications with patient and discussed importance of medication adherence;        Counseled on importance of regular laboratory monitoring as prescribed;        Discussed plans  with patient for ongoing care management follow up and provided patient with direct contact information for care management team;      Assessed frequency and values of self monitored CBGs and reviewed targets for pre and post meal Ensured patient received American Diabetes Association Booklet on managing diabetes and Planning Healthy Meals brochure that was mailed to her  Hypertension: (Status: Goal on Track (progressing): YES.) Long Term Goal  Last practice recorded BP readings:  BP Readings from Last 3 Encounters:  10/06/21 124/87  09/27/21 125/86  09/17/21 (!) 150/97  07/14/22         123/89 Most recent eGFR/CrCl: No results found for: EGFR  No components found for: CRCL  Evaluation of current treatment plan related to hypertension self management and patient's adherence to plan as established by provider;   Reviewed medications with patient and discussed importance of compliance;  Discussed plans with patient for ongoing care management follow up and provided patient with direct contact information for care management team; Reminded patient she may obtain an upper arm home BP monitor to replace her wrist monitor by asking her primary care provider to fax a Rx to [place] Ensured patient received written  health information on HTN that was mailed to her  Stroke:  (Status:Goal on track:  Yes.) Long Term Goal- patient denies new neurological symptoms, reports good medication taking behavior  Reviewed Importance of taking all medications as prescribed Reviewed Importance of attending all scheduled provider appointments  Ensured patient received  written education via mail on stroke prevention  Weight Loss Interventions:  (Status:  New goal.) Long Term Goal Wt Readings from Last 3 Encounters:  10/06/21 275 lb (124.7 kg)  09/27/21 276 lb (125.2 kg)  07/06/21 275 lb (124.7 kg)  Provided patient and/or caregiver with contact information about weight watchers program offered through  her Healthy [employer] Medicaid health plan (community resource or dietician)  Asked permission to discuss weight management with patient then provided health plan weight management information and instructed patient on steps to take to get enrolled in the  program  if she is interested  Hyperlipidemia Interventions:  (Status:  New goal.) Long Term Goal Lab Results  Component Value Date   CHOL 150 09/27/2021   HDL 29 (L) 09/27/2021   LDLCALC 84 09/27/2021   TRIG 216 (H) 09/27/2021   CHOLHDL 5.2 (H) 09/27/2021      Medication review performed; medication list updated in electronic medical record.  Ensured patient received written health information on preventing high cholesterol in the mail  Patient Goals/Self-Care Activities: Take medications as prescribed   Attend all scheduled provider appointments Call pharmacy for medication refills 3-7 days in advance of running out of medications Perform all self care activities independently  Perform IADL's (shopping, preparing meals, housekeeping, managing finances) independently Call provider office for new concerns or questions  Patient to schedule HEME/ONC appt

## 2022-07-14 NOTE — Discharge Instructions (Addendum)
The rapid strep test was negative, COVID/flu and throat culture are pending.  You will be contacted if the pending test results are positive.  If the COVID test is positive, you are a candidate to receive molnupiravir. Take medication as prescribed. Increase fluids and allow for plenty of rest. Recommend Tylenol as needed for pain, fever, or general discomfort. Recommend throat lozenges, Chloraseptic or honey to help with throat pain. Warm salt water gargles 3-4 times daily to help with throat pain or discomfort. Recommend a diet with soft foods to include soups, broths, puddings, yogurt, Jell-O's, or popsicles until symptoms improve. Please be advised that a viral illness can last anywhere from 10 to 14 days.  If your symptoms exist beyond this point or if they suddenly worsen, please follow-up with your primary care physician for further evaluation. Follow-up as needed.

## 2022-07-14 NOTE — Patient Outreach (Signed)
Medicaid Managed Care   Nurse Care Manager Note  07/14/2022 Name:  April Carney MRN:  [id number] DOB:  [birth year]  April Carney is an 41 y.o. year old female who is a primary patient of Kotturi, Tyler Deis, MD.  The Medicaid Managed Care Coordination team was consulted for assistance with:    Chronic healthcare management needs, DM, HTN, CKD, CHF, HLD, h/o CVA, h/o depression, hypothyroid  [name] was given information about Medicaid Managed Care Coordination team services today. April Carney Patient agreed to services and verbal consent obtained.  Engaged with patient by telephone for follow up visit in response to provider referral for case management and/or care coordination services.   Assessments/Interventions:  Review of past medical history, allergies, medications, health status, including review of consultants reports, laboratory and other test data, was performed as part of comprehensive evaluation and provision of chronic care management services.  SDOH (Social Determinants of Health) assessments and interventions performed: SDOH Interventions    Flowsheet Row Patient Outreach Telephone from 06/11/2022 in [place] Patient Outreach Telephone from 04/08/2022 in [place] Patient Outreach Telephone from 02/27/2022 in [place] Patient Outreach Telephone from 12/27/2021 in [place] Patient Outreach Telephone from 12/25/2021 in [place] Patient Outreach Telephone from 11/08/2021 in [place] Coordination  SDOH Interventions        Food Insecurity Interventions -- -- Intervention Not Indicated -- -- Intervention Not Indicated  Housing Interventions Intervention Not Indicated -- -- -- -- Intervention Not Indicated  Transportation Interventions -- --  -- -- Intervention Not Indicated Intervention Not Indicated  Utilities Interventions -- Intervention Not Indicated -- -- -- --  Financial Strain Interventions Intervention Not Indicated -- -- -- -- Intervention Not Indicated  Physical Activity Interventions -- -- -- -- Intervention Not Indicated, Other (Comments)  [patient states does not feel like exercising right now] --  Stress Interventions -- -- -- [employer], Provide Counseling -- Intervention Not Indicated  Social Connections Interventions -- Intervention Not Indicated -- -- -- --      Care Plan  Allergies  Allergen Reactions   Ammonium-Containing Compounds Other (See Comments)    Turns skin beet red and swelling at site    Medications Reviewed Today     Reviewed by [name], RN (Registered Nurse) on 07/14/22 at 1242  Med List Status: <None>   Medication Order Taking? Sig Documenting Provider Last Dose Status Informant  Accu-Chek FastClix Lancets MISC [id number] No Use to check blood glucose twice a day Maryruth Hancock, MD 02/25/2022 Active Spouse/Significant Other  acetaminophen (TYLENOL) 325 MG tablet [id number] No Take 650 mg by mouth every 6 (six) hours as needed.  [provider] 02/24/2022 Active Spouse/Significant Other, Self  allopurinol (ZYLOPRIM) 100 MG tablet [id number]  Take 1 tablet by mouth once daily Trula Slade, DPM  Active   apixaban (ELIQUIS) 5 MG TABS tablet [id number]  Take 1 tablet (5 mg total) by mouth 2 (two) times daily. Frann Rider, NP  Active   Ascorbic Acid (VITAMIN C) 100 MG tablet [id number] No Take 100 mg by mouth daily. [provider] 02/22/2022 Active Self           Med Note Linus Mako, DENISE P   Wed Feb 19, 2022 11:28 AM) .  atorvastatin (LIPITOR) 40 MG tablet [id number] No Take 1 tablet by mouth once  daily  Patient taking differently: Take 40 mg by mouth daily.   Frann Rider, NP 02/26/2022 0730 Active Self  blood glucose meter kit and supplies KIT  [id number] No Dispense based on patient and insurance preference. Use up to four times daily as directed. (FOR ICD-9 250.00, 250.01). Maryruth Hancock, MD 02/25/2022 Active Spouse/Significant Other  cephALEXin (KEFLEX) 500 MG capsule [id number]  Take 1 capsule (500 mg total) by mouth 3 (three) times daily. Trula Slade, DPM  Active   cetirizine (ZYRTEC) 10 MG tablet [id number] No Take 10 mg by mouth daily. [provider] 02/26/2022 0730 Active Self  diphenhydrAMINE (BENADRYL) 25 MG tablet [id number] No Take 25 mg by mouth at bedtime as needed for sleep. [provider] Past Month Active Self  Dulaglutide 1.5 MG/0.5ML SOPN [id number] No Inject 1.5 mg into the skin once a week. [provider] 02/16/2022 Active Self           Med Note April Carney, [name]   Fri Nov 08, 2021 11:44 AM) Injects Sunday  etonogestrel (NEXPLANON) 68 MG IMPL implant [id number] No 1 each by Subdermal route once. [provider] Taking Active Self           Med Note April Carney   Wed Feb 19, 2022 11:26 AM) Per pt Inserted 10/ 2021  FLUoxetine (PROZAC) 20 MG capsule [id number] No Take three pills each morning as directed.  Patient taking differently: Take 20 mg by mouth daily.   April Macadam, MD 02/26/2022 0730 Active Self  fluticasone (FLONASE) 50 MCG/ACT nasal spray [id number]  Place 2 sprays into both nostrils daily. Leath-Warren, Alda Lea, NP  Active   glucose blood (ACCU-CHEK GUIDE) test strip [id number] No Use to check glucose twice a day Maryruth Hancock, MD 02/25/2022 Active Spouse/Significant Other  levothyroxine (SYNTHROID) 175 MCG tablet [id number] No Take 175 mcg by mouth daily. [provider] 02/26/2022 0730 Active Self  lisinopril (ZESTRIL) 10 MG tablet [id number] No Take 10 mg by mouth at bedtime. [provider] 02/25/2022 Active Self  metFORMIN (GLUCOPHAGE) 1000 MG tablet [id number] No Take 1,000 mg by mouth 2 (two) times daily with a meal. [provider] 02/25/2022  Active Self  oxyCODONE-acetaminophen (PERCOCET) 5-325 MG tablet [id number]  Take 1-2 tablets by mouth every 6 (six) hours as needed for severe pain. Trula Slade, DPM  Active   promethazine (PHENERGAN) 25 MG tablet [id number]  Take 1 tablet (25 mg total) by mouth every 8 (eight) hours as needed for nausea or vomiting. Trula Slade, DPM  Active   promethazine-dextromethorphan (PROMETHAZINE-DM) 6.25-15 MG/5ML syrup [id number]  Take 5 mLs by mouth 4 (four) times daily as needed for cough. Leath-Warren, Alda Lea, NP  Active   zinc gluconate 50 MG tablet [id number] No Take 50 mg by mouth daily. [provider] 02/25/2022 Active Self           Med Note Linus Mako, DENISE P   Wed Feb 19, 2022 11:27 AM) .           Patient Active Problem List   Diagnosis Date Noted   Bunion, left 03/15/2022   History of CVA (cerebrovascular accident) 07/19/2020   History of pulmonary embolus (PE) 07/19/2020   Iron deficiency anemia due to chronic blood loss 07/19/2020   COVID-19    Stroke (cerebrum) ([place]) 07/09/2020   CVA (cerebral vascular accident) ([place]) 07/09/2020   Cerebrovascular accident (CVA) ([place]) 07/09/2020   Nexplanon insertion 05/25/2020   Anemia 08/22/2019   Submandibular  sialoadenitis 07/15/2018   Elevated liver function tests 06/02/2017   Superficial fungus infection of skin 07/01/2016   Essential hypertension 07/01/2016   Diabetes mellitus ([place]) 12/12/2015   Hyperlipidemia 12/12/2015   Situational depression 12/11/2015   Family h/o Biliary atresia 04/24/2015   History of preterm delivery 04/24/2015   History of cesarean section 04/24/2015   Obesity 04/24/2015   Hypothyroidism 04/24/2015   Conditions to be addressed/monitored per PCP order:  Chronic healthcare management needs, DM, HTN, CKD, CHF, HLD, h/o CVA, h/o depression, hypothyroid  Care Plan : [place]  Updates made by [name], RN since 07/14/2022 12:00 AM     Problem: Knowledge Deficit  and Care Coordination Needs Related to Management of HTN, DM, HLD, cerebrovascular  disease, obesity   Priority: High     Long-Range Goal: Development of Plan Of Care to Address Care Coordination Needs and Knowledge Deficits for Management of HTN, DM, HLD, cerebrovascular disease, obesity   Start Date: 11/08/2021  Expected End Date: 11/09/2022  Note:   Current Barriers:  Knowledge Deficits related to plan of care for management of HTN, HLD, DMII, and cerebrovascular disease and obesity  07/14/22:  BP and BG WNL.  Patient seen at [facility] this morning  for sore throat-mediations prescribed-? Viral per provider, results pending.   RNCM Clinical Goal(s):  Patient will verbalize basic understanding of HTN, HLD, DMII, and obesity and cerebrovascular disease process and self health management plan as evidenced by no hospital admissions or ED visits related to complications of chronic health issues  and meeting treatment targets for chronic health issues through collaboration with RN Care manager, provider, and care team.  Interventions: Inter-disciplinary care team collaboration (see longitudinal plan of care) Evaluation of current treatment plan related to  self management and patient's adherence to plan as established by provider Ensured patient received dental and ophthalmologic resources from a community care guide.  Ensured patient received written information via mail on obstructive sleep apnea and Healthy [employer] Program and HLD , ADA DM booklet and planning healthy meals brochure Collaborated with LCSW for depression and referral LCSW referral for depression and resources-completed.  Diabetes:  (Status: Goal on Track (progressing): YES.) Long Term Goal - patient reports majority of home monitored CBGs are meeting treatment targets  Lab Results  Component Value Date   HGBA1C 6.5 (H) 09/27/2021   Hgb A1C-6.1 on 02/10/22 Assessed patient's understanding of A1C goal: <6.5%, reviewed  A1C target Provided education to patient about basic DM disease process; Reviewed medications with patient and discussed importance of medication adherence;        Counseled on importance of regular laboratory monitoring as prescribed;        Discussed plans with patient for ongoing care management follow up and provided patient with direct contact information for care management team;      Assessed frequency and values of self monitored CBGs and reviewed targets for pre and post meal Ensured patient received American Diabetes Association Booklet on managing diabetes and Planning Healthy Meals brochure that was mailed to her  Hypertension: (Status: Goal on Track (progressing): YES.) Long Term Goal  Last practice recorded BP readings:  BP Readings from Last 3 Encounters:  10/06/21 124/87  09/27/21 125/86  09/17/21 (!) 150/97  07/14/22         123/89 Most recent eGFR/CrCl: No results found for: EGFR  No components found for: CRCL  Evaluation of current treatment plan related to hypertension self management and  patient's adherence to plan as established by provider;   Reviewed medications with patient and discussed importance of compliance;  Discussed plans with patient for ongoing care management follow up and provided patient with direct contact information for care management team; Reminded patient she may obtain an upper arm home BP monitor to replace her wrist monitor by asking her primary care provider to fax a Rx to [place] Ensured patient received written health information on HTN that was mailed to her  Stroke:  (Status:Goal on track:  Yes.) Long Term Goal- patient denies new neurological symptoms, reports good medication taking behavior  Reviewed Importance of taking all medications as prescribed Reviewed Importance of attending all scheduled provider appointments  Ensured patient received  written education via mail on stroke prevention  Weight Loss Interventions:   (Status:  New goal.) Long Term Goal Wt Readings from Last 3 Encounters:  10/06/21 275 lb (124.7 kg)  09/27/21 276 lb (125.2 kg)  07/06/21 275 lb (124.7 kg)  Provided patient and/or caregiver with contact information about weight watchers program offered through her Healthy [employer] Medicaid health plan (community resource or dietician)  Asked permission to discuss weight management with patient then provided health plan weight management information and instructed patient on steps to take to get enrolled in the  program  if she is interested  Hyperlipidemia Interventions:  (Status:  New goal.) Long Term Goal Lab Results  Component Value Date   CHOL 150 09/27/2021   HDL 29 (L) 09/27/2021   LDLCALC 84 09/27/2021   TRIG 216 (H) 09/27/2021   CHOLHDL 5.2 (H) 09/27/2021      Medication review performed; medication list updated in electronic medical record.  Ensured patient received written health information on preventing high cholesterol in the mail  Patient Goals/Self-Care Activities: Take medications as prescribed   Attend all scheduled provider appointments Call pharmacy for medication refills 3-7 days in advance of running out of medications Perform all self care activities independently  Perform IADL's (shopping, preparing meals, housekeeping, managing finances) independently Call provider office for new concerns or questions  Patient to schedule HEME/ONC appt   Long-Range Goal: Establish Plan of Care for Chronic Disease Management Needs   Priority: High  Note:   Timeframe:  Long-Range Goal Priority:  High Start Date:       12/25/21                      Expected End Date:    ongoing                  Follow Up Date 08/15/22   - schedule appointment for vaccines needed due to my age or health - schedule recommended health tests (blood work, mammogram, colonoscopy, pap test) - schedule and keep appointment for annual check-up    Why is this important?   Screening tests can  find diseases early when they are easier to treat.  Your doctor or nurse will talk with you about which tests are important for you.  Getting shots for common diseases like the flu and shingles will help prevent them.  07/14/22:  Patient has dental appt scheduled for Feb.  Needs to schedule HEME/ONC appt.   Follow Up:  Patient agrees to Care Plan and Follow-up.  Plan: The Managed Medicaid care management team will reach out to the patient again over the next 30 business  days. and The  Patient has been provided with contact information for the Managed Medicaid care  management team and has been advised to call with any health related questions or concerns.  Date/time of next scheduled RN care management/care coordination outreach: 08/15/22 at 1230

## 2022-07-14 NOTE — ED Provider Notes (Signed)
[facility]    CSN: [id number] Arrival date & time: 07/14/22  0300      History   Chief Complaint Chief Complaint  Patient presents with   Sore Throat    HPI April Carney is a 42 y.o. female.   The history is provided by the patient.   The patient presents for complaints of upper respiratory symptoms that been present over the last several days.  Patient states over the last 24 to 48 hours, she has developed a sore throat.  She complains of cough, nasal congestion, runny nose, and headache.  She states that the cough sore throat is worse at night and in the morning, but improves throughout the day.  She denies fever, wheezing, shortness of breath, or difficulty breathing.  She reports she has been taking over-the-counter Tylenol for her symptoms.  She states that her children have been sick with the same or similar symptoms.  Past Medical History:  Diagnosis Date   Anticoagulant long-term use    eliquis--- managed by pcp  (hx PEs)   Chronic gout    followed by pcp--- pt stated first and last flare-up 05/ 2023  left great toe   History of CVA (cerebrovascular accident) without residual deficits 07/09/2020   neurologist--- sethi;  hospital admission in epic-- dx acute right MCA branch infarct of cryptogenic etiology in setting positive covid (dx 06-27-2022) with multifocal pneumonia, and bilateral upper / lower lobes PEs ( related to hypercoagulabiliy and on birth control pills) ;  treated w/ IV tPA, no residuals and heparin then eliquis//   negative transcranial bubble study   History of iron deficiency anemia    due to DUB   History of pulmonary embolus (PE) 07/09/2020   bilateral upper / lower lobes -- in setting positive covid w/ pneumonia (taking birth control pills)//  pt had negative work-up done for blood disorders during admission//  hematology [facility] Clayton Bibles. Ileana Roup 09-10-2021 note)  recommended long term anticoaglate use   Hyperlipidemia    Hypertension     Hypothyroidism    followed by pcp   MDD (major depressive disorder)    PTSD (post-traumatic stress disorder)    Type 2 diabetes mellitus treated with insulin ([place])    followed by pcp   (02-18-2022  pt stated checks blood sugar BID,  fasting blood sugar-- 120s)   Wears glasses     Patient Active Problem List   Diagnosis Date Noted   Bunion, left 03/15/2022   History of CVA (cerebrovascular accident) 07/19/2020   History of pulmonary embolus (PE) 07/19/2020   Iron deficiency anemia due to chronic blood loss 07/19/2020   COVID-19    Stroke (cerebrum) ([place]) 07/09/2020   CVA (cerebral vascular accident) ([place]) 07/09/2020   Cerebrovascular accident (CVA) ([place]) 07/09/2020   Nexplanon insertion 05/25/2020   Anemia 08/22/2019   Submandibular sialoadenitis 07/15/2018   Elevated liver function tests 06/02/2017   Superficial fungus infection of skin 07/01/2016   Essential hypertension 07/01/2016   Diabetes mellitus ([place]) 12/12/2015   Hyperlipidemia 12/12/2015   Situational depression 12/11/2015   Family h/o Biliary atresia 04/24/2015   History of preterm delivery 04/24/2015   History of cesarean section 04/24/2015   Obesity 04/24/2015   Hypothyroidism 04/24/2015    Past Surgical History:  Procedure Laterality Date   CESAREAN SECTION  02/16/2012   _0    HALLUX VALGUS [place]IN Left 02/26/2022   Procedure: [place];  Surgeon: Trula Slade, DPM;  Location: [place];  Service: Podiatry;  Laterality: Left;    OB History     Gravida  2   Para  2   Term  1   Preterm  1   AB      Living  2      SAB      IAB      Ectopic      Multiple      Live Births  2            Home Medications    Prior to Admission medications   Medication Sig Start Date End Date Taking? Authorizing Provider  fluticasone (FLONASE) 50 MCG/ACT nasal spray Place 2 sprays into both nostrils daily. 07/14/22  Yes Leath-Warren, Alda Lea, NP   promethazine-dextromethorphan (PROMETHAZINE-DM) 6.25-15 MG/5ML syrup Take 5 mLs by mouth 4 (four) times daily as needed for cough. 07/14/22  Yes Leath-Warren, Alda Lea, NP  Accu-Chek FastClix Lancets MISC Use to check blood glucose twice a day 01/02/20   Maryruth Hancock, MD  acetaminophen (TYLENOL) 325 MG tablet Take 650 mg by mouth every 6 (six) hours as needed.     [provider]  allopurinol (ZYLOPRIM) 100 MG tablet Take 1 tablet by mouth once daily 05/12/22   Trula Slade, DPM  apixaban (ELIQUIS) 5 MG TABS tablet Take 1 tablet (5 mg total) by mouth 2 (two) times daily. 04/01/22 04/01/23  Frann Rider, NP  Ascorbic Acid (VITAMIN C) 100 MG tablet Take 100 mg by mouth daily.    [provider]  atorvastatin (LIPITOR) 40 MG tablet Take 1 tablet by mouth once daily Patient taking differently: Take 40 mg by mouth daily. 01/20/22   Frann Rider, NP  blood glucose meter kit and supplies KIT Dispense based on patient and insurance preference. Use up to four times daily as directed. (FOR ICD-9 250.00, 250.01). 05/31/19   Corum, Rex Kras, MD  cephALEXin (KEFLEX) 500 MG capsule Take 1 capsule (500 mg total) by mouth 3 (three) times daily. 02/26/22   Trula Slade, DPM  cetirizine (ZYRTEC) 10 MG tablet Take 10 mg by mouth daily.    [provider]  diphenhydrAMINE (BENADRYL) 25 MG tablet Take 25 mg by mouth at bedtime as needed for sleep.    [provider]  Dulaglutide 1.5 MG/0.5ML SOPN Inject 1.5 mg into the skin once a week.    [provider]  etonogestrel (NEXPLANON) 68 MG IMPL implant 1 each by Subdermal route once.    [provider]  FLUoxetine (PROZAC) 20 MG capsule Take three pills each morning as directed. Patient taking differently: Take 20 mg by mouth daily. 04/14/18   [name], MD  glucose blood (ACCU-CHEK GUIDE) test strip Use to check glucose twice a day 01/02/20   Maryruth Hancock, MD  levothyroxine (SYNTHROID) 175 MCG tablet Take  175 mcg by mouth daily.    [provider]  lisinopril (ZESTRIL) 10 MG tablet Take 10 mg by mouth at bedtime.    [provider]  metFORMIN (GLUCOPHAGE) 1000 MG tablet Take 1,000 mg by mouth 2 (two) times daily with a meal.    [provider]  oxyCODONE-acetaminophen (PERCOCET) 5-325 MG tablet Take 1-2 tablets by mouth every 6 (six) hours as needed for severe pain. 02/26/22 02/26/23  Trula Slade, DPM  promethazine (PHENERGAN) 25 MG tablet Take 1 tablet (25 mg total) by mouth every 8 (eight) hours as needed for nausea or vomiting. 02/26/22   Trula Slade, DPM  zinc gluconate 50 MG tablet Take 50 mg by mouth daily.    [provider]    Family History Family History  Problem Relation Age of Onset   Diabetes Mother    Hypertension Mother    Hyperlipidemia Mother    Kidney disease Mother    Stroke Mother    Diabetes Father    Heart disease Father    Liver disease Son        biliary atresia    Asthma Son    ADD / ADHD Son    ODD Son    Asthma Son    ADD / ADHD Son    ODD Son    Cancer Paternal Aunt    Colon cancer Neg Hx     Social History Social History   Tobacco Use   Smoking status: Former    Years: 4.00    Types: Cigarettes   Smokeless tobacco: Never  Vaping Use   Vaping Use: Never used  Substance Use Topics   Alcohol use: Yes    Comment: rare   Drug use: Never     Allergies   Ammonium-containing compounds   Review of Systems Review of Systems Per HPI  Physical Exam Triage Vital Signs ED Triage Vitals [07/14/22 1045]  Enc Vitals Group     BP 123/89     Pulse Rate (!) 108     Resp 19     Temp 97.6 F (36.4 C)     Temp src      SpO2 96 %     Weight      Height      Head Circumference      Peak Flow      Pain Score 3     Pain Loc      Pain Edu?      Excl. in [place]?    No data found.  Updated Vital Signs BP 123/89   Pulse (!) 108   Temp 97.6 F (36.4 C)   Resp 19   SpO2 96%   Visual Acuity Right  Eye Distance:   Left Eye Distance:   Bilateral Distance:    Right Eye Near:   Left Eye Near:    Bilateral Near:     Physical Exam Vitals and nursing note reviewed.  Constitutional:      General: She is not in acute distress.    Appearance: She is well-developed.  HENT:     Head: Normocephalic.     Right Ear: Tympanic membrane and ear canal normal.     Left Ear: Tympanic membrane and ear canal normal.     Nose: Congestion present. No rhinorrhea.     Mouth/Throat:     Mouth: Mucous membranes are moist.     Pharynx: Uvula midline. Pharyngeal swelling and posterior oropharyngeal erythema present.     Tonsils: No tonsillar exudate. 1+ on the right. 1+ on the left.  Eyes:     Conjunctiva/sclera: Conjunctivae normal.     Pupils: Pupils are equal, round, and reactive to light.  Cardiovascular:     Rate and Rhythm: Regular rhythm. Tachycardia present.     Heart sounds: Normal heart sounds.  Pulmonary:     Effort: Pulmonary effort is normal. No respiratory distress.     Breath sounds: Normal breath sounds. No stridor. No wheezing, rhonchi or rales.  Abdominal:     General: Bowel sounds are normal.     Palpations: Abdomen is soft.     Tenderness: There  is no abdominal tenderness.  Musculoskeletal:     Cervical back: Normal range of motion.  Lymphadenopathy:     Cervical: No cervical adenopathy.  Skin:    General: Skin is warm and dry.  Neurological:     General: No focal deficit present.     Mental Status: She is alert and oriented to person, place, and time.  Psychiatric:        Mood and Affect: Mood normal.        Behavior: Behavior normal.     UC Treatments / Results  Labs (all labs ordered are listed, but only abnormal results are displayed) Labs Reviewed  RESP PANEL BY RT-PCR (FLU A&B, COVID) [id number]  CULTURE, GROUP A STREP [facility])  POCT RAPID STREP A (OFFICE)    EKG   Radiology No results found.  Procedures Procedures (including critical care  time)  Medications Ordered in UC Medications - No data to display  Initial Impression / Assessment and Plan / UC Course  I have reviewed the triage vital signs and the nursing notes.  Pertinent labs & imaging results that were available during my care of the patient were reviewed by me and considered in my medical decision making (see chart for details).  The patient is well-appearing, she is in no acute distress, she is mildly tachycardic, but vital signs are otherwise stable.  Suspect symptoms are consistent with a viral upper respiratory infection with a cough.  COVID/flu test is pending.  Rapid strep test is negative, throat culture is also pending.  Will provide symptomatic treatment with Promethazine DM for her cough and fluticasone 50 mcg nasal spray to help with her nasal congestion and postnasal drainage.  Supportive care recommendations were provided to the patient.  Patient was advised that she is a candidate to receive molnupiravir if her COVID test is positive.  Patient verbalizes understanding.  All questions were answered.  Patient is stable for discharge.  Final Clinical Impressions(s) / UC Diagnoses   Final diagnoses:  Viral upper respiratory tract infection with cough  Sore throat     Discharge Instructions      The rapid strep test was negative, COVID/flu and throat culture are pending.  You will be contacted if the pending test results are positive.  If the COVID test is positive, you are a candidate to receive molnupiravir. Take medication as prescribed. Increase fluids and allow for plenty of rest. Recommend Tylenol as needed for pain, fever, or general discomfort. Recommend throat lozenges, Chloraseptic or honey to help with throat pain. Warm salt water gargles 3-4 times daily to help with throat pain or discomfort. Recommend a diet with soft foods to include soups, broths, puddings, yogurt, Jell-O's, or popsicles until symptoms improve. Please be advised that a  viral illness can last anywhere from 10 to 14 days.  If your symptoms exist beyond this point or if they suddenly worsen, please follow-up with your primary care physician for further evaluation. Follow-up as needed.     ED Prescriptions     Medication Sig Dispense Auth. Provider   promethazine-dextromethorphan (PROMETHAZINE-DM) 6.25-15 MG/5ML syrup Take 5 mLs by mouth 4 (four) times daily as needed for cough. 118 mL Leath-Warren, Alda Lea, NP   fluticasone (FLONASE) 50 MCG/ACT nasal spray Place 2 sprays into both nostrils daily. 16 g Leath-Warren, Alda Lea, NP      PDMP not reviewed this encounter.   Tish Men, NP 07/14/22 1108

## 2022-07-14 NOTE — ED Triage Notes (Signed)
Pt presents with complaints of sore throat that started on Friday. Pt also endorses congestion, cough, and runny nose.

## 2022-07-15 LAB — RESP PANEL BY RT-PCR (FLU A&B, COVID) ARPGX2
Influenza A by PCR: NEGATIVE
Influenza B by PCR: NEGATIVE
SARS Coronavirus 2 by RT PCR: NEGATIVE

## 2022-07-17 LAB — CULTURE, GROUP A STREP (THRC)

## 2022-07-25 ENCOUNTER — Ambulatory Visit
Admission: EM | Admit: 2022-07-25 | Discharge: 2022-07-25 | Disposition: A | Discharge status: Home / Self Care | Payer: Medicaid Other | Attending: Family Medicine | Admitting: Family Medicine

## 2022-07-25 ENCOUNTER — Encounter: Payer: Self-pay | Admitting: Emergency Medicine

## 2022-07-25 DIAGNOSIS — J069 Acute upper respiratory infection, unspecified: Secondary | ICD-10-CM | POA: Diagnosis not present

## 2022-07-25 DIAGNOSIS — Z20828 Contact with and (suspected) exposure to other viral communicable diseases: Secondary | ICD-10-CM

## 2022-07-25 MED ORDER — OSELTAMIVIR PHOSPHATE 75 MG PO CAPS: 75.0000 mg | ORAL_CAPSULE | Freq: Two times a day (BID) | ORAL | 0 refills | Status: DC

## 2022-07-25 MED ORDER — PROMETHAZINE-DM 6.25-15 MG/5ML PO SYRP: 5.0000 mL | ORAL_SOLUTION | Freq: Four times a day (QID) | ORAL | 0 refills | Status: DC | PRN

## 2022-07-25 NOTE — ED Triage Notes (Signed)
Exposed to flu.  Stuffy nose, sneezing, vomiting, fatigued since yesterday.

## 2022-07-25 NOTE — ED Provider Notes (Signed)
[facility]    CSN: [id number] Arrival date & time: 07/25/22  1341      History   Chief Complaint No chief complaint on file.   HPI April Carney is a 41 y.o. female.   Presenting today with 1 day history of nasal congestion, sneezing, fatigue, cough, 1 episode of vomiting with a coughing fit.  Denies chest pain, shortness of breath, abdominal pain, nausea vomiting or diarrhea.  Recent exposure to influenza.  Taking Tylenol with minimal relief.    Past Medical History:  Diagnosis Date   Anticoagulant long-term use    eliquis--- managed by pcp  (hx PEs)   Chronic gout    followed by pcp--- pt stated first and last flare-up 05/ 2023  left great toe   History of CVA (cerebrovascular accident) without residual deficits 07/09/2020   neurologist--- sethi;  hospital admission in epic-- dx acute right MCA branch infarct of cryptogenic etiology in setting positive covid (dx 06-27-2022) with multifocal pneumonia, and bilateral upper / lower lobes PEs ( related to hypercoagulabiliy and on birth control pills) ;  treated w/ IV tPA, no residuals and heparin then eliquis//   negative transcranial bubble study   History of iron deficiency anemia    due to DUB   History of pulmonary embolus (PE) 07/09/2020   bilateral upper / lower lobes -- in setting positive covid w/ pneumonia (taking birth control pills)//  pt had negative work-up done for blood disorders during admission//  hematology [facility] Clayton Bibles. Ileana Roup 09-10-2021 note)  recommended long term anticoaglate use   Hyperlipidemia    Hypertension    Hypothyroidism    followed by pcp   MDD (major depressive disorder)    PTSD (post-traumatic stress disorder)    Type 2 diabetes mellitus treated with insulin ([place])    followed by pcp   (02-18-2022  pt stated checks blood sugar BID,  fasting blood sugar-- 120s)   Wears glasses     Patient Active Problem List   Diagnosis Date Noted   Bunion, left 03/15/2022   History of  CVA (cerebrovascular accident) 07/19/2020   History of pulmonary embolus (PE) 07/19/2020   Iron deficiency anemia due to chronic blood loss 07/19/2020   COVID-19    Stroke (cerebrum) ([place]) 07/09/2020   CVA (cerebral vascular accident) ([place]) 07/09/2020   Cerebrovascular accident (CVA) ([place]) 07/09/2020   Nexplanon insertion 05/25/2020   Anemia 08/22/2019   Submandibular sialoadenitis 07/15/2018   Elevated liver function tests 06/02/2017   Superficial fungus infection of skin 07/01/2016   Essential hypertension 07/01/2016   Diabetes mellitus ([place]) 12/12/2015   Hyperlipidemia 12/12/2015   Situational depression 12/11/2015   Family h/o Biliary atresia 04/24/2015   History of preterm delivery 04/24/2015   History of cesarean section 04/24/2015   Obesity 04/24/2015   Hypothyroidism 04/24/2015    Past Surgical History:  Procedure Laterality Date   CESAREAN SECTION  02/16/2012   _0    HALLUX VALGUS [place]IN Left 02/26/2022   Procedure: [place];  Surgeon: Trula Slade, DPM;  Location: [place];  Service: Podiatry;  Laterality: Left;   OB History     Gravida  2   Para  2   Term  1   Preterm  1   AB      Living  2      SAB      IAB      Ectopic      Multiple  Live Births  2           Home Medications    Prior to Admission medications   Medication Sig Start Date End Date Taking? Authorizing Provider  oseltamivir (TAMIFLU) 75 MG capsule Take 1 capsule (75 mg total) by mouth every 12 (twelve) hours. 07/25/22  Yes [name], PA-C  promethazine-dextromethorphan (PROMETHAZINE-DM) 6.25-15 MG/5ML syrup Take 5 mLs by mouth 4 (four) times daily as needed. 07/25/22  Yes [name], PA-C  Accu-Chek FastClix Lancets MISC Use to check blood glucose twice a day 01/02/20   Maryruth Hancock, MD  acetaminophen (TYLENOL) 325 MG tablet Take 650 mg by mouth every 6 (six) hours as needed.     [provider]   allopurinol (ZYLOPRIM) 100 MG tablet Take 1 tablet by mouth once daily 05/12/22   Trula Slade, DPM  apixaban (ELIQUIS) 5 MG TABS tablet Take 1 tablet (5 mg total) by mouth 2 (two) times daily. 04/01/22 04/01/23  Frann Rider, NP  Ascorbic Acid (VITAMIN C) 100 MG tablet Take 100 mg by mouth daily.    [provider]  atorvastatin (LIPITOR) 40 MG tablet Take 1 tablet by mouth once daily Patient taking differently: Take 40 mg by mouth daily. 01/20/22   Frann Rider, NP  blood glucose meter kit and supplies KIT Dispense based on patient and insurance preference. Use up to four times daily as directed. (FOR ICD-9 250.00, 250.01). 05/31/19   Corum, Rex Kras, MD  cephALEXin (KEFLEX) 500 MG capsule Take 1 capsule (500 mg total) by mouth 3 (three) times daily. 02/26/22   Trula Slade, DPM  cetirizine (ZYRTEC) 10 MG tablet Take 10 mg by mouth daily.    [provider]  diphenhydrAMINE (BENADRYL) 25 MG tablet Take 25 mg by mouth at bedtime as needed for sleep.    [provider]  Dulaglutide 1.5 MG/0.5ML SOPN Inject 1.5 mg into the skin once a week.    [provider]  etonogestrel (NEXPLANON) 68 MG IMPL implant 1 each by Subdermal route once.    [provider]  FLUoxetine (PROZAC) 20 MG capsule Take three pills each morning as directed. Patient taking differently: Take 20 mg by mouth daily. 04/14/18   [name], MD  fluticasone (FLONASE) 50 MCG/ACT nasal spray Place 2 sprays into both nostrils daily. 07/14/22   Leath-Warren, Alda Lea, NP  glucose blood (ACCU-CHEK GUIDE) test strip Use to check glucose twice a day 01/02/20   Maryruth Hancock, MD  levothyroxine (SYNTHROID) 175 MCG tablet Take 175 mcg by mouth daily.    [provider]  lisinopril (ZESTRIL) 10 MG tablet Take 10 mg by mouth at bedtime.    [provider]  metFORMIN (GLUCOPHAGE) 1000 MG tablet Take 1,000 mg by mouth 2 (two) times daily with a meal.    [provider]  oxyCODONE-acetaminophen (PERCOCET) 5-325 MG tablet Take 1-2 tablets by mouth every 6 (six) hours as needed for severe pain. 02/26/22 02/26/23  Trula Slade, DPM  promethazine (PHENERGAN) 25 MG tablet Take 1 tablet (25 mg total) by mouth every 8 (eight) hours as needed for nausea or vomiting. 02/26/22   Trula Slade, DPM  promethazine-dextromethorphan (PROMETHAZINE-DM) 6.25-15 MG/5ML syrup Take 5 mLs by mouth 4 (four) times daily as needed for cough. 07/14/22   Leath-Warren, Alda Lea, NP  zinc gluconate 50 MG tablet Take 50 mg by mouth daily.    [provider]    Family History Family  History  Problem Relation Age of Onset   Diabetes Mother    Hypertension Mother    Hyperlipidemia Mother    Kidney disease Mother    Stroke Mother    Diabetes Father    Heart disease Father    Liver disease Son        biliary atresia    Asthma Son    ADD / ADHD Son    ODD Son    Asthma Son    ADD / ADHD Son    ODD Son    Cancer Paternal Aunt    Colon cancer Neg Hx     Social History Social History   Tobacco Use   Smoking status: Former    Years: 4.00    Types: Cigarettes   Smokeless tobacco: Never  Vaping Use   Vaping Use: Never used  Substance Use Topics   Alcohol use: Yes    Comment: rare   Drug use: Never     Allergies   Ammonium-containing compounds   Review of Systems Review of Systems PER HPI  Physical Exam Triage Vital Signs ED Triage Vitals  Enc Vitals Group     BP 07/25/22 1700 117/87     Pulse Rate 07/25/22 1700 (!) 124     Resp 07/25/22 1700 20     Temp 07/25/22 1700 98.1 F (36.7 C)     Temp Source 07/25/22 1700 Oral     SpO2 07/25/22 1700 98 %     Weight --      Height --      Head Circumference --      Peak Flow --      Pain Score 07/25/22 1702 0     Pain Loc --      Pain Edu? --      Excl. in [place]? --    No data found.  Updated Vital Signs BP 117/87 (BP Location: Right Arm)   Pulse (!) 124   Temp 98.1 F (36.7 C) (Oral)    Resp 20   SpO2 98%   Visual Acuity Right Eye Distance:   Left Eye Distance:   Bilateral Distance:    Right Eye Near:   Left Eye Near:    Bilateral Near:     Physical Exam Vitals and nursing note reviewed.  Constitutional:      Appearance: Normal appearance.  HENT:     Head: Atraumatic.     Right Ear: Tympanic membrane and external ear normal.     Left Ear: Tympanic membrane and external ear normal.     Nose: Rhinorrhea present.     Mouth/Throat:     Mouth: Mucous membranes are moist.     Pharynx: Posterior oropharyngeal erythema present.  Eyes:     Extraocular Movements: Extraocular movements intact.     Conjunctiva/sclera: Conjunctivae normal.  Cardiovascular:     Rate and Rhythm: Normal rate and regular rhythm.     Heart sounds: Normal heart sounds.  Pulmonary:     Effort: Pulmonary effort is normal.     Breath sounds: Normal breath sounds. No wheezing or rales.  Musculoskeletal:        General: Normal range of motion.     Cervical back: Normal range of motion and neck supple.  Skin:    General: Skin is warm and dry.  Neurological:     Mental Status: She is alert and oriented to person, place, and time.  Psychiatric:        Mood and  Affect: Mood normal.        Thought Content: Thought content normal.     UC Treatments / Results  Labs (all labs ordered are listed, but only abnormal results are displayed) Labs Reviewed - No data to display  EKG  Radiology No results found.  Procedures Procedures (including critical care time)  Medications Ordered in UC Medications - No data to display  Initial Impression / Assessment and Plan / UC Course  I have reviewed the triage vital signs and the nursing notes.  Pertinent labs & imaging results that were available during my care of the patient were reviewed by me and considered in my medical decision making (see chart for details).     Suspect influenza, treat with Tamiflu, Phenergan DM, supportive  over-the-counter medications and home care.  Return for worsening symptoms.  Final Clinical Impressions(s) / UC Diagnoses   Final diagnoses:  Viral URI with cough  Exposure to influenza   Discharge Instructions   None    ED Prescriptions     Medication Sig Dispense Auth. Provider   oseltamivir (TAMIFLU) 75 MG capsule Take 1 capsule (75 mg total) by mouth every 12 (twelve) hours. 10 capsule [name], [name]   promethazine-dextromethorphan (PROMETHAZINE-DM) 6.25-15 MG/5ML syrup Take 5 mLs by mouth 4 (four) times daily as needed. 100 mL [name], [name]      PDMP not reviewed this encounter.   [name], [name] 07/25/22 1732

## 2022-07-31 ENCOUNTER — Other Ambulatory Visit: Payer: Self-pay | Admitting: Podiatry

## 2022-08-15 ENCOUNTER — Other Ambulatory Visit: Payer: Medicaid Other | Admitting: Obstetrics and Gynecology

## 2022-08-15 NOTE — Patient Instructions (Signed)
Hi April Carney to have missed you today-I hope you and your family are well - as a part of your Medicaid benefit, you are eligible for care management and care coordination services at no cost or copay. I was unable to reach you by phone today but would be happy to help you with your health related needs. Please feel free to call me at [phone].  A member of the Managed Medicaid care management team will reach out to you again over the next 30 business days.   [name] RN, BSN [place]  [facility] - Managed Medicaid High Risk [phone]

## 2022-08-15 NOTE — Patient Outreach (Signed)
  Medicaid Managed Care   Unsuccessful Attempt Note   08/15/2022 Name: April Carney MRN: [id number] DOB: 02-04-1981  Referred by: Wilburt Finlay, MD Reason for referral : High Risk Managed Medicaid (Unsuccessful telephone outreach)   An unsuccessful telephone outreach was attempted today. The patient was referred to the case management team for assistance with care management and care coordination.    Follow Up Plan: The Managed Medicaid care management team will reach out to the patient again over the next 30 business  days. and The  Patient has been provided with contact information for the Managed Medicaid care management team and has been advised to call with any health related questions or concerns.    [name] RN, BSN [place]  [facility] - Managed Medicaid High Risk [phone]

## 2022-08-27 ENCOUNTER — Other Ambulatory Visit: Payer: Self-pay | Admitting: Podiatry

## 2022-09-04 DIAGNOSIS — E039 Hypothyroidism, unspecified: Secondary | ICD-10-CM | POA: Diagnosis not present

## 2022-09-11 ENCOUNTER — Other Ambulatory Visit: Payer: Medicaid Other | Admitting: Obstetrics and Gynecology

## 2022-09-11 NOTE — Patient Instructions (Signed)
Hi Ms. Ewer, I hope you and your family are ok - as a part of your Medicaid benefit, you are eligible for care management and care coordination services at no cost or copay. I was unable to reach you by phone today but would be happy to help you with your health related needs. Please feel free to call me at [phone]  A member of the Managed Medicaid care management team will reach out to you again over the next 30 business days.   [name] RN, BSN [place]  [facility] - Managed Medicaid High Risk [phone]

## 2022-09-11 NOTE — Patient Outreach (Signed)
  Medicaid Managed Care   Unsuccessful Attempt Note   09/11/2022 Name: April Carney MRN: [id number] DOB: 06-Mar-1981  Referred by: Wilburt Finlay, MD Reason for referral : High Risk Managed Medicaid (Unsuccessful telephone call)  A second unsuccessful telephone outreach was attempted today. The patient was referred to the case management team for assistance with care management and care coordination.    Follow Up Plan: The Managed Medicaid care management team will reach out to the patient again over the next 30 business  days. and The  Patient has been provided with contact information for the Managed Medicaid care management team and has been advised to call with any health related questions or concerns.   [name] RN, BSN [place]  [facility] - Managed Medicaid High Risk [phone]

## 2022-09-18 ENCOUNTER — Telehealth: Payer: Self-pay

## 2022-09-18 NOTE — Telephone Encounter (Signed)
..   Medicaid Managed Care   Unsuccessful Outreach Note  09/18/2022 Name: April Carney MRN: [id number] DOB: [birth year]  Referred by: Wilburt Finlay, MD Reason for referral : Appointment (I called the patient today to get her rescheduled for her missed appointments with the [facility] RNCM. I left my name and number on her voicemail.)   Third unsuccessful telephone outreach was attempted today. The patient was referred to the case management team for assistance with care management and care coordination. The patient's primary care provider has been notified of our unsuccessful attempts to make or maintain contact with the patient. The care management team is pleased to engage with this patient at any time in the future should he/she be interested in assistance from the care management team.   Follow Up Plan: We have been unable to make contact with the patient for follow up. The care management team is available to follow up with the patient after provider conversation with the patient regarding recommendation for care management engagement and subsequent re-referral to the care management team.   [place], [place]

## 2022-09-19 ENCOUNTER — Other Ambulatory Visit: Payer: Medicaid Other | Admitting: Obstetrics and Gynecology

## 2022-09-19 NOTE — Patient Instructions (Signed)
Hi April Carney, I am sorry we have been missing you and I hope you are okay- as a part of your Medicaid benefit, you are eligible for care management and care coordination services at no cost or copay. We have been  unable to reach you by phone but would be happy to help you with your health related needs. Please feel free to call me at [phone].  A member of the Managed Medicaid care management team will reach out to you again over the next 30 business  days.   [name] RN, BSN [place]  [facility] - Managed Medicaid High Risk [phone]

## 2022-09-19 NOTE — Patient Outreach (Cosign Needed)
  Medicaid Managed Care   Unsuccessful Attempt Note   09/19/2022 Name: April Carney MRN: [id number] DOB: 04-26-1981  Referred by: Wilburt Finlay, MD Reason for referral : High Risk Managed Medicaid (Unsuccessful telephone outreach)  Third unsuccessful telephone outreach was attempted today. The patient was referred to the case management team for assistance with care management and care coordination. The patient's primary care provider has been notified of our unsuccessful attempts to make or maintain contact with the patient. The care management team is pleased to engage with this patient at any time in the future should he/she be interested in assistance from the care management team.    Follow Up Plan: The  Patient has been provided with contact information for the Managed Medicaid care management team and has been advised to call with any health related questions or concerns.   [name] RN, BSN [place]  [facility] - Managed Medicaid High Risk [phone]

## 2022-09-22 DIAGNOSIS — E039 Hypothyroidism, unspecified: Secondary | ICD-10-CM | POA: Diagnosis not present

## 2022-09-22 DIAGNOSIS — S46811A Strain of other muscles, fascia and tendons at shoulder and upper arm level, right arm, initial encounter: Secondary | ICD-10-CM | POA: Diagnosis not present

## 2022-09-22 DIAGNOSIS — Z23 Encounter for immunization: Secondary | ICD-10-CM | POA: Diagnosis not present

## 2022-09-22 DIAGNOSIS — Z86711 Personal history of pulmonary embolism: Secondary | ICD-10-CM | POA: Diagnosis not present

## 2022-10-02 ENCOUNTER — Other Ambulatory Visit: Payer: Self-pay | Admitting: Podiatry

## 2022-11-02 ENCOUNTER — Other Ambulatory Visit: Payer: Self-pay | Admitting: Podiatry

## 2022-12-07 ENCOUNTER — Other Ambulatory Visit: Payer: Self-pay | Admitting: Podiatry

## 2022-12-23 DIAGNOSIS — Z0000 Encounter for general adult medical examination without abnormal findings: Secondary | ICD-10-CM | POA: Diagnosis not present

## 2022-12-23 DIAGNOSIS — E119 Type 2 diabetes mellitus without complications: Secondary | ICD-10-CM | POA: Diagnosis not present

## 2022-12-23 DIAGNOSIS — F32A Depression, unspecified: Secondary | ICD-10-CM | POA: Diagnosis not present

## 2022-12-23 DIAGNOSIS — E785 Hyperlipidemia, unspecified: Secondary | ICD-10-CM | POA: Diagnosis not present

## 2022-12-23 DIAGNOSIS — Z6841 Body Mass Index (BMI) 40.0 and over, adult: Secondary | ICD-10-CM | POA: Diagnosis not present

## 2022-12-23 DIAGNOSIS — I10 Essential (primary) hypertension: Secondary | ICD-10-CM | POA: Diagnosis not present

## 2023-01-01 ENCOUNTER — Ambulatory Visit
Admission: EM | Admit: 2023-01-01 | Discharge: 2023-01-01 | Disposition: A | Discharge status: Home / Self Care | Payer: Medicaid Other

## 2023-01-01 ENCOUNTER — Other Ambulatory Visit: Payer: Self-pay

## 2023-01-01 ENCOUNTER — Encounter: Payer: Self-pay | Admitting: Emergency Medicine

## 2023-01-01 DIAGNOSIS — H00021 Hordeolum internum right upper eyelid: Secondary | ICD-10-CM | POA: Diagnosis not present

## 2023-01-01 NOTE — ED Provider Notes (Signed)
[facility]    CSN: [id number] Arrival date & time: 01/01/23  0805      History   Chief Complaint Chief Complaint  Patient presents with   Eye Problem    HPI April Carney is a 42 y.o. female.   Patient presents today for 2-day history of right upper eyelid swelling and pain.  She denies eye redness, itching, drainage, foreign body sensation, use of contacts, blurred or double vision.  Reports history of similar, however usually gets better on its own in 1 day.  Reports there was a little bit of watery drainage yesterday, but this has improved.  Has not tried anything for symptoms so far.  Denies any known injury to the eye.    Past Medical History:  Diagnosis Date   Anticoagulant long-term use    eliquis--- managed by pcp  (hx PEs)   Chronic gout    followed by pcp--- pt stated first and last flare-up 05/ 2023  left great toe   History of CVA (cerebrovascular accident) without residual deficits 07/09/2020   neurologist--- sethi;  hospital admission in epic-- dx acute right MCA branch infarct of cryptogenic etiology in setting positive covid (dx 06-27-2022) with multifocal pneumonia, and bilateral upper / lower lobes PEs ( related to hypercoagulabiliy and on birth control pills) ;  treated w/ IV tPA, no residuals and heparin then eliquis//   negative transcranial bubble study   History of iron deficiency anemia    due to DUB   History of pulmonary embolus (PE) 07/09/2020   bilateral upper / lower lobes -- in setting positive covid w/ pneumonia (taking birth control pills)//  pt had negative work-up done for blood disorders during admission//  hematology [facility] Dr Seth Bake. Shellia Carwin 09-10-2021 note)  recommended long term anticoaglate use   Hyperlipidemia    Hypertension    Hypothyroidism    followed by pcp   MDD (major depressive disorder)    PTSD (post-traumatic stress disorder)    Type 2 diabetes mellitus treated with insulin (HCC)    followed by pcp    (02-18-2022  pt stated checks blood sugar BID,  fasting blood sugar-- 120s)   Wears glasses     Patient Active Problem List   Diagnosis Date Noted   Bunion, left 03/15/2022   History of CVA (cerebrovascular accident) 07/19/2020   History of pulmonary embolus (PE) 07/19/2020   Iron deficiency anemia due to chronic blood loss 07/19/2020   COVID-19    Stroke (cerebrum) (HCC) 07/09/2020   CVA (cerebral vascular accident) (HCC) 07/09/2020   Cerebrovascular accident (CVA) (HCC) 07/09/2020   Nexplanon insertion 05/25/2020   Anemia 08/22/2019   Submandibular sialoadenitis 07/15/2018   Elevated liver function tests 06/02/2017   Superficial fungus infection of skin 07/01/2016   Essential hypertension 07/01/2016   Diabetes mellitus (HCC) 12/12/2015   Hyperlipidemia 12/12/2015   Situational depression 12/11/2015   Family h/o Biliary atresia 04/24/2015   History of preterm delivery 04/24/2015   History of cesarean section 04/24/2015   Obesity 04/24/2015   Hypothyroidism 04/24/2015    Past Surgical History:  Procedure Laterality Date   CESAREAN SECTION  02/16/2012   @[facility]    HALLUX VALGUS AUSTIN Left 02/26/2022   Procedure: HALLUX VALGUS AUSTIN;  Surgeon: Vivi Barrack, DPM;  Location: [facility];  Service: Podiatry;  Laterality: Left;    OB History     Gravida  2   Para  2   Term  1   Preterm  1  AB      Living  2      SAB      IAB      Ectopic      Multiple      Live Births  2            Home Medications    Prior to Admission medications   Medication Sig Start Date End Date Taking? Authorizing Provider  Accu-Chek FastClix Lancets MISC Use to check blood glucose twice a day 01/02/20   Wandra Feinstein, MD  acetaminophen (TYLENOL) 325 MG tablet Take 650 mg by mouth every 6 (six) hours as needed.     [provider]  allopurinol (ZYLOPRIM) 100 MG tablet Take 1 tablet by mouth once daily 12/08/22   Vivi Barrack, DPM  apixaban  (ELIQUIS) 5 MG TABS tablet Take 1 tablet (5 mg total) by mouth 2 (two) times daily. 04/01/22 04/01/23  Ihor Austin, NP  Ascorbic Acid (VITAMIN C) 100 MG tablet Take 100 mg by mouth daily.    [provider]  atorvastatin (LIPITOR) 40 MG tablet Take 1 tablet by mouth once daily Patient taking differently: Take 40 mg by mouth daily. 01/20/22   Ihor Austin, NP  blood glucose meter kit and supplies KIT Dispense based on patient and insurance preference. Use up to four times daily as directed. (FOR ICD-9 250.00, 250.01). 05/31/19   Corum, Minerva Fester, MD  cephALEXin (KEFLEX) 500 MG capsule Take 1 capsule (500 mg total) by mouth 3 (three) times daily. 02/26/22   Vivi Barrack, DPM  cetirizine (ZYRTEC) 10 MG tablet Take 10 mg by mouth daily.    [provider]  diphenhydrAMINE (BENADRYL) 25 MG tablet Take 25 mg by mouth at bedtime as needed for sleep.    [provider]  Dulaglutide 1.5 MG/0.5ML SOPN Inject 1.5 mg into the skin once a week.    [provider]  etonogestrel (NEXPLANON) 68 MG IMPL implant 1 each by Subdermal route once.    [provider]  FLUoxetine (PROZAC) 20 MG capsule Take three pills each morning as directed. Patient taking differently: Take 20 mg by mouth daily. 04/14/18   Aliene Beams, MD  fluticasone (FLONASE) 50 MCG/ACT nasal spray Place 2 sprays into both nostrils daily. 07/14/22   Leath-Warren, Sadie Haber, NP  glucose blood (ACCU-CHEK GUIDE) test strip Use to check glucose twice a day 01/02/20   Wandra Feinstein, MD  levothyroxine (SYNTHROID) 175 MCG tablet Take 175 mcg by mouth daily.    [provider]  lisinopril (ZESTRIL) 10 MG tablet Take 10 mg by mouth at bedtime.    [provider]  metFORMIN (GLUCOPHAGE) 1000 MG tablet Take 1,000 mg by mouth 2 (two) times daily with a meal.    [provider]  oseltamivir (TAMIFLU) 75 MG capsule Take 1 capsule (75 mg total) by mouth every 12 (twelve) hours. 07/25/22   Particia Nearing, PA-C  oxyCODONE-acetaminophen (PERCOCET) 5-325 MG tablet Take 1-2 tablets by mouth every 6 (six) hours as needed for severe pain. 02/26/22 02/26/23  Vivi Barrack, DPM  promethazine (PHENERGAN) 25 MG tablet Take 1 tablet (25 mg total) by mouth every 8 (eight) hours as needed for nausea or vomiting. 02/26/22   Vivi Barrack, DPM  promethazine-dextromethorphan (PROMETHAZINE-DM) 6.25-15 MG/5ML syrup Take 5 mLs by mouth 4 (four) times daily as needed for cough. 07/14/22   Leath-Warren, Sadie Haber, NP  promethazine-dextromethorphan (PROMETHAZINE-DM) 6.25-15 MG/5ML syrup Take 5 mLs  by mouth 4 (four) times daily as needed. 07/25/22   Particia Nearing, PA-C  zinc gluconate 50 MG tablet Take 50 mg by mouth daily.    [provider]    Family History Family History  Problem Relation Age of Onset   Diabetes Mother    Hypertension Mother    Hyperlipidemia Mother    Kidney disease Mother    Stroke Mother    Diabetes Father    Heart disease Father    Liver disease Son        biliary atresia    Asthma Son    ADD / ADHD Son    ODD Son    Asthma Son    ADD / ADHD Son    ODD Son    Cancer Paternal Aunt    Colon cancer Neg Hx     Social History Social History   Tobacco Use   Smoking status: Former    Years: 4    Types: Cigarettes   Smokeless tobacco: Never  Vaping Use   Vaping Use: Never used  Substance Use Topics   Alcohol use: Yes    Comment: rare   Drug use: Never     Allergies   Ammonium-containing compounds   Review of Systems Review of Systems Per HPI  Physical Exam Triage Vital Signs ED Triage Vitals [01/01/23 0819]  Enc Vitals Group     BP 132/89     Pulse Rate 89     Resp 20     Temp 98.8 F (37.1 C)     Temp Source Oral     SpO2 95 %     Weight      Height      Head Circumference      Peak Flow      Pain Score 3     Pain Loc      Pain Edu?      Excl. in GC?    No data found.  Updated Vital Signs BP 132/89 (BP  Location: Right Arm)   Pulse 89   Temp 98.8 F (37.1 C) (Oral)   Resp 20   SpO2 95%   Visual Acuity Right Eye Distance:   Left Eye Distance:   Bilateral Distance:    Right Eye Near:   Left Eye Near:    Bilateral Near:     Physical Exam Vitals and nursing note reviewed.  Constitutional:      General: She is not in acute distress.    Appearance: Normal appearance. She is not toxic-appearing.  HENT:     Head: Normocephalic and atraumatic.     Right Ear: External ear normal.     Left Ear: External ear normal.     Mouth/Throat:     Mouth: Mucous membranes are moist.     Pharynx: Oropharynx is clear. No oropharyngeal exudate or posterior oropharyngeal erythema.  Eyes:     General: No scleral icterus.       Right eye: Hordeolum (internum right upper eyelid) present. No discharge.        Left eye: No discharge.     Extraocular Movements: Extraocular movements intact.     Right eye: Normal extraocular motion.     Left eye: Normal extraocular motion.     Conjunctiva/sclera: Conjunctivae normal.     Right eye: Right conjunctiva is not injected. No chemosis, exudate or hemorrhage.    Left eye: Left conjunctiva is not injected. No chemosis, exudate or hemorrhage.  Pupils: Pupils are equal, round, and reactive to light.     Comments: Mild erythema and edema of the right upper eyelid; nontender to touch and no warmth  Pulmonary:     Effort: Pulmonary effort is normal. No respiratory distress.  Musculoskeletal:     Cervical back: Normal range of motion.  Lymphadenopathy:     Cervical: No cervical adenopathy.  Skin:    General: Skin is warm and dry.     Coloration: Skin is not jaundiced or pale.     Findings: No erythema.  Neurological:     Mental Status: She is alert and oriented to person, place, and time.  Psychiatric:        Behavior: Behavior is cooperative.      UC Treatments / Results  Labs (all labs ordered are listed, but only abnormal results are  displayed) Labs Reviewed - No data to display  EKG   Radiology No results found.  Procedures Procedures (including critical care time)  Medications Ordered in UC Medications - No data to display  Initial Impression / Assessment and Plan / UC Course  I have reviewed the triage vital signs and the nursing notes.  Pertinent labs & imaging results that were available during my care of the patient were reviewed by me and considered in my medical decision making (see chart for details).   Patient is well-appearing, normotensive, afebrile, not tachycardic, not tachypneic, oxygenating well on room air.    1. Hordeolum internum of right upper eyelid Treat with warm compresses, supportive care discussed with patient Follow-up precautions discussed  The patient was given the opportunity to ask questions.  All questions answered to their satisfaction.  The patient is in agreement to this plan.    Final Clinical Impressions(s) / UC Diagnoses   Final diagnoses:  Hordeolum internum of right upper eyelid     Discharge Instructions      You have a stye of your right upper eyelid which is causing the swelling.  Please use warm compresses 15 minutes on 45 minutes off every hour while awake.  You can use a lubricating eye drop like Blink eye drops if your eye feels dry.  This should improve over the next week or so, follow up with PCP or Optometrist with no improvement or worsening of symptoms despite treatment.    ED Prescriptions   None    PDMP not reviewed this encounter.   Valentino Nose, NP 01/01/23 1031

## 2023-01-01 NOTE — ED Triage Notes (Signed)
Pt reports right upper lid selling and intermittent drainage x4 days. Denies any new self-care products, injury.

## 2023-01-01 NOTE — Discharge Instructions (Signed)
You have a stye of your right upper eyelid which is causing the swelling.  Please use warm compresses 15 minutes on 45 minutes off every hour while awake.  You can use a lubricating eye drop like Blink eye drops if your eye feels dry.  This should improve over the next week or so, follow up with PCP or Optometrist with no improvement or worsening of symptoms despite treatment.

## 2023-01-08 ENCOUNTER — Other Ambulatory Visit: Payer: Self-pay | Admitting: Podiatry

## 2023-02-05 ENCOUNTER — Other Ambulatory Visit: Payer: Self-pay | Admitting: Podiatry

## 2023-02-25 ENCOUNTER — Encounter: Payer: Medicaid Other | Attending: Family Medicine | Admitting: Nutrition

## 2023-02-25 VITALS — Ht 65.0 in | Wt 284.2 lb

## 2023-02-25 DIAGNOSIS — E118 Type 2 diabetes mellitus with unspecified complications: Secondary | ICD-10-CM | POA: Diagnosis not present

## 2023-02-25 NOTE — Progress Notes (Signed)
Medical Nutrition Therapy  Appointment Start time:  0800  Appointment End time:  0900  Primary concerns today: Obesity and DM Type 2 Referral diagnosis: E66.01, E11.8  Preferred learning style: No preference  Learning readiness: Ready  NUTRITION ASSESSMENT  42 yr old female referred for Obesity and Type 2 Dm. Got diagnosed with DM in 2021.  Testing blood sugars FBS 130-140's  Nighttimes 160-190's. Metformin 1000 mg BID, Trulicity weekly-has been on it for a year. PMH: CVA, HTN,  Hypothyroidism, situational depression, Hyperlipidemia, anemia Last A1C 6.5%. Gained 17 lbs in the last year. Currently not working. Eats most foods away from home.   Anthropometrics  Wt Readings from Last 3 Encounters:  02/25/23 284 lb 3.2 oz (128.9 kg)  02/26/22 267 lb 3.2 oz (121.2 kg)  11/29/21 275 lb (124.7 kg)   Ht Readings from Last 3 Encounters:  02/25/23 5\' 5"  (1.651 m)  02/26/22 5\' 5"  (1.651 m)  11/29/21 5\' 5"  (1.651 m)   Body mass index is 47.29 kg/m. @BMIFA @ Facility age limit for growth %iles is 20 years. Facility age limit for growth %iles is 20 years.    Clinical Medical Hx: See chart Medications: see chart Labs:  Lab Results  Component Value Date   HGBA1C 6.5 (H) 09/27/2021      Latest Ref Rng & Units 02/26/2022   10:45 AM 12/02/2021    4:27 PM 07/16/2020    2:14 AM  CMP  Glucose 70 - 99 mg/dL 478  295  621   BUN 6 - 20 mg/dL 5  9  10    Creatinine 0.44 - 1.00 mg/dL 3.08  6.57  8.46   Sodium 135 - 145 mmol/L 138  138  135   Potassium 3.5 - 5.1 mmol/L 3.9  4.2  4.6   Chloride 98 - 111 mmol/L 101  103  102   CO2 20 - 32 mmol/L  25  21   Calcium 8.6 - 10.2 mg/dL  9.7  9.0   Total Protein 6.5 - 8.1 g/dL   6.3   Total Bilirubin 0.3 - 1.2 mg/dL   0.9   Alkaline Phos 38 - 126 U/L   52   AST 15 - 41 U/L   43   ALT 0 - 44 U/L   49     Notable Signs/Symptoms: Tired, no energy,   Lifestyle & Dietary Hx Lives with her 2 sons- 15 and 21. Eats out often; doesn't cook at home as  much.  Estimated daily fluid intake: 40 oz Supplements:  Sleep: not very good Stress / self-care: has some family stress Current average weekly physical activity: ADL  24-Hr Dietary Recall Eats 1-2 meals per day Says she doesn't get hungry.  Estimated Energy Needs Calories: 1200 Carbohydrate: 135g Protein: 90g Fat: 33g   NUTRITION DIAGNOSIS  NB-1.1 Food and nutrition-related knowledge deficit As related to Diabetes and obesity.  As evidenced by A1C 6.5% and Obesity with BMI of 47.Marland Kitchen   NUTRITION INTERVENTION  Nutrition education (E-1) on the following topics:  Nutrition and Diabetes education provided on My Plate, CHO counting, meal planning, portion sizes, timing of meals, avoiding snacks between meals unless having a low blood sugar, target ranges for A1C and blood sugars, signs/symptoms and treatment of hyper/hypoglycemia, monitoring blood sugars, taking medications as prescribed, benefits of exercising 30 minutes per day and prevention of complications of DM.  Lifestyle Medicine  - Whole Food, Plant Predominant Nutrition is highly recommended: Eat Plenty of vegetables, Mushrooms, fruits, Legumes, Whole  Grains, Nuts, seeds in lieu of processed meats, processed snacks/pastries red meat, poultry, eggs.    -It is better to avoid simple carbohydrates including: Cakes, Sweet Desserts, Ice Cream, Soda (diet and regular), Sweet Tea, Candies, Chips, Cookies, Store Bought Juices, Alcohol in Excess of  1-2 drinks a day, Lemonade,  Artificial Sweeteners, Doughnuts, Coffee Creamers, "Sugar-free" Products, etc, etc.  This is not a complete list.....  Exercise: If you are able: 30 -60 minutes a day ,4 days a week, or 150 minutes a week.  The longer the better.  Combine stretch, strength, and aerobic activities.  If you were told in the past that you have high risk for cardiovascular diseases, you may seek evaluation by your heart doctor prior to initiating moderate to intense exercise  programs.   Handouts Provided Include  Know your numbers Lifestyle Medicine Handouts  Learning Style & Readiness for Change Teaching method utilized: Visual & Auditory  Demonstrated degree of understanding via: Teach Back  Barriers to learning/adherence to lifestyle change: willingness to change habits  Goals Established by Pt Goals  Drink 5 bottles of water per day Cut out diet sodas when out and about and just buy water Walk 15 minutes 3 days per week Test blood sugars twice a day Increase vegetables to 1 serving with lunch and dinner daily. Cut out snacking after dinner- 7pm. Lose 1 lb per week. Focus on more whole plant based foods at meals.   MONITORING & EVALUATION Dietary intake, weekly physical activity, and weight and BS in 1 month.  Next Steps  Patient is to work on meal planning, meal prepping and exercise.

## 2023-02-25 NOTE — Patient Instructions (Addendum)
Goals  Drink 5 bottles of water per day Cut out diet sodas when out and about and just buy water Walk 15 minutes 3 days per week Test blood sugars twice a day Increase vegetables to 1 serving with lunch and dinner daily. Cut out snacking after dinner- 7pm. Lose 1 lb per week. Focus on more whole plant based foods at meals.

## 2023-03-02 ENCOUNTER — Encounter: Payer: Self-pay | Admitting: Nutrition

## 2023-04-08 DIAGNOSIS — Z23 Encounter for immunization: Secondary | ICD-10-CM | POA: Diagnosis not present

## 2023-04-08 DIAGNOSIS — M109 Gout, unspecified: Secondary | ICD-10-CM | POA: Diagnosis not present

## 2023-04-08 DIAGNOSIS — F32A Depression, unspecified: Secondary | ICD-10-CM | POA: Diagnosis not present

## 2023-04-08 DIAGNOSIS — I10 Essential (primary) hypertension: Secondary | ICD-10-CM | POA: Diagnosis not present

## 2023-04-08 DIAGNOSIS — E119 Type 2 diabetes mellitus without complications: Secondary | ICD-10-CM | POA: Diagnosis not present

## 2023-04-08 DIAGNOSIS — E039 Hypothyroidism, unspecified: Secondary | ICD-10-CM | POA: Diagnosis not present

## 2023-04-09 ENCOUNTER — Ambulatory Visit: Payer: Medicaid Other | Admitting: Nutrition

## 2023-04-28 ENCOUNTER — Other Ambulatory Visit: Payer: Self-pay | Admitting: Podiatry

## 2023-05-21 ENCOUNTER — Encounter: Payer: Self-pay | Admitting: Women's Health

## 2023-05-21 ENCOUNTER — Ambulatory Visit: Payer: Medicaid Other | Admitting: Women's Health

## 2023-05-21 ENCOUNTER — Other Ambulatory Visit (HOSPITAL_COMMUNITY)
Admission: RE | Admit: 2023-05-21 | Discharge: 2023-05-21 | Disposition: A | Discharge status: Home / Self Care | Payer: Medicaid Other | Source: Ambulatory Visit | Attending: Women's Health | Admitting: Women's Health

## 2023-05-21 VITALS — BP 122/88 | HR 100 | Ht 65.0 in | Wt 270.0 lb

## 2023-05-21 DIAGNOSIS — Z01419 Encounter for gynecological examination (general) (routine) without abnormal findings: Secondary | ICD-10-CM

## 2023-05-21 DIAGNOSIS — Z30430 Encounter for insertion of intrauterine contraceptive device: Secondary | ICD-10-CM

## 2023-05-21 DIAGNOSIS — Z3046 Encounter for surveillance of implantable subdermal contraceptive: Secondary | ICD-10-CM | POA: Diagnosis not present

## 2023-05-21 DIAGNOSIS — Z1151 Encounter for screening for human papillomavirus (HPV): Secondary | ICD-10-CM | POA: Diagnosis not present

## 2023-05-21 DIAGNOSIS — Z1231 Encounter for screening mammogram for malignant neoplasm of breast: Secondary | ICD-10-CM

## 2023-05-21 LAB — POCT URINE PREGNANCY: Preg Test, Ur: NEGATIVE

## 2023-05-21 MED ORDER — FLUCONAZOLE 100 MG PO TABS: 100.0000 mg | ORAL_TABLET | Freq: Two times a day (BID) | ORAL | 0 refills | Status: DC

## 2023-05-21 MED ORDER — LEVONORGESTREL 20 MCG/DAY IU IUD
1.0000 | INTRAUTERINE_SYSTEM | Freq: Once | INTRAUTERINE | Status: AC
  Administered 2023-05-21: 1 via INTRAUTERINE

## 2023-05-21 NOTE — Patient Instructions (Signed)
NEXPLANON Keep the area clean and dry.  You can remove the big bandage in 24 hours, and the small steri-strip bandage in 3-5 days.  A back up method, such as condoms, should be used for two weeks.   IUD Nothing in vagina for 3 days (no sex, douching, tampons, etc...) Check your strings once a month to make sure you can feel them, if you are not able to please let us know If you develop a fever of 100.4 or more in the next few weeks, or if you develop severe abdominal pain, please let [place] know Use a backup method of birth control, such as condoms, for 2 weeks   Intrauterine Device Information An intrauterine device (IUD) is a medical device that is inserted into the uterus to prevent pregnancy. It is a small, T-shaped device that has one or two nylon strings hanging down from it. The strings hang out of the lower part of the uterus (cervix) to allow for future IUD removal. There are two types of IUDs: Hormone IUD. This type of IUD is made of plastic and contains the hormone progestin (synthetic progesterone). A hormone IUD may last 3-5 years. Copper IUD. This type of IUD has copper wire wrapped around it. A copper IUD may last up to 10 years. How is an IUD inserted? An IUD is inserted through the vagina, through the cervix, and into the uterus with a minor medical procedure. The procedure for IUD insertion may vary among health care providers and hospitals. How does an IUD work? Synthetic progesterone in a hormonal IUD prevents pregnancy by: Thickening cervical mucus to prevent sperm from entering the uterus. Thinning the uterine lining to prevent a fertilized egg from being implanted there. Copper in a copper IUD prevents pregnancy by making the uterus and fallopian tubes produce a fluid that kills sperm. What are the advantages of an IUD? Advantages of either type of IUD An IUD: Is highly effective in preventing pregnancy. Is reversible. You can become pregnant shortly after the IUD is  removed. Is low-maintenance and can stay in place for a long time. Has no estrogen-related side effects. Can be used when breastfeeding. Is not associated with weight gain. Can be inserted right after childbirth, an abortion, or a miscarriage. Advantages of a hormone IUD If it is inserted within 7 days of your period starting, it works right after it has been inserted. If the hormone IUD is inserted at any other time in your cycle, you will need to use a backup method of birth control for 7 days after insertion. It can make menstrual periods lighter or stop completely. It can reduce menstrual cramping and other discomforts from menstrual periods. It can be used for 3-5 years, depending on which IUD you have. Advantages of a copper IUD It works right after it is inserted. It can be used as a form of emergency birth control if it is inserted within 5 days after having unprotected sex. It does not interfere with your body's natural hormones. It can be used for up to 10 years. What are the disadvantages of an IUD? An IUD may cause irregular menstrual bleeding for a period of time after insertion. It is common to have pain during insertion and have cramping and vaginal bleeding after insertion. An IUD may cut the uterus (uterine perforation) when it is inserted. This is rare. Pelvic inflammatory disease (PID) may happen after insertion of an IUD. PID is an infection in the uterus and fallopian tubes. The  IUD does not cause the infection. The infection is usually from an unknown sexually transmitted infection (STI). This is rare, and it usually happens during the first 20 days after the IUD is inserted. A copper IUD can make your menstrual flow heavier and more painful. IUDs cannot prevent sexually transmitted infections (STIs). How is an IUD removed?  You will lie on your back with your knees bent and your feet in footrests (stirrups). A device will be inserted into your vagina to spread apart  the vaginal walls (speculum). This will allow your health care provider to see the strings attached to the IUD. Your health care provider will use a small instrument (forceps) to grasp the IUD strings and will pull firmly until the IUD is removed. You may have some discomfort when the IUD is removed. Your health care provider may recommend taking over-the-counter pain relievers, such as ibuprofen, before the procedure. You may also have minor spotting for a few days after the procedure. The procedure for IUD removal may vary among health care providers and hospitals. Is an IUD right for me? If you are interested in an IUD, discuss it with your health care provider. He or she will make sure you are a good candidate for an IUD and will let you know more about the advantages, disadvantage, and possible side effects. This will allow you to make a decision about the device. Summary An intrauterine device (IUD) is a medical device that is inserted in the uterus to prevent pregnancy. It is a small, T-shaped device that has one or two nylon strings hanging down from it. A hormone IUD contains the hormone progestin (synthetic progesterone). A copper IUD has copper wire wrapped around it. Synthetic progesterone in a hormone IUD prevents pregnancy by thickening cervical mucus and thinning the walls of the uterus. Copper in a copper IUD prevents pregnancy by making the uterus and fallopian tubes produce a fluid that kills sperm. A hormone IUD can be left in place for 3-5 years. A copper IUD can be left in place for up to 10 years. An IUD is inserted and removed by a health care provider. You may feel some pain during insertion and removal. Your health care provider may recommend taking over-the-counter pain medicine, such as ibuprofen, before an IUD procedure. This information is not intended to replace advice given to you by your health care provider. Make sure you discuss any questions you have with your health  care provider. Document Revised: 01/25/2020 Document Reviewed: 01/25/2020 Elsevier Patient Education  2024 [employer].

## 2023-05-21 NOTE — Progress Notes (Signed)
WELL-WOMAN EXAMINATION Patient name: April Carney MRN [id number]  Date of birth: [birth year] Chief Complaint:   Gynecologic Exam  History of Present Illness:   April Carney is a 42 y.o. G63P1102 Caucasian female being seen today for a routine well-woman exam.  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 Time to be removed now, discussed w/ hx only options are Mirena or Paragard IUD and w/ h/o heavy/painful periods Mirena would be best option.   PCP: Jonita Albee      does not desire labs Patient's last menstrual period was 05/07/2023. The current method of family planning is Nexplanon.  Last pap 05/14/20. Results were: NILM w/ HRHPV negative. H/O abnormal pap: no Last mammogram: never. Results were: N/A. Family h/o breast cancer: yes PAs Last colonoscopy: never. Results were: N/A. Family h/o colorectal cancer: yes PA     05/21/2023    1:58 PM 02/25/2023    8:19 AM 07/14/2022   12:39 PM 11/08/2021   12:48 PM 11/08/2021   12:46 PM  Depression screen PHQ 2/9  Decreased Interest 1 1 0 -- 1  Down, Depressed, Hopeless 1 1 0  0  PHQ - 2 Score 2 2 0  1  Altered sleeping 3 3     Tired, decreased energy 2 3     Change in appetite 2 3     Feeling bad or failure about yourself  1 3     Trouble concentrating 0 1     Moving slowly or fidgety/restless 0 0     Suicidal thoughts 0 0     PHQ-9 Score 10 15     Difficult doing work/chores  Extremely dIfficult           05/21/2023    1:58 PM 05/14/2020   10:29 AM 05/07/2018    9:35 AM 02/04/2018    7:27 PM  GAD 7 : Generalized Anxiety Score  Nervous, Anxious, on Edge 1 1  2   Control/stop worrying 2 1  3   Worry too much - different things 2 1  3   Trouble relaxing 1 0  1  Restless 0 0  1  Easily annoyed or irritable 2 2  1   Afraid - awful might happen 0 1  3  Total GAD 7 Score 8 6  14   Anxiety  Difficulty  Not difficult at all       Information is confidential and restricted. Go to Review Flowsheets to unlock data.     Review of Systems:   Pertinent items are noted in HPI Denies any headaches, blurred vision, fatigue, shortness of breath, chest pain, abdominal pain, abnormal vaginal discharge/itching/odor/irritation, problems with periods, bowel movements, urination, or intercourse unless otherwise stated above. Pertinent History Reviewed:  Reviewed past medical,surgical, social and family history.  Reviewed problem list, medications and allergies. Physical Assessment:   Vitals:   05/21/23 1351  BP: 122/88  Pulse: 100  Weight: 270 lb (122.5 kg)  Height: 5\' 5"  (1.651 m)  Body mass index is 44.93 kg/m.        Physical Examination:   General appearance - well appearing, and in no distress  Mental status - alert, oriented to person, place, and time  Psych:  She has a normal mood and affect  Skin - warm and dry, normal color, no suspicious lesions noted  Chest - effort normal, all lung fields  clear to auscultation bilaterally  Heart - normal rate and regular rhythm  Neck:  midline trachea, no thyromegaly or nodules  Breasts - breasts appear normal, no suspicious masses, no skin or nipple changes or  axillary nodes; skin candida under bilateral breasts- painted w/ gentian violet  Abdomen - soft, nontender, nondistended, no masses or organomegaly  Pelvic - skin candida bilateral groin- painted w/ gentian violet VULVA: normal appearing vulva with no masses, tenderness or lesions  VAGINA: normal appearing vagina with normal color and discharge, no lesions  CERVIX: normal appearing cervix without discharge or lesions, no CMT  Thin prep pap is done w/ HR HPV cotesting  UTERUS: uterus is felt to be normal size, shape, consistency and nontender   ADNEXA: No adnexal masses or tenderness noted.  Extremities:  No swelling or varicosities noted  Chaperone: Faith Rogue    Results  for orders placed or performed in visit on 05/21/23 (from the past 24 hour(s))  POCT urine pregnancy   Collection Time: 05/21/23  2:33 PM  Result Value Ref Range   Preg Test, Ur Negative Negative    Assessment & Plan:  1) Well-Woman Exam  2) Skin candida> breasts and groin, painted w/ gentian violet, rx diflucan  3) Nexplanon removal and IUD insertion> see notes below  Labs/procedures today: pap, nexplanon removal, IUD insertion  Mammogram:  screening mammo ordered and note routed to [facility] to schedule and notify pt , or sooner if problems Colonoscopy: @ 40yo, or sooner if problems  Orders Placed This Encounter  Procedures   MM 3D SCREENING MAMMOGRAM BILATERAL BREAST   POCT urine pregnancy    Meds:  Meds ordered this encounter  Medications   levonorgestrel (MIRENA) 20 MCG/DAY IUD 1 each   fluconazole (DIFLUCAN) 100 MG tablet    Sig: Take 1 tablet (100 mg total) by mouth in the morning and at bedtime. X 10 days    Dispense:  20 tablet    Refill:  0    Follow-up: Return in about 4 weeks (around 06/18/2023) for IUD f/u, in person.  [name] CNM, [facility] 05/21/2023 2:37 PM     NEXPLANON REMOVAL Patient name: April Carney MRN [id number]  Date of birth: 1980/11/27 Subjective Findings:   April Carney is a 42 y.o. G38P1102 Caucasian female being seen today for removal of a Nexplanon. Her Nexplanon was placed 05/25/20.  She desires removal because its time. Signed copy of informed consent in chart.   Results for orders placed or performed in visit on 05/21/23 (from the past 24 hour(s))  POCT urine pregnancy   Collection Time: 05/21/23  2:33 PM  Result Value Ref Range   Preg Test, Ur Negative Negative     Time out was performed.  Nexplanon site identified.  Area prepped in usual sterile fashon. One cc of 2% lidocaine was used to anesthetize the area at the distal end of the implant. A small stab incision was made right beside the implant on the distal  portion.  The Nexplanon rod was grasped using hemostats and removed without difficulty.  There was less than 3 cc blood loss. There were no complications.  Steri-strips were applied over the small incision and a pressure bandage was applied.  The patient tolerated the procedure well. Assessment & Plan:   1) Nexplanon removal She was instructed to keep the area clean and dry, remove pressure bandage in 24 hours, and keep insertion site covered with the steri-strip for 3-5 days.   Follow-up  PRN problems.  Orders Placed This Encounter  Procedures   MM 3D SCREENING MAMMOGRAM BILATERAL BREAST   POCT urine pregnancy    Follow-up: Return in about 4 weeks (around 06/18/2023) for IUD f/u, in person.  [name] CNM, [facility] 05/21/2023 2:37 PM     IUD INSERTION Patient name: April Carney MRN [id number]  Date of birth: [birth year] Subjective Findings:   April Carney is a 42 y.o. G41P1102 Caucasian female being seen today for insertion of a Mirena IUD.  Patient's last menstrual period was 05/07/2023.  The risks and benefits of the method and placement have been thouroughly reviewed with the patient and all questions were answered.  Specifically the patient is aware of failure rate of 07/998, expulsion of the IUD and of possible perforation.  The patient is aware of irregular bleeding due to the method and understands the incidence of irregular bleeding diminishes with time.  Signed copy of informed consent in chart.   Results for orders placed or performed in visit on 05/21/23 (from the past 24 hour(s))  POCT urine pregnancy   Collection Time: 05/21/23  2:33 PM  Result Value Ref Range   Preg Test, Ur Negative Negative     Time out was performed.  A graves speculum was placed in the vagina.  The cervix was visualized, prepped using Betadine, and grasped with a single tooth tenaculum. The uterus was found to be neutral and it sounded to 8 cm.  Mirena  IUD placed per  manufacturer's recommendations. The strings were trimmed to approximately 3 cm. The patient tolerated the procedure well.   Informal transvaginal sonogram was performed and the proper placement of the IUD was verified.  Chaperone: Faith Rogue   Assessment & Plan:   1) Mirena IUD insertion The patient was given post procedure instructions, including signs and symptoms of infection and to check for the strings after each menses or each month, and refraining from intercourse or anything in the vagina for 3 days. She was given a care card with date IUD placed, and date IUD to be removed. She is scheduled for a f/u appointment in 4 weeks.  Orders Placed This Encounter  Procedures   MM 3D SCREENING MAMMOGRAM BILATERAL BREAST   POCT urine pregnancy    Return in about 4 weeks (around 06/18/2023) for IUD f/u, in person.  [name] CNM, [facility] 05/21/2023 2:38 PM

## 2023-05-26 LAB — CYTOLOGY - PAP
Comment: NEGATIVE
Diagnosis: NEGATIVE
High risk HPV: NEGATIVE

## 2023-05-28 ENCOUNTER — Ambulatory Visit (HOSPITAL_COMMUNITY): Payer: Medicaid Other

## 2023-05-29 ENCOUNTER — Other Ambulatory Visit: Payer: Self-pay | Admitting: Podiatry

## 2023-06-10 ENCOUNTER — Encounter (HOSPITAL_COMMUNITY): Payer: Self-pay

## 2023-06-10 ENCOUNTER — Ambulatory Visit (HOSPITAL_COMMUNITY)
Admission: RE | Admit: 2023-06-10 | Discharge: 2023-06-10 | Disposition: A | Discharge status: Home / Self Care | Payer: Medicaid Other | Source: Ambulatory Visit | Attending: Women's Health | Admitting: Women's Health

## 2023-06-10 DIAGNOSIS — Z1231 Encounter for screening mammogram for malignant neoplasm of breast: Secondary | ICD-10-CM | POA: Diagnosis not present

## 2023-06-18 ENCOUNTER — Encounter: Payer: Self-pay | Admitting: Women's Health

## 2023-06-18 ENCOUNTER — Ambulatory Visit: Payer: Medicaid Other | Admitting: Women's Health

## 2023-06-18 VITALS — BP 125/88 | HR 83 | Ht 65.0 in | Wt 274.4 lb

## 2023-06-18 DIAGNOSIS — B372 Candidiasis of skin and nail: Secondary | ICD-10-CM | POA: Diagnosis not present

## 2023-06-18 DIAGNOSIS — Z30431 Encounter for routine checking of intrauterine contraceptive device: Secondary | ICD-10-CM | POA: Diagnosis not present

## 2023-06-18 MED ORDER — NYSTATIN 100000 UNIT/GM EX POWD: 1.0000 | Freq: Two times a day (BID) | CUTANEOUS | 3 refills | Status: AC

## 2023-06-18 MED ORDER — FLUCONAZOLE 100 MG PO TABS: 100.0000 mg | ORAL_TABLET | ORAL | 0 refills | Status: AC

## 2023-06-18 NOTE — Progress Notes (Signed)
GYN VISIT Patient name: April Carney MRN [id number]  Date of birth: [birth year] Chief Complaint:   Follow-up (IUD)  History of Present Illness:   April Carney is a 42 y.o. G43P1102 Caucasian female being seen today for IUD f/u. Mirena inserted 05/21/23. Doing well, on & off bleeding, no pain.  Painted skin candida (breasts and groin/vulva) last visit, rx'd diflucan bid x 10d, improved then came right back. Is diabetic, last A1C 7.0 on 9/11.   Patient's last menstrual period was 05/07/2023. The current method of family planning is IUD.  Last pap 05/21/23. Results were: NILM w/ HRHPV negative     05/21/2023    1:58 PM 02/25/2023    8:19 AM 07/14/2022   12:39 PM 11/08/2021   12:48 PM 11/08/2021   12:46 PM  Depression screen PHQ 2/9  Decreased Interest 1 1 0 -- 1  Down, Depressed, Hopeless 1 1 0  0  PHQ - 2 Score 2 2 0  1  Altered sleeping 3 3     Tired, decreased energy 2 3     Change in appetite 2 3     Feeling bad or failure about yourself  1 3     Trouble concentrating 0 1     Moving slowly or fidgety/restless 0 0     Suicidal thoughts 0 0     PHQ-9 Score 10 15     Difficult doing work/chores  Extremely dIfficult           05/21/2023    1:58 PM 05/14/2020   10:29 AM 05/07/2018    9:35 AM 02/04/2018    7:27 PM  GAD 7 : Generalized Anxiety Score  Nervous, Anxious, on Edge 1 1  2   Control/stop worrying 2 1  3   Worry too much - different things 2 1  3   Trouble relaxing 1 0  1  Restless 0 0  1  Easily annoyed or irritable 2 2  1   Afraid - awful might happen 0 1  3  Total GAD 7 Score 8 6  14   Anxiety Difficulty  Not difficult at all       Information is confidential and restricted. Go to Review Flowsheets to unlock data.     Review of Systems:   Pertinent items are noted in HPI Denies fever/chills, dizziness, headaches, visual disturbances, fatigue, shortness of breath, chest pain, abdominal pain, vomiting, abnormal vaginal discharge/itching/odor/irritation,  problems with periods, bowel movements, urination, or intercourse unless otherwise stated above.  Pertinent History Reviewed:  Reviewed past medical,surgical, social, obstetrical and family history.  Reviewed problem list, medications and allergies. Physical Assessment:   Vitals:   06/18/23 1413  BP: 125/88  Pulse: 83  Weight: 274 lb 6.4 oz (124.5 kg)  Height: 5\' 5"  (1.651 m)  Body mass index is 45.66 kg/m.       Physical Examination:   General appearance: alert, well appearing, and in no distress  Mental status: alert, oriented to person, place, and time  Skin: warm & dry   Cardiovascular: normal heart rate noted  Respiratory: normal respiratory effort, no distress  Abdomen: soft, non-tender   Pelvic: VULVA: significant candida of skin bilateral groin/vulva, under abd panus, co-exam w/ [facility], painted w/ gentian violet today VAGINA: normal appearing vagina with normal color and discharge, small amt brownish menstrual discharge, no lesions, CERVIX: normal appearing cervix without discharge or lesions, IUD strings visbile, appropriate length  Extremities: no edema   Chaperone: Federico Flake  No results found for this or any previous visit (from the past 24 hour(s)).  Assessment & Plan:  1) IUD f/u> doing well, bleeding on & off, discussed normal bleeding w/ IUD, if doesn't improve and wants megace let us know  2) Significant candida of skin of bilateral groin/vulva/under abd panus> painted w/ gentian violet, per [facility] rx diflucan 100mg  every other day x , nystatin powder, can buy otc gentian violet and apply herself at home, f/u  Meds:  Meds ordered this encounter  Medications   fluconazole (DIFLUCAN) 100 MG tablet    Sig: Take 1 tablet (100 mg total) by mouth every other day. X1 month    Dispense:  15 tablet    Refill:  0   nystatin (MYCOSTATIN/NYSTOP) powder    Sig: Apply 1 Application topically 2 (two) times daily.    Dispense:  15 g    Refill:  3    No orders of  the defined types were placed in this encounter.   Return in about 4 weeks (around 07/16/2023) for gyn f/u in office w/ Dr. Despina Hidden only.  [name] CNM, [facility] 06/18/2023 2:45 PM

## 2023-07-15 ENCOUNTER — Ambulatory Visit: Payer: Medicaid Other

## 2023-07-16 ENCOUNTER — Ambulatory Visit: Payer: Medicaid Other | Admitting: Obstetrics & Gynecology

## 2023-10-18 ENCOUNTER — Encounter: Payer: Self-pay | Admitting: Women's Health

## 2023-10-19 ENCOUNTER — Other Ambulatory Visit: Payer: Self-pay | Admitting: Women's Health

## 2023-10-19 MED ORDER — MEGESTROL ACETATE 40 MG PO TABS: ORAL_TABLET | ORAL | 1 refills | Status: AC

## 2023-10-28 ENCOUNTER — Encounter: Payer: Self-pay | Admitting: *Deleted

## 2023-11-03 DIAGNOSIS — E785 Hyperlipidemia, unspecified: Secondary | ICD-10-CM | POA: Diagnosis not present

## 2023-11-03 DIAGNOSIS — Z8673 Personal history of transient ischemic attack (TIA), and cerebral infarction without residual deficits: Secondary | ICD-10-CM | POA: Diagnosis not present

## 2023-11-03 DIAGNOSIS — F32A Depression, unspecified: Secondary | ICD-10-CM | POA: Diagnosis not present

## 2023-11-03 DIAGNOSIS — E119 Type 2 diabetes mellitus without complications: Secondary | ICD-10-CM | POA: Diagnosis not present

## 2023-11-03 DIAGNOSIS — E1169 Type 2 diabetes mellitus with other specified complication: Secondary | ICD-10-CM | POA: Diagnosis not present

## 2023-11-03 DIAGNOSIS — M109 Gout, unspecified: Secondary | ICD-10-CM | POA: Diagnosis not present

## 2023-11-03 DIAGNOSIS — I10 Essential (primary) hypertension: Secondary | ICD-10-CM | POA: Diagnosis not present

## 2023-11-04 ENCOUNTER — Ambulatory Visit (INDEPENDENT_AMBULATORY_CARE_PROVIDER_SITE_OTHER)

## 2023-11-04 ENCOUNTER — Ambulatory Visit: Admission: EM | Admit: 2023-11-04 | Discharge: 2023-11-04 | Disposition: A | Discharge status: Home / Self Care

## 2023-11-04 ENCOUNTER — Other Ambulatory Visit: Payer: Self-pay

## 2023-11-04 ENCOUNTER — Encounter: Payer: Self-pay | Admitting: Emergency Medicine

## 2023-11-04 DIAGNOSIS — M25571 Pain in right ankle and joints of right foot: Secondary | ICD-10-CM | POA: Diagnosis not present

## 2023-11-04 NOTE — ED Provider Notes (Signed)
 [facility]    CSN: [id number] Arrival date & time: 11/04/23  1442      History   Chief Complaint Chief Complaint  Patient presents with   Ankle Pain    HPI April Carney is a 43 y.o. female.   Patient presents today for ankle pain to the top of the right ankle for the past 3 days.  Only has pain when she bears weight.  No swelling, bruising, obvious deformity, or redness.  Has tried Tylenol which does seem to help with the pain temporarily.  No history of surgeries or trauma to the area.  No recent fall or injury.  No numbness or tingling in the toes.  No new exercise program.    Past Medical History:  Diagnosis Date   Anticoagulant long-term use    eliquis--- managed by pcp  (hx PEs)   Chronic gout    followed by pcp--- pt stated first and last flare-up 05/ 2023  left great toe   History of CVA (cerebrovascular accident) without residual deficits 07/09/2020   neurologist--- sethi;  hospital admission in epic-- dx acute right MCA branch infarct of cryptogenic etiology in setting positive covid (dx 06-27-2022) with multifocal pneumonia, and bilateral upper / lower lobes PEs ( related to hypercoagulabiliy and on birth control pills) ;  treated w/ IV tPA, no residuals and heparin then eliquis//   negative transcranial bubble study   History of iron deficiency anemia    due to DUB   History of pulmonary embolus (PE) 07/09/2020   bilateral upper / lower lobes -- in setting positive covid w/ pneumonia (taking birth control pills)//  pt had negative work-up done for blood disorders during admission//  hematology [facility] Seth Bake. Shellia Carwin 09-10-2021 note)  recommended long term anticoaglate use   Hyperlipidemia    Hypertension    Hypothyroidism    followed by pcp   MDD (major depressive disorder)    PTSD (post-traumatic stress disorder)    Type 2 diabetes mellitus treated with insulin (HCC)    followed by pcp   (02-18-2022  pt stated checks blood sugar BID,  fasting  blood sugar-- 120s)   Wears glasses     Patient Active Problem List   Diagnosis Date Noted   Encounter for IUD insertion 05/21/2023   Bunion, left 03/15/2022   History of CVA (cerebrovascular accident) 07/19/2020   History of pulmonary embolus (PE) 07/19/2020   Iron deficiency anemia due to chronic blood loss 07/19/2020   COVID-19    Stroke (cerebrum) (HCC) 07/09/2020   CVA (cerebral vascular accident) (HCC) 07/09/2020   Cerebrovascular accident (CVA) (HCC) 07/09/2020   Anemia 08/22/2019   Submandibular sialoadenitis 07/15/2018   Elevated liver function tests 06/02/2017   Superficial fungus infection of skin 07/01/2016   Essential hypertension 07/01/2016   Diabetes mellitus (HCC) 12/12/2015   Hyperlipidemia 12/12/2015   Situational depression 12/11/2015   Family h/o Biliary atresia 04/24/2015   History of preterm delivery 04/24/2015   History of cesarean section 04/24/2015   Obesity 04/24/2015   Hypothyroidism 04/24/2015    Past Surgical History:  Procedure Laterality Date   CESAREAN SECTION  02/16/2012   @[facility]    HALLUX VALGUS AUSTIN Left 02/26/2022   Procedure: HALLUX VALGUS AUSTIN;  Surgeon: Vivi Barrack, DPM;  Location: [facility];  Service: Podiatry;  Laterality: Left;    OB History     Gravida  2   Para  2   Term  1   Preterm  1   AB      Living  2      SAB      IAB      Ectopic      Multiple      Live Births  2            Home Medications    Prior to Admission medications   Medication Sig Start Date End Date Taking? Authorizing Provider  VICTOZA 18 MG/3ML SOPN Inject into the skin. 11/03/23 11/02/24 Yes [provider]  Accu-Chek FastClix Lancets MISC Use to check blood glucose twice a day 01/02/20   Wandra Feinstein, MD  acetaminophen (TYLENOL) 325 MG tablet Take 650 mg by mouth every 6 (six) hours as needed.     [provider]  allopurinol (ZYLOPRIM) 100 MG tablet Take 1 tablet by mouth once daily 05/29/23    McCaughan, Dia D, DPM  apixaban (ELIQUIS) 5 MG TABS tablet Take 1 tablet (5 mg total) by mouth 2 (two) times daily. 04/01/22 05/20/24  Ihor Austin, NP  Ascorbic Acid (VITAMIN C) 100 MG tablet Take 100 mg by mouth daily.    [provider]  atorvastatin (LIPITOR) 40 MG tablet Take 1 tablet by mouth once daily Patient taking differently: Take 40 mg by mouth daily. 01/20/22   Ihor Austin, NP  blood glucose meter kit and supplies KIT Dispense based on patient and insurance preference. Use up to four times daily as directed. (FOR ICD-9 250.00, 250.01). 05/31/19   Corum, Minerva Fester, MD  cephALEXin (KEFLEX) 500 MG capsule Take 1 capsule (500 mg total) by mouth 3 (three) times daily. Patient not taking: Reported on 02/25/2023 02/26/22   Vivi Barrack, DPM  cetirizine (ZYRTEC) 10 MG tablet Take 10 mg by mouth daily.    [provider]  diphenhydrAMINE (BENADRYL) 25 MG tablet Take 25 mg by mouth at bedtime as needed for sleep.    [provider]  etonogestrel (NEXPLANON) 68 MG IMPL implant 1 each by Subdermal route once. Patient not taking: Reported on 06/18/2023    [provider]  fluconazole (DIFLUCAN) 100 MG tablet Take 1 tablet (100 mg total) by mouth every other day. X1 month 06/18/23   [name], CNM  FLUoxetine (PROZAC) 20 MG capsule Take three pills each morning as directed. Patient taking differently: Take 20 mg by mouth daily. 04/14/18   Aliene Beams, MD  fluticasone (FLONASE) 50 MCG/ACT nasal spray Place 2 sprays into both nostrils daily. 07/14/22   Leath-Warren, Sadie Haber, NP  glucose blood (ACCU-CHEK GUIDE) test strip Use to check glucose twice a day 01/02/20   Wandra Feinstein, MD  levothyroxine (SYNTHROID) 175 MCG tablet Take 175 mcg by mouth daily.    [provider]  lisinopril (ZESTRIL) 10 MG tablet Take 20 mg by mouth at bedtime.    [provider]  megestrol (MEGACE) 40 MG tablet 3x5d, 2x5d, then 1 daily to help control  vaginal bleeding. Stop taking when bleeding stops. 10/19/23   [name], CNM  metFORMIN (GLUCOPHAGE) 1000 MG tablet Take 1,000 mg by mouth 2 (two) times daily with a meal.    [provider]  nystatin (MYCOSTATIN/NYSTOP) powder Apply 1 Application topically 2 (two) times daily. 06/18/23   [name], CNM  promethazine (PHENERGAN) 25 MG tablet Take 1 tablet (25 mg total) by mouth every 8 (eight) hours as needed for nausea or vomiting. 02/26/22   Vivi Barrack, DPM  zinc gluconate 50  MG tablet Take 50 mg by mouth daily.    [provider]    Family History Family History  Problem Relation Age of Onset   Diabetes Mother    Hypertension Mother    Hyperlipidemia Mother    Kidney disease Mother    Stroke Mother    Diabetes Father    Heart disease Father    Breast cancer Paternal Aunt    Cancer Paternal Aunt    Liver disease Son        biliary atresia    Asthma Son    ADD / ADHD Son    ODD Son    Asthma Son    ADD / ADHD Son    ODD Son    Colon cancer Neg Hx     Social History Social History   Tobacco Use   Smoking status: Former    Types: Cigarettes   Smokeless tobacco: Never  Vaping Use   Vaping status: Never Used  Substance Use Topics   Alcohol use: Yes    Comment: rare   Drug use: Never     Allergies   Ammonium-containing compounds   Review of Systems Review of Systems Per HPI  Physical Exam Triage Vital Signs ED Triage Vitals  Encounter Vitals Group     BP 11/04/23 1447 121/88     Systolic BP Percentile --      Diastolic BP Percentile --      Pulse Rate 11/04/23 1447 81     Resp 11/04/23 1447 20     Temp 11/04/23 1447 97.6 F (36.4 C)     Temp Source 11/04/23 1447 Oral     SpO2 11/04/23 1447 96 %     Weight --      Height --      Head Circumference --      Peak Flow --      Pain Score 11/04/23 1449 4     Pain Loc --      Pain Education --      Exclude from Growth Chart --    No data found.  Updated Vital  Signs BP 121/88 (BP Location: Right Arm)   Pulse 81   Temp 97.6 F (36.4 C) (Oral)   Resp 20   LMP 09/26/2023   SpO2 96%   Visual Acuity Right Eye Distance:   Left Eye Distance:   Bilateral Distance:    Right Eye Near:   Left Eye Near:    Bilateral Near:     Physical Exam Vitals and nursing note reviewed.  Constitutional:      General: She is not in acute distress.    Appearance: Normal appearance. She is not toxic-appearing.  HENT:     Mouth/Throat:     Mouth: Mucous membranes are moist.     Pharynx: Oropharynx is clear.  Pulmonary:     Effort: Pulmonary effort is normal. No respiratory distress.  Musculoskeletal:     Comments: Inspection: no swelling, bruising, obvious deformity or redness to right ankle Palpation: right ankle nontender to palpation diffusely; no obvious deformities palpated ROM: Full ROM to right ankle and flexibility of foot Strength: 5/5 bilateral lower extremities Neurovascular: neurovascularly intact in bilateral lower extremities   Skin:    General: Skin is warm and dry.     Capillary Refill: Capillary refill takes less than 2 seconds.     Coloration: Skin is not jaundiced or pale.     Findings: No erythema.  Neurological:  Mental Status: She is alert and oriented to person, place, and time.  Psychiatric:        Behavior: Behavior is cooperative.      UC Treatments / Results  Labs (all labs ordered are listed, but only abnormal results are displayed) Labs Reviewed - No data to display  EKG   Radiology DG Ankle Complete Right Result Date: 11/04/2023 CLINICAL DATA:  Right ankle pain with ambulation.  No known injury. EXAM: RIGHT ANKLE - COMPLETE 3+ VIEW COMPARISON:  None Available. FINDINGS: There is no evidence of fracture, dislocation, or joint effusion. The ankle mortise is preserved. Mild talonavicular degenerative spurring. No erosions. Small Achilles tendon enthesophyte. Soft tissues are unremarkable. IMPRESSION: 1. Mild  talonavicular degenerative spurring. 2. Small Achilles tendon enthesophyte. Electronically Signed   By: Narda Rutherford M.D.   On: 11/04/2023 17:34    Procedures Procedures (including critical care time)  Medications Ordered in UC Medications - No data to display  Initial Impression / Assessment and Plan / UC Course  I have reviewed the triage vital signs and the nursing notes.  Pertinent labs & imaging results that were available during my care of the patient were reviewed by me and considered in my medical decision making (see chart for details).   Patient is well-appearing, normotensive, afebrile, not tachycardic, not tachypneic, oxygenating well on room air.    1. Acute right ankle pain Suspect possible tendinitis X-ray imaging pending at time of discharge; will contact patient with abnormal results only In the meantime, continue Tylenol and start ASO lace up ankle brace, ice, elevation, rest Return and ER precautions discussed with patient  Update: X-ray imaging shows talonavicular degenerative spurring, likely osteoarthritis.  I called patient and explained results.  Achilles tendon enthesophyte is likely an incidental finding.  No change to treatment plan from earlier today; recommended follow-up with PCP and/or podiatry if symptoms do not improve with discuss treatment.  Patient verbalized understanding.  All questions answered to the best my ability.  The patient was given the opportunity to ask questions.  All questions answered to their satisfaction.  The patient is in agreement to this plan.   Final Clinical Impressions(s) / UC Diagnoses   Final diagnoses:  Acute right ankle pain     Discharge Instructions      We will contact you with abnormal results from the x-ray today only.  If you do not hear anything from [place], that means the x-ray is normal.  As we discussed, I suspect you may have tendinitis in your ankle.  Wear the lace up ankle brace for the next few days.   Recommend rest, ice, elevation, and continue Tylenol as needed for pain.  If pain and symptoms do not improve with treatment, recommend follow-up with sports medicine-contact information has been provided for you to call and make an appointment.     ED Prescriptions   None    PDMP not reviewed this encounter.   Valentino Nose, NP 11/04/23 [phone]

## 2023-11-04 NOTE — Discharge Instructions (Addendum)
 We will contact you with abnormal results from the x-ray today only.  If you do not hear anything from [place], that means the x-ray is normal.  As we discussed, I suspect you may have tendinitis in your ankle.  Wear the lace up ankle brace for the next few days.  Recommend rest, ice, elevation, and continue Tylenol as needed for pain.  If pain and symptoms do not improve with treatment, recommend follow-up with sports medicine-contact information has been provided for you to call and make an appointment.

## 2023-11-04 NOTE — ED Triage Notes (Addendum)
 Pt reports pain to top of right ankle x3 days. Denies any known injury. Pt reports pain increases with movement of right ankle.

## 2024-02-29 DIAGNOSIS — E785 Hyperlipidemia, unspecified: Secondary | ICD-10-CM | POA: Diagnosis not present

## 2024-02-29 DIAGNOSIS — E119 Type 2 diabetes mellitus without complications: Secondary | ICD-10-CM | POA: Diagnosis not present

## 2024-02-29 DIAGNOSIS — E039 Hypothyroidism, unspecified: Secondary | ICD-10-CM | POA: Diagnosis not present

## 2024-02-29 DIAGNOSIS — Z794 Long term (current) use of insulin: Secondary | ICD-10-CM | POA: Diagnosis not present
